# Patient Record
Sex: Female | Born: 1970 | Hispanic: No | Marital: Married | Smoking: Never smoker
Health system: Southern US, Community
[De-identification: ages and names within clinical notes are randomized; demographics above are authoritative.]

## PROBLEM LIST (undated history)

## (undated) DIAGNOSIS — E785 Hyperlipidemia, unspecified: Secondary | ICD-10-CM

## (undated) DIAGNOSIS — I2510 Atherosclerotic heart disease of native coronary artery without angina pectoris: Secondary | ICD-10-CM

## (undated) DIAGNOSIS — G4730 Sleep apnea, unspecified: Secondary | ICD-10-CM

## (undated) DIAGNOSIS — I10 Essential (primary) hypertension: Secondary | ICD-10-CM

## (undated) DIAGNOSIS — E041 Nontoxic single thyroid nodule: Secondary | ICD-10-CM

## (undated) DIAGNOSIS — T7840XA Allergy, unspecified, initial encounter: Secondary | ICD-10-CM

## (undated) DIAGNOSIS — I214 Non-ST elevation (NSTEMI) myocardial infarction: Secondary | ICD-10-CM

## (undated) DIAGNOSIS — Z95820 Peripheral vascular angioplasty status with implants and grafts: Secondary | ICD-10-CM

## (undated) HISTORY — DX: Allergy, unspecified, initial encounter: T78.40XA

## (undated) HISTORY — DX: Essential (primary) hypertension: I10

## (undated) HISTORY — DX: Sleep apnea, unspecified: G47.30

## (undated) HISTORY — DX: Hyperlipidemia, unspecified: E78.5

---

## 2003-04-15 ENCOUNTER — Encounter (INDEPENDENT_AMBULATORY_CARE_PROVIDER_SITE_OTHER): Payer: Self-pay | Admitting: Internal Medicine

## 2005-05-05 ENCOUNTER — Ambulatory Visit: Payer: Self-pay | Admitting: Internal Medicine

## 2005-05-11 ENCOUNTER — Encounter (INDEPENDENT_AMBULATORY_CARE_PROVIDER_SITE_OTHER): Payer: Self-pay | Admitting: Internal Medicine

## 2005-05-15 ENCOUNTER — Encounter (INDEPENDENT_AMBULATORY_CARE_PROVIDER_SITE_OTHER): Payer: Self-pay | Admitting: Internal Medicine

## 2005-05-15 LAB — CONVERTED CEMR LAB
BUN: 15 mg/dL
CO2: 23 meq/L
Chloride: 101 meq/L
HDL: 58 mg/dL
Potassium: 4.5 meq/L
Total CHOL/HDL Ratio: 4.4
Triglycerides: 169 mg/dL
VLDL: 34 mg/dL

## 2005-05-19 ENCOUNTER — Ambulatory Visit: Payer: Self-pay | Admitting: Internal Medicine

## 2005-06-16 ENCOUNTER — Ambulatory Visit: Payer: Self-pay | Admitting: Internal Medicine

## 2005-09-22 ENCOUNTER — Ambulatory Visit: Payer: Self-pay | Admitting: Internal Medicine

## 2005-12-08 ENCOUNTER — Encounter (INDEPENDENT_AMBULATORY_CARE_PROVIDER_SITE_OTHER): Payer: Self-pay | Admitting: Pathology

## 2005-12-08 ENCOUNTER — Ambulatory Visit: Payer: Self-pay | Admitting: Internal Medicine

## 2005-12-08 ENCOUNTER — Other Ambulatory Visit: Admission: RE | Admit: 2005-12-08 | Discharge: 2005-12-08 | Payer: Self-pay | Admitting: Internal Medicine

## 2006-01-29 ENCOUNTER — Ambulatory Visit: Payer: Self-pay | Admitting: Internal Medicine

## 2006-03-05 ENCOUNTER — Encounter: Payer: Self-pay | Admitting: Internal Medicine

## 2006-03-05 DIAGNOSIS — I10 Essential (primary) hypertension: Secondary | ICD-10-CM | POA: Insufficient documentation

## 2006-03-05 DIAGNOSIS — K589 Irritable bowel syndrome without diarrhea: Secondary | ICD-10-CM

## 2006-03-05 DIAGNOSIS — N6019 Diffuse cystic mastopathy of unspecified breast: Secondary | ICD-10-CM

## 2006-08-02 ENCOUNTER — Encounter (INDEPENDENT_AMBULATORY_CARE_PROVIDER_SITE_OTHER): Payer: Self-pay | Admitting: Internal Medicine

## 2006-08-06 ENCOUNTER — Ambulatory Visit: Payer: Self-pay | Admitting: Internal Medicine

## 2006-08-06 DIAGNOSIS — G4700 Insomnia, unspecified: Secondary | ICD-10-CM | POA: Insufficient documentation

## 2006-08-06 DIAGNOSIS — R519 Headache, unspecified: Secondary | ICD-10-CM | POA: Insufficient documentation

## 2006-08-06 DIAGNOSIS — R51 Headache: Secondary | ICD-10-CM

## 2006-08-31 ENCOUNTER — Ambulatory Visit: Payer: Self-pay | Admitting: Internal Medicine

## 2006-08-31 DIAGNOSIS — R0789 Other chest pain: Secondary | ICD-10-CM | POA: Insufficient documentation

## 2006-09-01 ENCOUNTER — Encounter (INDEPENDENT_AMBULATORY_CARE_PROVIDER_SITE_OTHER): Payer: Self-pay | Admitting: Internal Medicine

## 2006-09-04 LAB — CONVERTED CEMR LAB
Calcium: 9.3 mg/dL (ref 8.4–10.5)
Creatinine, Ser: 0.79 mg/dL (ref 0.40–1.20)
HDL: 54 mg/dL (ref >39)
Sodium: 139 meq/L (ref 135–145)
Total CHOL/HDL Ratio: 4.2
Triglycerides: 186 mg/dL — ABNORMAL HIGH (ref <150)

## 2006-09-10 ENCOUNTER — Ambulatory Visit (HOSPITAL_COMMUNITY): Admission: RE | Admit: 2006-09-10 | Discharge: 2006-09-10 | Payer: Self-pay | Admitting: Internal Medicine

## 2006-09-11 ENCOUNTER — Ambulatory Visit: Payer: Self-pay | Admitting: Cardiology

## 2006-09-28 ENCOUNTER — Telehealth (INDEPENDENT_AMBULATORY_CARE_PROVIDER_SITE_OTHER): Payer: Self-pay | Admitting: *Deleted

## 2006-10-01 ENCOUNTER — Encounter (INDEPENDENT_AMBULATORY_CARE_PROVIDER_SITE_OTHER): Payer: Self-pay | Admitting: Internal Medicine

## 2006-10-01 ENCOUNTER — Encounter (INDEPENDENT_AMBULATORY_CARE_PROVIDER_SITE_OTHER): Payer: Self-pay | Admitting: Family Medicine

## 2006-10-03 ENCOUNTER — Telehealth (INDEPENDENT_AMBULATORY_CARE_PROVIDER_SITE_OTHER): Payer: Self-pay | Admitting: *Deleted

## 2006-10-03 ENCOUNTER — Encounter (INDEPENDENT_AMBULATORY_CARE_PROVIDER_SITE_OTHER): Payer: Self-pay | Admitting: Internal Medicine

## 2006-10-17 ENCOUNTER — Telehealth (INDEPENDENT_AMBULATORY_CARE_PROVIDER_SITE_OTHER): Payer: Self-pay | Admitting: Internal Medicine

## 2006-10-24 ENCOUNTER — Encounter (INDEPENDENT_AMBULATORY_CARE_PROVIDER_SITE_OTHER): Payer: Self-pay | Admitting: Internal Medicine

## 2006-10-25 ENCOUNTER — Encounter (INDEPENDENT_AMBULATORY_CARE_PROVIDER_SITE_OTHER): Payer: Self-pay | Admitting: Family Medicine

## 2006-12-26 ENCOUNTER — Telehealth (INDEPENDENT_AMBULATORY_CARE_PROVIDER_SITE_OTHER): Payer: Self-pay | Admitting: Internal Medicine

## 2007-01-16 ENCOUNTER — Ambulatory Visit: Payer: Self-pay | Admitting: Internal Medicine

## 2007-01-16 ENCOUNTER — Telehealth (INDEPENDENT_AMBULATORY_CARE_PROVIDER_SITE_OTHER): Payer: Self-pay | Admitting: *Deleted

## 2007-01-21 ENCOUNTER — Telehealth (INDEPENDENT_AMBULATORY_CARE_PROVIDER_SITE_OTHER): Payer: Self-pay | Admitting: Internal Medicine

## 2007-01-21 ENCOUNTER — Encounter (INDEPENDENT_AMBULATORY_CARE_PROVIDER_SITE_OTHER): Payer: Self-pay | Admitting: Internal Medicine

## 2007-02-04 ENCOUNTER — Telehealth (INDEPENDENT_AMBULATORY_CARE_PROVIDER_SITE_OTHER): Payer: Self-pay | Admitting: *Deleted

## 2007-03-27 ENCOUNTER — Encounter (INDEPENDENT_AMBULATORY_CARE_PROVIDER_SITE_OTHER): Payer: Self-pay | Admitting: Internal Medicine

## 2007-04-24 ENCOUNTER — Ambulatory Visit: Payer: Self-pay | Admitting: Internal Medicine

## 2007-04-24 LAB — CONVERTED CEMR LAB
Bilirubin Urine: NEGATIVE
Blood in Urine, dipstick: NEGATIVE
Glucose, Urine, Semiquant: NEGATIVE
Ketones, urine, test strip: NEGATIVE
Nitrite: NEGATIVE
Specific Gravity, Urine: 1.02
Urobilinogen, UA: 0.2
pH: 7

## 2007-04-25 ENCOUNTER — Telehealth (INDEPENDENT_AMBULATORY_CARE_PROVIDER_SITE_OTHER): Payer: Self-pay | Admitting: *Deleted

## 2007-04-25 LAB — CONVERTED CEMR LAB
BUN: 11 mg/dL (ref 6–23)
CO2: 23 meq/L (ref 19–32)
Chloride: 103 meq/L (ref 96–112)
Creatinine, Ser: 0.59 mg/dL (ref 0.40–1.20)
Glucose, Bld: 75 mg/dL (ref 70–99)
HDL: 55 mg/dL (ref >39)
LDL Cholesterol: 120 mg/dL — ABNORMAL HIGH (ref 0–99)

## 2007-10-31 ENCOUNTER — Ambulatory Visit: Payer: Self-pay | Admitting: Family Medicine

## 2007-10-31 DIAGNOSIS — H60399 Other infective otitis externa, unspecified ear: Secondary | ICD-10-CM | POA: Insufficient documentation

## 2008-03-26 ENCOUNTER — Telehealth (INDEPENDENT_AMBULATORY_CARE_PROVIDER_SITE_OTHER): Payer: Self-pay | Admitting: Internal Medicine

## 2008-03-30 ENCOUNTER — Ambulatory Visit: Payer: Self-pay | Admitting: Internal Medicine

## 2008-05-07 ENCOUNTER — Encounter (INDEPENDENT_AMBULATORY_CARE_PROVIDER_SITE_OTHER): Payer: Self-pay | Admitting: Internal Medicine

## 2008-07-15 ENCOUNTER — Ambulatory Visit: Payer: Self-pay | Admitting: Internal Medicine

## 2008-07-15 ENCOUNTER — Telehealth (INDEPENDENT_AMBULATORY_CARE_PROVIDER_SITE_OTHER): Payer: Self-pay | Admitting: Internal Medicine

## 2008-07-15 DIAGNOSIS — R609 Edema, unspecified: Secondary | ICD-10-CM | POA: Insufficient documentation

## 2008-07-15 LAB — CONVERTED CEMR LAB
Glucose, Urine, Semiquant: NEGATIVE
Specific Gravity, Urine: 1.02
pH: 7

## 2008-07-16 LAB — CONVERTED CEMR LAB
ALT: 48 units/L — ABNORMAL HIGH (ref 0–35)
AST: 40 units/L — ABNORMAL HIGH (ref 0–37)
Calcium: 9.8 mg/dL (ref 8.4–10.5)
Chloride: 103 meq/L (ref 96–112)
Creatinine, Ser: 0.6 mg/dL (ref 0.40–1.20)
Sodium: 139 meq/L (ref 135–145)
Total Bilirubin: 0.5 mg/dL (ref 0.3–1.2)

## 2008-09-09 ENCOUNTER — Encounter (INDEPENDENT_AMBULATORY_CARE_PROVIDER_SITE_OTHER): Payer: Self-pay | Admitting: Internal Medicine

## 2008-09-10 ENCOUNTER — Encounter (INDEPENDENT_AMBULATORY_CARE_PROVIDER_SITE_OTHER): Payer: Self-pay | Admitting: Internal Medicine

## 2008-09-10 DIAGNOSIS — R944 Abnormal results of kidney function studies: Secondary | ICD-10-CM

## 2008-09-10 LAB — CONVERTED CEMR LAB
Creatinine 24 HR UR: 234 mg/24hr — ABNORMAL LOW (ref 700–1800)
Creatinine Clearance: 23 mL/min — ABNORMAL LOW (ref 75–115)

## 2008-09-16 ENCOUNTER — Encounter (INDEPENDENT_AMBULATORY_CARE_PROVIDER_SITE_OTHER): Payer: Self-pay | Admitting: Internal Medicine

## 2008-09-23 ENCOUNTER — Ambulatory Visit (HOSPITAL_COMMUNITY): Admission: RE | Admit: 2008-09-23 | Discharge: 2008-09-23 | Payer: Self-pay | Admitting: Nephrology

## 2008-12-17 ENCOUNTER — Encounter (INDEPENDENT_AMBULATORY_CARE_PROVIDER_SITE_OTHER): Payer: Self-pay | Admitting: Internal Medicine

## 2009-02-08 ENCOUNTER — Ambulatory Visit: Payer: Self-pay | Admitting: Family Medicine

## 2009-02-08 DIAGNOSIS — G4730 Sleep apnea, unspecified: Secondary | ICD-10-CM | POA: Insufficient documentation

## 2009-02-08 DIAGNOSIS — E669 Obesity, unspecified: Secondary | ICD-10-CM | POA: Insufficient documentation

## 2009-02-08 DIAGNOSIS — F329 Major depressive disorder, single episode, unspecified: Secondary | ICD-10-CM

## 2009-02-09 ENCOUNTER — Telehealth: Payer: Self-pay | Admitting: Family Medicine

## 2009-03-10 ENCOUNTER — Ambulatory Visit: Payer: Self-pay | Admitting: Family Medicine

## 2009-03-12 LAB — CONVERTED CEMR LAB
CO2: 23 meq/L (ref 19–32)
Chloride: 102 meq/L (ref 96–112)
HDL: 56 mg/dL (ref >39)
LDL Cholesterol: 117 mg/dL — ABNORMAL HIGH (ref 0–99)
Lymphs Abs: 2.9 10*3/uL (ref 0.7–4.0)
Monocytes Relative: 6 % (ref 3–12)
Neutro Abs: 7 10*3/uL (ref 1.7–7.7)
Neutrophils Relative %: 64 % (ref 43–77)
RBC: 4.87 M/uL (ref 3.87–5.11)
Sodium: 138 meq/L (ref 135–145)
TSH: 2.644 microintl units/mL (ref 0.350–4.500)
Total CHOL/HDL Ratio: 3.8
WBC: 10.9 10*3/uL — ABNORMAL HIGH (ref 4.0–10.5)

## 2009-03-15 ENCOUNTER — Encounter (INDEPENDENT_AMBULATORY_CARE_PROVIDER_SITE_OTHER): Payer: Self-pay | Admitting: Internal Medicine

## 2009-03-15 ENCOUNTER — Ambulatory Visit (HOSPITAL_COMMUNITY): Admission: RE | Admit: 2009-03-15 | Discharge: 2009-03-15 | Payer: Self-pay | Admitting: Family Medicine

## 2009-03-21 DIAGNOSIS — E785 Hyperlipidemia, unspecified: Secondary | ICD-10-CM | POA: Insufficient documentation

## 2009-03-22 ENCOUNTER — Ambulatory Visit: Payer: Self-pay | Admitting: Family Medicine

## 2009-04-29 ENCOUNTER — Ambulatory Visit: Payer: Self-pay | Admitting: Family Medicine

## 2009-04-29 ENCOUNTER — Other Ambulatory Visit: Admission: RE | Admit: 2009-04-29 | Discharge: 2009-04-29 | Payer: Self-pay | Admitting: Family Medicine

## 2009-05-03 DIAGNOSIS — T8339XA Other mechanical complication of intrauterine contraceptive device, initial encounter: Secondary | ICD-10-CM | POA: Insufficient documentation

## 2009-05-18 ENCOUNTER — Ambulatory Visit: Payer: Self-pay | Admitting: Family Medicine

## 2009-05-18 DIAGNOSIS — J018 Other acute sinusitis: Secondary | ICD-10-CM

## 2009-09-08 ENCOUNTER — Encounter: Payer: Self-pay | Admitting: Family Medicine

## 2009-09-22 ENCOUNTER — Encounter: Payer: Self-pay | Admitting: Family Medicine

## 2009-09-23 LAB — CONVERTED CEMR LAB
AST: 42 units/L — ABNORMAL HIGH (ref 0–37)
Alkaline Phosphatase: 87 units/L (ref 39–117)
BUN: 15 mg/dL (ref 6–23)
Basophils Relative: 1 % (ref 0–1)
Bilirubin, Direct: 0.1 mg/dL (ref 0.0–0.3)
CO2: 24 meq/L (ref 19–32)
Calcium: 9.7 mg/dL (ref 8.4–10.5)
Creatinine, Ser: 0.73 mg/dL (ref 0.40–1.20)
Eosinophils Absolute: 0.3 10*3/uL (ref 0.0–0.7)
Eosinophils Relative: 2 % (ref 0–5)
Glucose, Bld: 92 mg/dL (ref 70–99)
HDL: 59 mg/dL (ref >39)
Indirect Bilirubin: 0.6 mg/dL (ref 0.0–0.9)
MCHC: 33.3 g/dL (ref 30.0–36.0)
MCV: 86.8 fL (ref 78.0–100.0)
Monocytes Relative: 5 % (ref 3–12)
Neutrophils Relative %: 68 % (ref 43–77)
RBC: 5.09 M/uL (ref 3.87–5.11)
Total Bilirubin: 0.7 mg/dL (ref 0.3–1.2)

## 2009-09-30 ENCOUNTER — Ambulatory Visit: Payer: Self-pay | Admitting: Family Medicine

## 2009-09-30 DIAGNOSIS — R748 Abnormal levels of other serum enzymes: Secondary | ICD-10-CM | POA: Insufficient documentation

## 2009-10-05 LAB — CONVERTED CEMR LAB
BUN: 16 mg/dL (ref 6–23)
CO2: 26 meq/L (ref 19–32)
Calcium: 9.9 mg/dL (ref 8.4–10.5)
Chloride: 99 meq/L (ref 96–112)
Creatinine, Ser: 0.7 mg/dL (ref 0.40–1.20)
Glucose, Bld: 89 mg/dL (ref 70–99)

## 2009-12-21 ENCOUNTER — Encounter: Payer: Self-pay | Admitting: Family Medicine

## 2010-01-24 ENCOUNTER — Ambulatory Visit: Payer: Self-pay | Admitting: Family Medicine

## 2010-04-27 ENCOUNTER — Ambulatory Visit: Admit: 2010-04-27 | Payer: Self-pay | Admitting: Family Medicine

## 2010-04-27 ENCOUNTER — Encounter: Payer: Self-pay | Admitting: Family Medicine

## 2010-05-01 ENCOUNTER — Encounter: Payer: Self-pay | Admitting: Family Medicine

## 2010-05-10 NOTE — Miscellaneous (Signed)
  Clinical Lists Changes  Medications: Removed medication of DIOVAN HCT 320-25 MG TABS (VALSARTAN-HYDROCHLOROTHIAZIDE) Take 1 tablet by mouth once a day Added new medication of MAXZIDE 75-50 MG TABS (TRIAMTERENE-HCTZ) Take 1 tablet by mouth once a day - Signed Rx of MAXZIDE 75-50 MG TABS (TRIAMTERENE-HCTZ) Take 1 tablet by mouth once a day;  #90 x 0;  Signed;  Entered by: Syliva Overman MD;  Authorized by: Syliva Overman MD;  Method used: Electronically to [facility]. [phone]*, [address] [place], [place], [place]  [address], Ph: [phone] or [phone], Fax: [phone]    Prescriptions: MAXZIDE 75-50 MG TABS (TRIAMTERENE-HCTZ) Take 1 tablet by mouth once a day  #90 x 0   Entered and Authorized by:   Syliva Overman MD   Signed by:   Syliva Overman MD on 12/21/2009   Method used:   Electronically to        [facility] [phone]* (retail)       [address]       [place], [place]  [address]       Ph: [phone] or [phone]       Fax: [phone]   RxID:   [id number]

## 2010-05-10 NOTE — Assessment & Plan Note (Signed)
Summary: physical   Vital Signs:  Patient profile:   40 year old female Menstrual status:  irregular, bleeds q 2 weeks Height:      61 inches Weight:      183.25 pounds BMI:     34.75 O2 Sat:      98 % Pulse rate:   76 / minute Pulse rhythm:   regular Resp:     16 per minute BP sitting:   148 / 94 Cuff size:   large  Vitals Entered By: Everitt Amber (April 29, 2009 8:56 AM)  Nutrition Counseling: Patient's BMI is greater than 25 and therefore counseled on weight management options. CC: CPE Is Patient Diabetic? No  Vision Screening:Left eye w/o correction: 20 / 15 Right Eye w/o correction: 20 / 15 Both eyes w/o correction:  20/ 15  Color vision testing: normal      Vision Entered By: Everitt Amber (April 29, 2009 9:01 AM)   Primary Care Provider:  Syliva Overman MD  CC:  CPE.  History of Present Illness: Reports  that she has been doing well. Denies recent fever or chills. Denies sinus pressure, nasal congestion , ear pain or sore throat. Denies chest congestion, or cough productive of sputum. Denies chest pain, palpitations, PND, orthopnea or leg swelling. Denies abdominal pain, nausea, vomitting, diarrhea or constipation. Denies change in bowel movements or bloody stool. Denies dysuria , frequency, incontinence or hesitancy. Denies  joint pain, swelling, or reduced mobility. Denies headaches, vertigo, seizures. Denies depression, anxiety or insomnia. Denies  rash, lesions, or itch.     Current Medications (verified): 1)  Tribenzor 40-5-12.5 Mg Tabs (Olmesartan-Amlodipine-Hctz) .... Take 1 Tablet By Mouth Once A Day  Allergies (verified): No Known Drug Allergies  Review of Systems      See HPI General:  Denies chills and fever. Eyes:  Denies blurring and discharge. ENT:  Denies earache, hoarseness, nasal congestion, sinus pressure, and sore throat. Derm:  Denies dryness, lesion(s), and rash. Heme:  Denies abnormal bruising and bleeding. Allergy:   Denies hives or rash and sneezing.  Physical Exam  General:  Well-developed,obese,in no acute distress; alert,appropriate and cooperative throughout examination Head:  Normocephalic and atraumatic without obvious abnormalities. No apparent alopecia or balding. Eyes:  No corneal or conjunctival inflammation noted. EOMI. Perrla. Funduscopic exam benign, without hemorrhages, exudates or papilledema. Vision grossly normal. Ears:  External ear exam shows no significant lesions or deformities.  Otoscopic examination reveals clear canals, tympanic membranes are intact bilaterally without bulging, retraction, inflammation or discharge. Hearing is grossly normal bilaterally. Nose:  External nasal examination shows no deformity or inflammation. Nasal mucosa are pink and moist without lesions or exudates. Mouth:  Oral mucosa and oropharynx without lesions or exudates.  Teeth in good repair. Neck:  No deformities, masses, or tenderness noted. Chest Wall:  No deformities, masses, or tenderness noted. Breasts:  No mass, nodules, thickening, tenderness, bulging, retraction, inflamation, nipple discharge or skin changes noted.   Lungs:  Normal respiratory effort, chest expands symmetrically. Lungs are clear to auscultation, no crackles or wheezes. Heart:  Normal rate and regular rhythm. S1 and S2 normal without gallop, murmur, click, rub or other extra sounds. Abdomen:  Bowel sounds positive,abdomen soft and non-tender without masses, organomegaly or hernias noted. Rectal:  No external abnormalities noted. Normal sphincter tone. No rectal masses or tenderness.guaic neg stool Genitalia:  Normal introitus for age, no external lesions, no vaginal discharge, mucosa pink and moist, no vaginal or cervical lesions, no vaginal atrophy, no friaility  or hemorrhage, normal uterus size and position, no adnexal masses or tenderness  iUD partial;ly extruded Msk:  No deformity or scoliosis noted of thoracic or lumbar spine.     Pulses:  R and L carotid,radial,femoral,dorsalis pedis and posterior tibial pulses are full and equal bilaterally Extremities:  No clubbing, cyanosis, edema, or deformity noted with normal full range of motion of all joints.   Neurologic:  No cranial nerve deficits noted. Station and gait are normal. Plantar reflexes are down-going bilaterally. DTRs are symmetrical throughout. Sensory, motor and coordinative functions appear intact. Skin:  Intact without suspicious lesions or rashes Cervical Nodes:  No lymphadenopathy noted Axillary Nodes:  No palpable lymphadenopathy Inguinal Nodes:  No significant adenopathy Psych:  Cognition and judgment appear intact. Alert and cooperative with normal attention span and concentration. No apparent delusions, illusions, hallucinations   Impression & Recommendations:  Problem # 1:  SPECIAL SCREENING FOR MALIGNANT NEOPLASMS COLON (ICD-V76.51) Assessment Comment Only  Orders: Hemoccult Guaiac-1 spec.(in office) (82270) guaic neg stool  Problem # 2:  ROUTINE GYNECOLOGICAL EXAMINATION (ICD-V72.31) Assessment: Comment Only  Orders: Pap Smear ([address])  Problem # 3:  OBESITY, UNSPECIFIED (ICD-278.00) Assessment: Unchanged  Ht: 61 (04/29/2009)   Wt: 183.25 (04/29/2009)   BMI: 34.75 (04/29/2009)  Problem # 4:  DEPRESSION (ICD-311) Assessment: Improved  Problem # 5:  HYPERTENSION (ICD-401.9) Assessment: Improved  Her updated medication list for this problem includes:    Tribenzor 40-5-12.5 Mg Tabs (Olmesartan-amlodipine-hctz) .Marland Kitchen... Take 1 tablet by mouth once a day    Clonidine Hcl 0.1 Mg Tabs (Clonidine hcl) .Marland Kitchen... 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 Take 1 tablet by mouth once a day 2)  Clonidine Hcl 0.1 Mg Tabs (Clonidine hcl) .... Take 1 tab by mouth at bedtime  Other Orders: T-Lipid Profile ([id number])  Patient Instructions: 1)  Please schedule a follow-up appointment in 4 months. 2)  It is important that you exercise regularly at least 20 minutes 5 times a week. If you develop chest pain, have severe difficulty breathing, or feel very tired , stop exercising immediately and seek medical attention. 3)  You need to lose weight. Consider a lower calorie diet and regular exercise.  4)  Lipid Panel prior to visit, ICD-9:  fasting labs  in 4 months 5)  BMP prior to visit, ICD-9: Prescriptions: CLONIDINE HCL 0.1 MG TABS (CLONIDINE HCL) Take 1 tab by mouth at bedtime  #30 x 4   Entered and Authorized by:   Syliva Overman MD   Signed by:   Syliva Overman MD on 04/29/2009   Method used:   Electronically to        [facility]* (retail)       [address]       [place], [place]  [address]       Ph: [phone] or [phone]       Fax: [phone]   RxID:   [id number]   Laboratory Results  Stool - Occult Blood Hemmoccult #1: negative Date: 04/29/2009 Comments: [id number] 9R 8/11 118 10/12

## 2010-05-10 NOTE — Assessment & Plan Note (Signed)
Summary: office visit   Vital Signs:  Patient profile:   40 year old female Menstrual status:  irregular, bleeds q 2 weeks Height:      61 inches Weight:      182.75 pounds BMI:     34.66 O2 Sat:      96 % Pulse rate:   75 / minute Pulse rhythm:   regular Resp:     16 per minute BP sitting:   140 / 98  (left arm) Cuff size:   large  Nutrition Counseling: Patient's BMI is greater than 25 and therefore counseled on weight management options. CC: Follow up chronic problems    Primary Care Provider:  Syliva Overman MD  CC:  Follow up chronic problems .  History of Present Illness: Excessive daytime sleepiness persits and fatigue. Intolerany of maxzide cramps not taking  daily  Denies recent fever or chills. Denies sinus pressure, nasal congestion , ear pain or sore throat. Denies chest congestion, or cough productive of sputum. Denies chest pain, palpitations, PND, orthopnea or leg swelling. Denies abdominal pain, nausea, vomitting, diarrhea or constipation. Denies change in bowel movements or bloody stool. Denies dysuria , frequency, incontinence or hesitancy. Denies  joint pain, swelling, or reduced mobility. Denies headaches, vertigo, seizures. Denies depression, anxiety or insomnia. Denies  rash, lesions, or itch. No regular exercise, no commitment to regular exercise, no weight change. Here to review blood pressure and recent labs, which show worsening in her liupid status and abn lFT's. She recently ahd a procedure on her toenail and has leukocytosis with a shift, antibiotics will be prescribed. There is a question of prediabetes, she cleaRLY HAS THE METABOLIC SYND, Hbsa1c TO BE CHECKED      Current Medications (verified): 1)  Maxzide 75-50 Mg Tabs (Triamterene-Hctz) .... Take 1 Tablet By Mouth Once A Day 2)  Amlodipine Besylate 10 Mg Tabs (Amlodipine Besylate) .... Take 1 Tablet By Mouth Once A Day  Allergies (verified): No Known Drug Allergies  Review of  Systems      See HPI General:  Complains of fatigue. Eyes:  Denies blurring and discharge. Endo:  Denies cold intolerance, excessive thirst, excessive urination, and heat intolerance. Heme:  Denies abnormal bruising and bleeding. Allergy:  Denies hives or rash and itching eyes.  Physical Exam  General:  Well-developed,well-nourished,in no acute distress; alert,appropriate and cooperative throughout examination HEENT: No facial asymmetry,  EOMI, No sinus tenderness, TM's Clear, oropharynx  pink and moist.   Chest: Clear to auscultation bilaterally.  CVS: S1, S2, No murmurs, No S3.   Abd: Soft, Nontender.  MS: Adequate ROM spine, hips, shoulders and knees.  Ext: No edema.   CNS: CN 2-12 intact, power tone and sensation normal throughout.   Skin: Intact, no visible lesions or rashes.  Psych: Good eye contact, normal affect.  Memory intact, not anxious or depressed appearing.     Impression & Recommendations:  Problem # 1:  OTHER NONSPECIFIC ABNORMAL SERUM ENZYME LEVELS (ICD-790.5) Assessment Comment Only  Orders: T-Hepatitis Profile Acute ([id number]) Gastroenterology Referral (GI) Radiology Referral (Radiology)  Problem # 2:  HYPERLIPIDEMIA (ICD-272.4) Assessment: Deteriorated  Labs Reviewed: SGOT: 42 (09/22/2009)   SGPT: 51 (09/22/2009)  Prior 10 Yr Risk Heart Disease: 2 % (10/31/2007)   HDL:59 (09/22/2009), 56 (03/10/2009)  LDL:128 (09/22/2009), 117 (03/10/2009)  Chol:224 (09/22/2009), 215 (03/10/2009)  Trig:185 (09/22/2009), 212 (03/10/2009) the importance of a low fAT DIIET ANDWEIGHT REDUCTION WAS STRESSED, PT LIKELY HAS FATTY LIVER DS  Problem # 3:  OBESITY, UNSPECIFIED (  ICD-278.00) Assessment: Unchanged  Ht: 61 (09/30/2009)   Wt: 182.75 (09/30/2009)   BMI: 34.66 (09/30/2009)  Problem # 4:  HYPERTENSION (ICD-401.9) Assessment: Comment Only  The following medications were removed from the medication list:    Maxzide 75-50 Mg Tabs (Triamterene-hctz) .Marland Kitchen... Take  1 tablet by mouth once a day Her updated medication list for this problem includes:    Amlodipine Besylate 10 Mg Tabs (Amlodipine besylate) .Marland Kitchen... Take 1 tablet by mouth once a day    Diovan Hct 320-25 Mg Tabs (Valsartan-hydrochlorothiazide) .Marland Kitchen... Take 1 tablet by mouth once a day  Orders: T-Basic Metabolic Panel [id number])  BP today: 140/98, UNCHANGED, INTOLERANT OF MAXZIDE, CAUSED CRAMPS Prior BP: 150/90 (05/18/2009)  Prior 10 Yr Risk Heart Disease: 2 % (10/31/2007)  Labs Reviewed: K+: 4.3 (09/22/2009) Creat: : 0.73 (09/22/2009)   Chol: 224 (09/22/2009)   HDL: 59 (09/22/2009)   LDL: 128 (09/22/2009)   TG: 185 (09/22/2009)  Complete Medication List: 1)  Amlodipine Besylate 10 Mg Tabs (Amlodipine besylate) .... Take 1 tablet by mouth once a day 2)  Diovan Hct 320-25 Mg Tabs (Valsartan-hydrochlorothiazide) .... Take 1 tablet by mouth once a day 3)  Septra Ds 800-160 Mg Tabs (Sulfamethoxazole-trimethoprim) .... Take 1 tablet by mouth two times a day  Patient Instructions: 1)  Please schedule a follow-up appointment in 3.5 months. 2)  Complimentary BP cherck by Md in 4 weeks 3)  It is important that you exercise regularly at least 20 minutes 5 times a week. If you develop chest pain, have severe difficulty breathing, or feel very tired , stop exercising immediately and seek medical attention. 4)  You need to lose weight. Consider a lower calorie diet and regular exercise.  5)  HbgA1C prior to visit, ICD-9:  and heptitis panel  today 6)  Youn will be referred for an [place] of your liver since your enzymes are high 7)  You will be referred to a GI specialist also 8)  New med fopr your blood pressure Prescriptions: SEPTRA DS 800-160 MG TABS (SULFAMETHOXAZOLE-TRIMETHOPRIM) Take 1 tablet by mouth two times a day  #10 x 0   Entered and Authorized by:   Syliva Overman MD   Signed by:   Syliva Overman MD on 09/30/2009   Method used:   Electronically to        [facility] [phone]*  (retail)       [address]       [place], [place]  [address]       Ph: [phone] or [phone]       Fax: [phone]   RxID:   [id number] DIOVAN HCT 320-25 MG TABS (VALSARTAN-HYDROCHLOROTHIAZIDE) Take 1 tablet by mouth once a day  #90 x 1   Entered and Authorized by:   Syliva Overman MD   Signed by:   Syliva Overman MD on 09/30/2009   Method used:   Print then Give to Patient   RxID:   [id number]

## 2010-05-10 NOTE — Progress Notes (Signed)
Summary: [facility]  [facility]   Imported By: Lind Guest 09/15/2009 09:34:32  _____________________________________________________________________  External Attachment:    Type:   Image     Comment:   External Document

## 2010-05-10 NOTE — Assessment & Plan Note (Signed)
Summary: F UP   Vital Signs:  Patient profile:   40 year old female Menstrual status:  irregular, bleeds q 2 weeks Height:      61 inches Weight:      165.75 pounds BMI:     31.43 O2 Sat:      96 % on Room air Pulse rate:   64 / minute Pulse rhythm:   regular Resp:     16 per minute BP sitting:   200 / 110  (left arm)  Vitals Entered By: Adella Hare LPN (January 24, 2010 8:16 AM)  Nutrition Counseling: Patient's BMI is greater than 25 and therefore counseled on weight management options.  O2 Flow:  Room air CC: follow-up visit Is Patient Diabetic? No Pain Assessment Patient in pain? no        Primary Care Provider:  Syliva Overman MD  CC:  follow-up visit.  History of Present Illness: Reports  that she has been doing very well. She has changed her lifestyle and is 18 pounds lighter. She however  Denies recent fever or chills. Denies sinus pressure, nasal congestion , ear pain or sore throat. Denies chest congestion, or cough productive of sputum. Denies chest pain, palpitations, PND, orthopnea or leg swelling. Denies abdominal pain, nausea, vomitting, diarrhea or constipation. Denies change in bowel movements or bloody stool. Denies dysuria , frequency, incontinence or hesitancy.  Denies headaches, vertigo, seizures. Denies depression, anxiety or insomnia. Denies  rash, lesions, or itch.     Allergies (verified): No Known Drug Allergies  Review of Systems      See HPI Eyes:  Denies blurring and discharge. MS:  Complains of joint pain, muscle weakness, and stiffness; right hand pain , tingling andf numbness over the past several; weeks, sometimes awakens pt. Endo:  Denies excessive hunger, excessive thirst, excessive urination, and polyuria. Heme:  Denies abnormal bruising and bleeding. Allergy:  Denies hives or rash and itching eyes.  Physical Exam  General:  Well-developed,well-nourished,in no acute distress; alert,appropriate and cooperative  throughout examination HEENT: No facial asymmetry,  EOMI, No sinus tenderness, TM's Clear, oropharynx  pink and moist.   Chest: Clear to auscultation bilaterally.  CVS: S1, S2, No murmurs, No S3.   Abd: Soft, Nontender.  MS: Adequate ROM spine, hips, shoulders and knees. wasting of right thenar eminence with positive tinnel's Ext: No edema.   CNS: CN 2-12 intact, power tone and sensation normal throughout.   Skin: Intact, no visible lesions or rashes.  Psych: Good eye contact, normal affect.  Memory intact, not anxious or depressed appearing.    Impression & Recommendations:  Problem # 1:  HYPERLIPIDEMIA (ICD-272.4) Assessment Comment Only  Orders: T-Lipid Profile ([id number]) T-Hepatic Function [id number]) Low fat dietdiscussed and encouraged  Labs Reviewed: SGOT: 42 (09/22/2009)   SGPT: 51 (09/22/2009)  Prior 10 Yr Risk Heart Disease: 2 % (10/31/2007)   HDL:59 (09/22/2009), 56 (03/10/2009)  LDL:128 (09/22/2009), 117 (03/10/2009)  Chol:224 (09/22/2009), 215 (03/10/2009)  Trig:185 (09/22/2009), 212 (03/10/2009)  Problem # 2:  OBESITY, UNSPECIFIED (ICD-278.00) Assessment: Improved  Ht: 61 (01/24/2010)   Wt: 165.75 (01/24/2010)   BMI: 31.43 (01/24/2010) therapeutic lifestyle change discussed and encouraged  Problem # 3:  HYPERTENSION (ICD-401.9) Assessment: Deteriorated  The following medications were removed from the medication list:    Maxzide 75-50 Mg Tabs (Triamterene-hctz) .Marland Kitchen... Take 1 tablet by mouth once a day Her updated medication list for this problem includes:    Amlodipine Besylate 10 Mg Tabs (Amlodipine besylate) .Marland Kitchen... Take 1 tablet by  mouth once a day    Maxzide 75-50 Mg Tabs (Triamterene-hctz) .Marland Kitchen... Take 1 tablet by mouth once a day med compliance stressed, she understands Orders: T-Basic Metabolic Panel [id number])  BP today: 200/110 Prior BP: 140/98 (09/30/2009)  Prior 10 Yr Risk Heart Disease: 2 % (10/31/2007)  Labs Reviewed: K+: 3.6  (09/30/2009) Creat: : 0.70 (09/30/2009)   Chol: 224 (09/22/2009)   HDL: 59 (09/22/2009)   LDL: 128 (09/22/2009)   TG: 185 (09/22/2009)  Problem # 4:  CARPAL TUNNEL SYNDROME, RIGHT (ICD-354.0) Assessment: Comment Only pt to use wrist brace at this time  Complete Medication List: 1)  Amlodipine Besylate 10 Mg Tabs (Amlodipine besylate) .... Take 1 tablet by mouth once a day 2)  Maxzide 75-50 Mg Tabs (Triamterene-hctz) .... Take 1 tablet by mouth once a day  Other Orders: T- Hemoglobin A1C ([id number]) Influenza Vaccine NON MCR (27253)  Patient Instructions: 1)  F/U in 6 to 8  weeks 2)  Congrats on your weight loss and new exercise, pls keep it up, I know you will!!  3)  BMP prior to visit, ICD-9: 4)  Hepatic Panel prior to visit, ICD-9: 5)  Lipid Panel prior to visit, ICD-9:   fasting in 6 yto 8 weeks, approx 3 days before return 6)  HbgA1C prior to visit, ICD-9: 7)  Flu vac today Prescriptions: MAXZIDE 75-50 MG TABS (TRIAMTERENE-HCTZ) Take 1 tablet by mouth once a day  #90 x 1   Entered and Authorized by:   Syliva Overman MD   Signed by:   Syliva Overman MD on 01/24/2010   Method used:   Electronically to        [facility] [phone]* (retail)       [address]       [place], [place]  [address]       Ph: [phone] or [phone]       Fax: [phone]   RxID:   [id number]    Orders Added: 1)  Est. Patient Level IV [[address]062] 2)  T-Basic Metabolic Panel [[id number]] 3)  T-Lipid Profile [80061-22930] 4)  T-Hepatic Function [80076-22960] 5)  T- Hemoglobin A1C [83036-23375] 6)  Influenza Vaccine NON MCR [00028]   Immunizations Administered:  Influenza Vaccine # 1:    Vaccine Type: Fluvax Non-MCR    Site: left deltoid    Mfr: novartis    Dose: 0.5 ml    Route: IM    Given by: Adella Hare LPN    Exp. Date: 08/2010    Lot #: [id number]    VIS given: 11/02/09 version given January 24, 2010.   Immunizations Administered:  Influenza  Vaccine # 1:    Vaccine Type: Fluvax Non-MCR    Site: left deltoid    Mfr: novartis    Dose: 0.5 ml    Route: IM    Given by: Adella Hare LPN    Exp. Date: 08/2010    Lot #: [id number]    VIS given: 11/02/09 version given January 24, 2010.

## 2010-05-10 NOTE — Assessment & Plan Note (Signed)
Summary: OV   Vital Signs:  Patient profile:   40 year old female Menstrual status:  irregular, bleeds q 2 weeks Height:      61 inches Weight:      182.50 pounds BMI:     34.61 O2 Sat:      98 % on Room air Pulse rate:   101 / minute Pulse rhythm:   regular Resp:     16 per minute BP sitting:   150 / 90  (left arm)  Vitals Entered By: Worthy Keeler LPN (May 18, 2009 1:25 PM)  Nutrition Counseling: Patient's BMI is greater than 25 and therefore counseled on weight management options.  O2 Flow:  Room air CC: head congestion, cough, chills body aches, clear to green nasal drainage and sputum Is Patient Diabetic? No Pain Assessment Patient in pain? no        Primary Care Provider:  Syliva Overman MD  CC:  head congestion, cough, chills body aches, and clear to green nasal drainage and sputum.  History of Present Illness: 4 day h/o sinus pressure, right cheek, green drainage, chills,and minor body aCHES, AND COUGH MAINLY CLEAR SPUTUM.  Current Medications (verified): 1)  Tribenzor 40-5-12.5 Mg Tabs (Olmesartan-Amlodipine-Hctz) .... Take 1 Tablet By Mouth Once A Day 2)  Clonidine Hcl 0.1 Mg Tabs (Clonidine Hcl) .... Take 1 Tab By Mouth At Bedtime  Allergies (verified): No Known Drug Allergies  Review of Systems      See HPI General:  Complains of chills, fatigue, fever, and malaise. Eyes:  Denies blurring and discharge. ENT:  Complains of nasal congestion, postnasal drainage, and sinus pressure. CV:  Denies chest pain or discomfort, palpitations, and swelling of feet. Resp:  Complains of cough and sputum productive; clear. GI:  Denies abdominal pain, constipation, diarrhea, nausea, and vomiting. GU:  Denies dysuria and urinary frequency. MS:  Complains of joint pain and muscle aches. Derm:  Denies itching and rash. Neuro:  Complains of headaches; denies seizures and sensation of room spinning. Psych:  Denies anxiety and depression. Endo:  Denies cold  intolerance, excessive hunger, excessive thirst, excessive urination, heat intolerance, polyuria, and weight change. Allergy:  Denies hives or rash and itching eyes.  Physical Exam  General:  Well-developed,obese,in no acute distress; alert,appropriate and cooperative throughout examinationIll appearing HEENT: No facial asymmetry,  EOMI, right maxillary  sinus tenderness, TM's Clear, oropharynx  pink and moist.   Chest: Clear to auscultation bilaterally.  CVS: S1, S2, No murmurs, No S3.   Abd: Soft, Nontender.  MS: Adequate ROM spine, hips, shoulders and knees.  Ext: No edema.   CNS: CN 2-12 intact, power tone and sensation normal throughout.   Skin: Intact, no visible lesions or rashes.  Psych: Good eye contact, normal affect.  Memory intact, not anxious or depressed appearing.\par  Impression & Recommendations:  Problem # 1:  OTHER ACUTE SINUSITIS (ICD-461.8) Assessment Comment Only  Her updated medication list for this problem includes:    Septra Ds 800-160 Mg Tabs (Sulfamethoxazole-trimethoprim) .Marland Kitchen... Take 1 tablet by mouth two times a day    Tessalon Perles 100 Mg Caps (Benzonatate) .Marland Kitchen... Take 1 tablet by mouth three times a day  Orders: Depo- Medrol 80mg  (J1040) Rocephin  250mg  (Z6109) Admin of Therapeutic Inj  intramuscular or subcutaneous (60454)  Problem # 2:  HYPERLIPIDEMIA (ICD-272.4) Assessment: Comment Only  Labs Reviewed: SGOT: 40 (07/15/2008)   SGPT: 48 (07/15/2008)  Prior 10 Yr Risk Heart Disease: 2 % (10/31/2007)   HDL:56 (03/10/2009), 55 (  04/24/2007)  LDL:117 (03/10/2009), 120 (04/24/2007)  Chol:215 (03/10/2009), 203 (04/24/2007)  Trig:212 (03/10/2009), 142 (04/24/2007)low fat diet discusse and encouraged  Problem # 3:  OBESITY, UNSPECIFIED (ICD-278.00) Assessment: Unchanged  Ht: 61 (05/18/2009)   Wt: 182.50 (05/18/2009)   BMI: 34.61 (05/18/2009)  Problem # 4:  HYPERTENSION (ICD-401.9) Assessment: Unchanged  The following medications were removed from  the medication list:    Tribenzor 40-5-12.5 Mg Tabs (Olmesartan-amlodipine-hctz) .Marland Kitchen... Take 1 tablet by mouth once a day    Clonidine Hcl 0.1 Mg Tabs (Clonidine hcl) .Marland Kitchen... Take 1 tab by mouth at bedtime Her updated medication list for this problem includes:    Maxzide 75-50 Mg Tabs (Triamterene-hctz) .Marland Kitchen... Take 1 tablet by mouth once a day    Amlodipine Besylate 10 Mg Tabs (Amlodipine besylate) .Marland Kitchen... Take 1 tablet by mouth once a day  BP today: 150/90 Prior BP: 148/94 (04/29/2009)  Prior 10 Yr Risk Heart Disease: 2 % (10/31/2007)  Labs Reviewed: K+: 4.6 (03/10/2009) Creat: : 0.62 (03/10/2009)   Chol: 215 (03/10/2009)   HDL: 56 (03/10/2009)   LDL: 117 (03/10/2009)   TG: 212 (03/10/2009)  Complete Medication List: 1)  Maxzide 75-50 Mg Tabs (Triamterene-hctz) .... Take 1 tablet by mouth once a day 2)  Amlodipine Besylate 10 Mg Tabs (Amlodipine besylate) .... Take 1 tablet by mouth once a day 3)  Septra Ds 800-160 Mg Tabs (Sulfamethoxazole-trimethoprim) .... Take 1 tablet by mouth two times a day 4)  Tessalon Perles 100 Mg Caps (Benzonatate) .... Take 1 tablet by mouth three times a day 5)  Fluconazole 150 Mg Tabs (Fluconazole) .... Take 1 tablet by mouth once a day as needed  Patient Instructions: 1)  Please schedule a follow-up appointment in 2.5 months. 2)  YOU ARE BEING TREATED TODAY FOR ACUTE SINuSITIS. 3)  You will get injections in the office and meds are sent to your pharmacy. 4)  Pls drinkk alot of water. 5)  Get plenty of rest. 6)  Saline flushes at least 3 to 4 times daily. 7)  New meds for your bP when you have finished tribenzor Prescriptions: FLUCONAZOLE 150 MG TABS (FLUCONAZOLE) Take 1 tablet by mouth once a day as needed  #3 x 0   Entered and Authorized by:   Syliva Overman MD   Signed by:   Syliva Overman MD on 05/18/2009   Method used:   Print then Give to Patient   RxID:   [id number] TESSALON PERLES 100 MG CAPS (BENZONATATE) Take 1 tablet by mouth three  times a day  #30 x 0   Entered and Authorized by:   Syliva Overman MD   Signed by:   Syliva Overman MD on 05/18/2009   Method used:   Print then Give to Patient   RxID:   [id number] SEPTRA DS 800-160 MG TABS (SULFAMETHOXAZOLE-TRIMETHOPRIM) Take 1 tablet by mouth two times a day  #20 x 0   Entered and Authorized by:   Syliva Overman MD   Signed by:   Syliva Overman MD on 05/18/2009   Method used:   Print then Give to Patient   RxID:   [id number] AMLODIPINE BESYLATE 10 MG TABS (AMLODIPINE BESYLATE) Take 1 tablet by mouth once a day  #90 x 0   Entered and Authorized by:   Syliva Overman MD   Signed by:   Syliva Overman MD on 05/18/2009   Method used:   Print then Give to Patient   RxID:   [id number] MAXZIDE 75-50 MG TABS (TRIAMTERENE-HCTZ) Take 1  tablet by mouth once a day  #90 x 0   Entered and Authorized by:   Syliva Overman MD   Signed by:   Syliva Overman MD on 05/18/2009   Method used:   Print then Give to Patient   RxID:   [id number]    Medication Administration  Injection # 1:    Medication: Depo- Medrol 80mg     Diagnosis: OTHER ACUTE SINUSITIS (ICD-461.8)    Route: IM    Site: RUOQ gluteus    Exp Date: 10/11    Lot #: [id number]    Mfr: Pharm[employer]    Patient tolerated injection without complications    Given by: Worthy Keeler LPN (May 18, 2009 2:06 PM)  Injection # 2:    Medication: Rocephin  250mg     Diagnosis: OTHER ACUTE SINUSITIS (ICD-461.8)    Route: IM    Site: LUOQ gluteus    Exp Date: 4/13    Lot #: [id number]    Mfr: [email or web address]    Comments: rocephin 500mg  given    Patient tolerated injection without complications    Given by: Worthy Keeler LPN (May 18, 2009 2:07 PM)  Orders Added: 1)  Est. Patient Level IV [56213] 2)  Depo- Medrol 80mg  [J1040] 3)  Rocephin  250mg  [J0696] 4)  Admin of Therapeutic Inj  intramuscular or subcutaneous [08657]

## 2010-05-10 NOTE — Progress Notes (Signed)
Summary: [facility]   Imported By: Lind Guest 09/24/2009 08:55:31  _____________________________________________________________________  External Attachment:    Type:   Image     Comment:   External Document

## 2010-05-12 NOTE — Letter (Signed)
Summary: 1st missed letter  1st missed letter   Imported By: Lind Guest 04/29/2010 11:45:09  _____________________________________________________________________  External Attachment:    Type:   Image     Comment:   External Document

## 2010-08-26 NOTE — Procedures (Signed)
NAMEGLYNNA, Jennifer Diaz              ACCOUNT NO.:  [id number]   MEDICAL RECORD NO.:  [id number]          PATIENT TYPE:  OUT   LOCATION:  RAD                           FACILITY:  [facility]   PHYSICIAN:  Gerrit Friends. Dietrich Pates, MD, FACCDATE OF BIRTH:  Jun 20, 1970   DATE OF PROCEDURE:  09/11/2006  DATE OF DISCHARGE:                                ECHOCARDIOGRAM   REFERRING PHYSICIAN:  Erle Crocker, M.D.   CLINICAL DATA:  A 40 year old woman with chest pain; history of  hypertension.  M-mode aorta 2.4, left atrium 3.9, septum 1.3, posterior  wall 1.1, LV diastole 3.8, LV systole 2.6.   1. Technically adequate echocardiographic study.  2. Very mild left atrial enlargement; normal right atrium and right      ventricle.  3. Mild sclerosis of a trileaflet aortic valve.  4. Normal aortic root dimensions; mild calcification of the wall.  5. Normal tricuspid and pulmonic valve; normal proximal pulmonary      artery.  6. Normal mitral valve with mild annular calcification.  7. Normal left ventricular size; borderline wall thickness except for      mild hypertrophy of the proximal upper septum.  Normal regional and      global left ventricular systolic function.  8. Normal IVC.  9. No pericardial effusion.      Gerrit Friends. Dietrich Pates, MD, [facility]  Electronically Signed     RMR/MEDQ  D:  09/11/2006  T:  09/11/2006  Job:  [id number]

## 2011-01-31 ENCOUNTER — Other Ambulatory Visit: Payer: Self-pay | Admitting: Family Medicine

## 2011-01-31 DIAGNOSIS — Z139 Encounter for screening, unspecified: Secondary | ICD-10-CM

## 2011-02-07 ENCOUNTER — Ambulatory Visit (HOSPITAL_COMMUNITY)
Admission: RE | Admit: 2011-02-07 | Discharge: 2011-02-07 | Disposition: A | Discharge status: Home / Self Care | Payer: BC Managed Care – PPO | Source: Ambulatory Visit | Attending: Family Medicine | Admitting: Family Medicine

## 2011-02-07 DIAGNOSIS — Z1231 Encounter for screening mammogram for malignant neoplasm of breast: Secondary | ICD-10-CM | POA: Insufficient documentation

## 2011-02-07 DIAGNOSIS — Z139 Encounter for screening, unspecified: Secondary | ICD-10-CM

## 2011-07-03 ENCOUNTER — Encounter: Payer: Self-pay | Admitting: Family Medicine

## 2011-07-03 ENCOUNTER — Ambulatory Visit (INDEPENDENT_AMBULATORY_CARE_PROVIDER_SITE_OTHER): Payer: BC Managed Care – PPO | Admitting: Family Medicine

## 2011-07-03 VITALS — BP 210/108 | HR 64 | Resp 18 | Ht 61.0 in | Wt 174.0 lb

## 2011-07-03 DIAGNOSIS — M25559 Pain in unspecified hip: Secondary | ICD-10-CM

## 2011-07-03 DIAGNOSIS — G4730 Sleep apnea, unspecified: Secondary | ICD-10-CM

## 2011-07-03 DIAGNOSIS — E785 Hyperlipidemia, unspecified: Secondary | ICD-10-CM

## 2011-07-03 DIAGNOSIS — M25552 Pain in left hip: Secondary | ICD-10-CM

## 2011-07-03 DIAGNOSIS — E669 Obesity, unspecified: Secondary | ICD-10-CM

## 2011-07-03 DIAGNOSIS — I10 Essential (primary) hypertension: Secondary | ICD-10-CM

## 2011-07-03 MED ORDER — LISINOPRIL-HYDROCHLOROTHIAZIDE 20-12.5 MG PO TABS: 1.0000 | ORAL_TABLET | Freq: Every day | ORAL | Status: DC

## 2011-07-03 NOTE — Assessment & Plan Note (Signed)
Will need to work on weight loss and diet

## 2011-07-03 NOTE — Assessment & Plan Note (Signed)
Check FLP along with other labs , pt to have done in AM fasting

## 2011-07-03 NOTE — Assessment & Plan Note (Addendum)
Severely elevated BP, though pt asymptomatic, will start HCTZ/Lisinopril and recheck her on Wed AM. EKG shows concern for digitalsis however pt without any symptoms and on my review no true ST depression. Check CBC, CMET, TSH

## 2011-07-03 NOTE — Progress Notes (Signed)
  Subjective:    Patient ID: Jennifer Diaz, female    DOB: 07/04/1970, 41 y.o.   MRN: [id number]  HPI  Pt here to re-establish care, no PCP in past 2 years HTN- no current meds, at allergist this AM with BP 230/100's, no CP, HA, Dizziness, in the past on norvasc  Face itching and rash- seen by labuer allergy/asthma today, told contact dermatitis, had skin testing done today for allergens, started on face cream she has not picked up yet  Hip pain- left hip pain x 4 days, feels like it is bursitis, No injury, unable to lay on left side without pain  Review of Systems  GEN- denies fatigue, fever, weight loss,weakness, recent illness HEENT- denies eye drainage, change in vision, nasal discharge, CVS- denies chest pain, palpitations RESP- denies SOB, cough, wheeze ABD- denies N/V, change in stools, abd pain GU- denies dysuria, hematuria, dribbling, incontinence MSK- + joint pain, muscle aches, injury Neuro- denies headache, dizziness, syncope, seizure activity       Objective:   Physical Exam GEN- NAD, alert and oriented x3 HEENT- PERRL, EOMI, non injected sclera, pink conjunctiva, MMM, oropharynx clear, fundoscopic exam benign Neck- Supple, no thyromegaly CVS- RRR, no murmur RESP-CTAB EXT- No edema Pulses- Radial, DP- 2+ Hip- TTP along greater trochanter left side, mild pain with IR/ER, no hip dislocation, neg SLR, spine non tender,  EKG- NSR, No ST depression or elevation > 1/2 box, read as digitalis effect       Assessment & Plan:

## 2011-07-03 NOTE — Assessment & Plan Note (Signed)
Pt has never used CPAP machine

## 2011-07-03 NOTE — Patient Instructions (Addendum)
Start the blood pressure medications Get the labs drawn before the visit Ice your hip, elevate the legs, you can take no more than 600mg  of ibuprofen three times a day F/U on Wednesday for appt for blood pressure

## 2011-07-03 NOTE — Assessment & Plan Note (Signed)
Trial of NSAIDS, noted pt hypertension, ICE and rest, she declined possible steroid injection for trochanteric bursitis which this does sound like

## 2011-07-04 LAB — COMPREHENSIVE METABOLIC PANEL
AST: 22 U/L (ref 0–37)
BUN: 14 mg/dL (ref 6–23)
Calcium: 9.8 mg/dL (ref 8.4–10.5)
Chloride: 101 mEq/L (ref 96–112)
Creat: 0.71 mg/dL (ref 0.50–1.10)
Total Bilirubin: 0.6 mg/dL (ref 0.3–1.2)

## 2011-07-04 LAB — LIPID PANEL
Cholesterol: 221 mg/dL — ABNORMAL HIGH (ref 0–200)
Total CHOL/HDL Ratio: 4.2 Ratio
Triglycerides: 160 mg/dL — ABNORMAL HIGH (ref <150)

## 2011-07-04 LAB — CBC
HCT: 46.4 % — ABNORMAL HIGH (ref 36.0–46.0)
Hemoglobin: 14.9 g/dL (ref 12.0–15.0)
MCV: 84.7 fL (ref 78.0–100.0)
WBC: 11.6 10*3/uL — ABNORMAL HIGH (ref 4.0–10.5)

## 2011-07-05 ENCOUNTER — Encounter: Payer: Self-pay | Admitting: Family Medicine

## 2011-07-05 ENCOUNTER — Ambulatory Visit (INDEPENDENT_AMBULATORY_CARE_PROVIDER_SITE_OTHER): Payer: BC Managed Care – PPO | Admitting: Family Medicine

## 2011-07-05 VITALS — BP 188/100 | HR 64 | Resp 18 | Ht 61.0 in | Wt 173.1 lb

## 2011-07-05 DIAGNOSIS — Z8379 Family history of other diseases of the digestive system: Secondary | ICD-10-CM

## 2011-07-05 DIAGNOSIS — R5383 Other fatigue: Secondary | ICD-10-CM

## 2011-07-05 DIAGNOSIS — E669 Obesity, unspecified: Secondary | ICD-10-CM

## 2011-07-05 DIAGNOSIS — R51 Headache: Secondary | ICD-10-CM

## 2011-07-05 DIAGNOSIS — E785 Hyperlipidemia, unspecified: Secondary | ICD-10-CM

## 2011-07-05 DIAGNOSIS — R519 Headache, unspecified: Secondary | ICD-10-CM | POA: Insufficient documentation

## 2011-07-05 DIAGNOSIS — R5381 Other malaise: Secondary | ICD-10-CM

## 2011-07-05 DIAGNOSIS — I10 Essential (primary) hypertension: Secondary | ICD-10-CM

## 2011-07-05 MED ORDER — LISINOPRIL-HYDROCHLOROTHIAZIDE 20-12.5 MG PO TABS: 2.0000 | ORAL_TABLET | Freq: Every day | ORAL | Status: DC

## 2011-07-05 NOTE — Assessment & Plan Note (Signed)
See above

## 2011-07-05 NOTE — Assessment & Plan Note (Signed)
Our check today is improved from Monday PM. She will increase her lisinopril/HCTZ to 2 tablets to make max dose of each medication, recheck in 4 weeks.

## 2011-07-05 NOTE — Assessment & Plan Note (Signed)
Pt appears motivated to improve health, goal LDL < 120 but preferably closer to 100. She is going to work on lifestyle changes, will recheck in 6 months to see if statin needed at that time

## 2011-07-05 NOTE — Assessment & Plan Note (Signed)
These occur randomly and not severe enough for OTC meds, likely associated with HTN . Pt will log her headaches, at this time no long term meds

## 2011-07-05 NOTE — Assessment & Plan Note (Signed)
I think this is multifactorial, between her obesity, HTN and stressful job. Reviewed labs. Encouraged BP reduction, daily exercise and weight loss.

## 2011-07-05 NOTE — Progress Notes (Signed)
  Subjective:    Patient ID: Jennifer Diaz, female    DOB: [birth year], 41 y.o.   MRN: [id number]  HPI  Pt here to f/u BP, no SE with medication Occasionally has sharp headaches in occipital region that come up to frontal region, no change in vision, denies N/V, she does not take any meds. Seen this AM told her BP was 200/114 in allergy office- states she was rushing this AM  Review of Systems  - per above  CVS- no CP GEN- +fatigue EXT- no leg swelling     Objective:   Physical Exam GEN-NAD,alert and orientedx 3 CVS- RRR, no murmur RESP-CTAB EXT- no edema       Assessment & Plan:

## 2011-07-05 NOTE — Patient Instructions (Signed)
I recommend 30 minutes of exercise 5 days a week Watch the fats and carbs in your food Increase the blood pressure pill to 2 tablets daily. F/U 4 weeks

## 2011-07-10 HISTORY — PX: OTHER SURGICAL HISTORY: SHX169

## 2011-07-11 ENCOUNTER — Encounter: Payer: Self-pay | Admitting: Family Medicine

## 2011-08-02 ENCOUNTER — Ambulatory Visit (INDEPENDENT_AMBULATORY_CARE_PROVIDER_SITE_OTHER): Payer: BC Managed Care – PPO | Admitting: Family Medicine

## 2011-08-02 ENCOUNTER — Encounter: Payer: Self-pay | Admitting: Family Medicine

## 2011-08-02 VITALS — BP 152/98 | HR 62 | Resp 18 | Ht 61.0 in | Wt 173.0 lb

## 2011-08-02 DIAGNOSIS — G589 Mononeuropathy, unspecified: Secondary | ICD-10-CM

## 2011-08-02 DIAGNOSIS — I10 Essential (primary) hypertension: Secondary | ICD-10-CM

## 2011-08-02 DIAGNOSIS — E669 Obesity, unspecified: Secondary | ICD-10-CM

## 2011-08-02 DIAGNOSIS — G629 Polyneuropathy, unspecified: Secondary | ICD-10-CM | POA: Insufficient documentation

## 2011-08-02 MED ORDER — DILTIAZEM HCL ER COATED BEADS 120 MG PO TB24: 120.0000 mg | ORAL_TABLET | Freq: Every day | ORAL | Status: DC

## 2011-08-02 NOTE — Progress Notes (Signed)
  Subjective:    Patient ID: Jennifer Diaz, female    DOB: Sep 25, 1970, 41 y.o.   MRN: [id number]  HPI Patient here to followup high blood pressure. She's been taking her lisinopril HCTZ. She thinks it makes her feel bloated but has not gained any weight. She also has some mild swelling in her ankle region at times. She's also noticed over the past 2 months that she's had tingling and numbness and occasional burning sensation in her feet.  She is to see her GYN about her hot flashes and abnormal periods in May, currently has IUD- Dr. Algie Coffer in [place]  Labs reviewed from last visit   Review of Systems - per above    GEN- denies fatigue, fever, weight loss,weakness, recent illness HEENT- denies eye drainage, change in vision, nasal discharge, CVS- denies chest pain, palpitations RESP- denies SOB, cough, wheeze ABD- denies N/V, change in stools, abd pain GU- denies dysuria, hematuria, dribbling, incontinence MSK- denies joint pain, muscle aches, injury Neuro- denies headache, dizziness, syncope, seizure activity      Objective:   Physical Exam GEN- NAD, alert and oriented x3, no weight change CVS- RRR, no murmur RESP-CTAB EXT- No edema Pulses- Radial, DP- 2+ Monofilament- decreased Right foot- metarsal region, great toe, Left foot- beneath 5 th digit, no open lesions       Assessment & Plan:

## 2011-08-02 NOTE — Assessment & Plan Note (Signed)
Improved but still in control. I will start her on low-dose Cardizem once a day. She had a questionable reaction to amlodipine where she had a dark and rash on her back therefore this medication was stopped

## 2011-08-02 NOTE — Patient Instructions (Signed)
Start the new blood pressure medication Cardizem Please call if you have any concerns Try Vit B complex ( with B12 ) Try Multivitamin for women for the hot flashes Continue to exercise Drink plenty of fluids F/U 2 months for blood pressure

## 2011-08-02 NOTE — Assessment & Plan Note (Signed)
Pt describing symptoms of neuropathy, fasting CBG on labwork normal. At this time, it is not overwhelming, she wanted to try something natural therefore advised B12

## 2011-08-02 NOTE — Assessment & Plan Note (Signed)
Discussed importance of weight loss and change in diet with patient. She has been making some changes to her food choices

## 2011-10-03 ENCOUNTER — Ambulatory Visit (INDEPENDENT_AMBULATORY_CARE_PROVIDER_SITE_OTHER): Payer: BC Managed Care – PPO | Admitting: Family Medicine

## 2011-10-03 ENCOUNTER — Encounter: Payer: Self-pay | Admitting: Family Medicine

## 2011-10-03 VITALS — BP 170/100 | HR 66 | Resp 16 | Ht 61.0 in | Wt 176.0 lb

## 2011-10-03 DIAGNOSIS — E669 Obesity, unspecified: Secondary | ICD-10-CM

## 2011-10-03 DIAGNOSIS — I10 Essential (primary) hypertension: Secondary | ICD-10-CM

## 2011-10-03 DIAGNOSIS — R059 Cough, unspecified: Secondary | ICD-10-CM | POA: Insufficient documentation

## 2011-10-03 DIAGNOSIS — R05 Cough: Secondary | ICD-10-CM

## 2011-10-03 NOTE — Assessment & Plan Note (Signed)
Patient right now has a very hectic life and has not really motivated to work on exercise and diet at this time. Continue to reiterate importance.

## 2011-10-03 NOTE — Patient Instructions (Signed)
F/U next wed for blood pressure check Continue current meds

## 2011-10-03 NOTE — Assessment & Plan Note (Signed)
She tells me today that she's had a dry cough mostly at night since starting the blood pressure medication she does not feel ill at all. This is likely due to the lisinopril. She recently picked up the prescription and wants to complete this first. If her blood pressure is improved I will consider changing her to hydrochlorothiazide 25 mg and increasing the Cardizem to 240 mg

## 2011-10-03 NOTE — Progress Notes (Signed)
  Subjective:    Patient ID: Jennifer Diaz, female    DOB: 1970/11/09, 41 y.o.   MRN: [id number]  HPI  Pt here to f/u blood pressure, has noticed a dry cough since starting BP meds mostly at night, does not feel sick, has not been exercising, out of BP meds for 1 week because she has been too busy to go pick them up.   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  son broke his leg recently    Review of Systems   GEN- denies fatigue, fever, weight loss,weakness, recent illness HEENT- denies eye drainage, change in vision, nasal discharge, CVS- denies chest pain, palpitations RESP- denies SOB, +cough, wheeze ABD- denies N/V, change in stools, abd pain GU- denies dysuria, hematuria, dribbling, incontinence MSK- denies joint pain, muscle aches, injury Neuro- denies headache, dizziness, syncope, seizure activity      Objective:   Physical Exam GEN- NAD, alert and oriented x3 HEENT- PERRL, EOMI, non injected sclera, pink conjunctiva, MMM, oropharynx clear Neck- Supple,  CVS- RRR, no murmur RESP-CTAB EXT- No edema Pulses- Radial, DP- 2+        Assessment & Plan:

## 2011-10-03 NOTE — Assessment & Plan Note (Addendum)
Patient has been out of medication for over a week. She's not to restart her blood pressure medications. She will followup next Wednesday for recheck and any changes.

## 2011-10-11 ENCOUNTER — Encounter: Payer: Self-pay | Admitting: Family Medicine

## 2011-10-11 ENCOUNTER — Ambulatory Visit (INDEPENDENT_AMBULATORY_CARE_PROVIDER_SITE_OTHER): Payer: BC Managed Care – PPO | Admitting: Family Medicine

## 2011-10-11 VITALS — BP 142/86 | HR 71 | Resp 18 | Ht 61.0 in | Wt 174.0 lb

## 2011-10-11 DIAGNOSIS — I10 Essential (primary) hypertension: Secondary | ICD-10-CM

## 2011-10-11 NOTE — Assessment & Plan Note (Addendum)
She continues to have a dry hacking cough which I think is due to the lisinopril. She would like to continue her current medications and discuss change in her next visit. Her blood pressure is much improved today. She will continue her current medications. I did give her hand written scripts in case she runs out of medication while she is in [place]. She is due to be there for 4 weeks.  No jaundice seen on exam. She's given red flags if she does become jaundice or has abdominal pain. She's had seen back in March which was normal. She declined labs today and will have these repeated at the next visit

## 2011-10-11 NOTE — Progress Notes (Signed)
  Subjective:    Patient ID: Jennifer Diaz, female    DOB: July 30, 1970, 41 y.o.   MRN: [id number]  HPI Pt presents to f/u hypertension. She is restart her lisinopril HCTZ and her Cardizem. She did note no for the past couple days as she's felt a little lightheaded and fatigue since restarting the medicine. She also states that she thinks she has some yellowing in her eyes which comes and goes. No change in vision. She is leaving for [place] this weekend.   Review of Systems   GEN- + fatigue,denies  fever, weight loss,weakness, recent illness HEENT- denies eye drainage, change in vision, nasal discharge, CVS- denies chest pain, palpitations RESP- denies SOB, + dry cough, wheeze ABD- denies N/V, change in stools, abd pain Neuro- denies headache, dizziness, syncope, seizure activity, +lightheaded       Objective:   Physical Exam GEN- NAD, alert and oriented x3 HEENT- PERRL, EOMI, non injected sclera, pink conjunctiva, MMM, oropharynx clear, non icteric, fundoscopic exam benign CVS- RRR, no murmur RESP-CTAB ABD-NABS.soft,NO HSM EXT- No edema Pulses- Radial, DP- 2+ Neuro- CNII-XII in tact        Assessment & Plan:

## 2011-10-11 NOTE — Patient Instructions (Addendum)
Continue current meds  F/U 2 months -Sept , blood work at that visit

## 2011-11-17 ENCOUNTER — Ambulatory Visit (INDEPENDENT_AMBULATORY_CARE_PROVIDER_SITE_OTHER): Payer: BC Managed Care – PPO | Admitting: Family Medicine

## 2011-11-17 ENCOUNTER — Encounter: Payer: Self-pay | Admitting: Family Medicine

## 2011-11-17 VITALS — BP 160/92 | HR 87 | Temp 99.0°F | Resp 16 | Ht 61.0 in | Wt 180.1 lb

## 2011-11-17 DIAGNOSIS — J40 Bronchitis, not specified as acute or chronic: Secondary | ICD-10-CM

## 2011-11-17 DIAGNOSIS — J029 Acute pharyngitis, unspecified: Secondary | ICD-10-CM

## 2011-11-17 DIAGNOSIS — J020 Streptococcal pharyngitis: Secondary | ICD-10-CM

## 2011-11-17 DIAGNOSIS — I10 Essential (primary) hypertension: Secondary | ICD-10-CM

## 2011-11-17 LAB — POCT RAPID STREP A (OFFICE): Rapid Strep A Screen: POSITIVE — AB

## 2011-11-17 MED ORDER — ALBUTEROL SULFATE HFA 108 (90 BASE) MCG/ACT IN AERS: 2.0000 | INHALATION_SPRAY | RESPIRATORY_TRACT | Status: DC | PRN

## 2011-11-17 MED ORDER — AMOXICILLIN-POT CLAVULANATE 875-125 MG PO TABS: 1.0000 | ORAL_TABLET | Freq: Two times a day (BID) | ORAL | Status: AC

## 2011-11-17 MED ORDER — GUAIFENESIN-CODEINE 100-10 MG/5ML PO SYRP: 5.0000 mL | ORAL_SOLUTION | Freq: Four times a day (QID) | ORAL | Status: DC | PRN

## 2011-11-17 NOTE — Patient Instructions (Signed)
Take the antibiotics Albuterol as needed for shortness of breath or wheezing Cough medicine Call if you do not improve Keep appt in Sept

## 2011-11-19 ENCOUNTER — Encounter: Payer: Self-pay | Admitting: Family Medicine

## 2011-11-19 DIAGNOSIS — J020 Streptococcal pharyngitis: Secondary | ICD-10-CM | POA: Insufficient documentation

## 2011-11-19 DIAGNOSIS — J40 Bronchitis, not specified as acute or chronic: Secondary | ICD-10-CM | POA: Insufficient documentation

## 2011-11-19 NOTE — Assessment & Plan Note (Signed)
Strep positive, treat augmentin

## 2011-11-19 NOTE — Assessment & Plan Note (Signed)
Elevated BP today, no meds taken and [place], reassess in 4 weeks

## 2011-11-19 NOTE — Assessment & Plan Note (Signed)
Treat with antibiotics per above, cough syrup given, supportive care, fluids , inhaler for bedtime wheeze, cough

## 2011-11-19 NOTE — Progress Notes (Signed)
  Subjective:    Patient ID: Jennifer Diaz, female    DOB: 21-Oct-1970, 41 y.o.   MRN: [id number]  HPI Cough non productive sore throat and fever for past 10 days. She has returned from a month long stay in [place] when the family became sick in [place]. He has used OTC meds and advil only. Denies abdominal pain, diarrhea, rash. Both children have ear infections and URI. HTN- no  BP meds  Taken for 3 days with illness and nausea   Review of Systems - per above   GEN- + fatigue, +fever, weight loss,weakness, recent illness HEENT- denies eye drainage, change in vision, nasal discharge, CVS- denies chest pain, palpitations RESP- denies SOB, +cough, wheeze ABD- + N/ no V, change in stools, abd pain GU- denies dysuria, hematuria, dribbling, incontinence MSK- denies joint pain, muscle aches, injury Neuro- + headache, dizziness, syncope, seizure activity      Objective:   Physical Exam GEN- NAD, alert and oriented x3, sick appearing HEENT- PERRL, EOMI, non injected sclera, pink conjunctiva, MMM, oropharynx injected with exduates Neck- Supple, + LAD, TM clear bilat no effusion  CVS- RRR, no murmur RESP-clear with few scattered wheeze ABD-NABS,soft,NT,ND EXT- No edema Pulses- Radial, DP- 2+        Assessment & Plan:

## 2011-12-05 ENCOUNTER — Telehealth: Payer: Self-pay | Admitting: Family Medicine

## 2011-12-05 DIAGNOSIS — J069 Acute upper respiratory infection, unspecified: Secondary | ICD-10-CM

## 2011-12-05 MED ORDER — HYDROCODONE-ACETAMINOPHEN 5-500 MG PO TABS: 1.0000 | ORAL_TABLET | Freq: Four times a day (QID) | ORAL | Status: DC | PRN

## 2011-12-05 NOTE — Telephone Encounter (Signed)
Vicodin sent in for cough Please have her go get Chest xray, orders placed

## 2011-12-06 ENCOUNTER — Encounter (HOSPITAL_COMMUNITY): Payer: Self-pay

## 2011-12-06 ENCOUNTER — Ambulatory Visit (HOSPITAL_COMMUNITY)
Admission: RE | Admit: 2011-12-06 | Discharge: 2011-12-06 | Disposition: A | Discharge status: Home / Self Care | Payer: BC Managed Care – PPO | Source: Ambulatory Visit | Attending: Family Medicine | Admitting: Family Medicine

## 2011-12-06 DIAGNOSIS — R05 Cough: Secondary | ICD-10-CM | POA: Insufficient documentation

## 2011-12-06 DIAGNOSIS — J069 Acute upper respiratory infection, unspecified: Secondary | ICD-10-CM | POA: Insufficient documentation

## 2011-12-06 DIAGNOSIS — R059 Cough, unspecified: Secondary | ICD-10-CM | POA: Insufficient documentation

## 2011-12-06 NOTE — Telephone Encounter (Signed)
Pt aware.  Stating that she will probably not take the vicodin.  She is aware to go and have cxr done.

## 2011-12-06 NOTE — Telephone Encounter (Signed)
Noted  

## 2011-12-06 NOTE — Telephone Encounter (Signed)
pls make pt aware if not already done

## 2011-12-07 ENCOUNTER — Encounter: Payer: Self-pay | Admitting: Family Medicine

## 2011-12-07 ENCOUNTER — Ambulatory Visit (INDEPENDENT_AMBULATORY_CARE_PROVIDER_SITE_OTHER): Payer: BC Managed Care – PPO | Admitting: Family Medicine

## 2011-12-07 VITALS — BP 160/96 | HR 92 | Resp 18 | Ht 61.0 in | Wt 178.0 lb

## 2011-12-07 DIAGNOSIS — I10 Essential (primary) hypertension: Secondary | ICD-10-CM

## 2011-12-07 DIAGNOSIS — J40 Bronchitis, not specified as acute or chronic: Secondary | ICD-10-CM

## 2011-12-07 MED ORDER — HYDROCHLOROTHIAZIDE 25 MG PO TABS: 25.0000 mg | ORAL_TABLET | Freq: Every day | ORAL | Status: DC

## 2011-12-07 MED ORDER — PREDNISONE 10 MG PO TABS: ORAL_TABLET | ORAL | Status: DC

## 2011-12-07 MED ORDER — AZITHROMYCIN 250 MG PO TABS: ORAL_TABLET | ORAL | Status: AC

## 2011-12-07 NOTE — Progress Notes (Signed)
  Subjective:    Patient ID: Jennifer Diaz, female    DOB: 1970/06/25, 41 y.o.   MRN: [id number]  HPI   Patient presents with persistent cough productive of sputum, fatigue and low-grade fever. She was treated for upper respiratory infection strep pharyngitis after return trip from [place] approximately 4 weeks ago. X-ray was obtained yesterday which shows mild bronchitic changes but no infiltrate or pneumonia. Her family members illness have now resolved. She has been using cough drops and albuterol however this has not helped very much with her cough. She is also using humidifier. She did have a dry hacking cough from her blood pressure before she left on her trip but wanted to hold off on changing medication at that time.  Review of Systems  GEN- denies fatigue, fever, weight loss,weakness, recent illness HEENT- denies eye drainage, change in vision, nasal discharge, CVS- denies chest pain, palpitations RESP- denies SOB,+ cough, wheeze ABD- denies N/V, change in stools, abd pain GU- denies dysuria, hematuria, dribbling, incontinence MSK- denies joint pain, muscle aches, injury Neuro- denies headache, dizziness, syncope, seizure activity      Objective:   Physical Exam GEN- NAD, alert and oriented x3 HEENT- PERRL, EOMI, non injected sclera, pink conjunctiva, MMM, mild injection posterior oropharynx, no exudates, nares, clear, TM clear bilat  Neck- Supple, no LAD CVS- RRR, no murmur RESP-CTAB, harsh cough, no wheeze  EXT- No edema Pulses- Radial, DP- 2+        Assessment & Plan:

## 2011-12-07 NOTE — Patient Instructions (Addendum)
Stop the lisinopril Get the new antibiotic and prednisone Try the hydrocodone at bedtime  F/U 2 months

## 2011-12-08 NOTE — Assessment & Plan Note (Signed)
Symptoms continued to worsen. Chest x-ray did not show any infiltrate. I will give her a second course of antibiotics with azithromycin. She will be given low-dose prednisone and she can use the hydrocodone for cough and body ache.

## 2011-12-08 NOTE — Assessment & Plan Note (Signed)
Blood pressure uncontrolled. She's had some cough with the lisinopril we will stop this at this time. I will continue her diltiazem and increase her hydrochlorothiazide to 25 mg

## 2011-12-19 ENCOUNTER — Ambulatory Visit: Payer: BC Managed Care – PPO | Admitting: Family Medicine

## 2012-02-06 ENCOUNTER — Encounter: Payer: Self-pay | Admitting: Family Medicine

## 2012-02-06 ENCOUNTER — Ambulatory Visit (INDEPENDENT_AMBULATORY_CARE_PROVIDER_SITE_OTHER): Payer: BC Managed Care – PPO | Admitting: Family Medicine

## 2012-02-06 VITALS — BP 178/104 | HR 64 | Resp 16 | Ht 61.0 in | Wt 175.0 lb

## 2012-02-06 DIAGNOSIS — E669 Obesity, unspecified: Secondary | ICD-10-CM

## 2012-02-06 DIAGNOSIS — I10 Essential (primary) hypertension: Secondary | ICD-10-CM

## 2012-02-06 DIAGNOSIS — E785 Hyperlipidemia, unspecified: Secondary | ICD-10-CM

## 2012-02-06 DIAGNOSIS — Z1239 Encounter for other screening for malignant neoplasm of breast: Secondary | ICD-10-CM

## 2012-02-06 MED ORDER — DILTIAZEM HCL ER COATED BEADS 120 MG PO TB24: 120.0000 mg | ORAL_TABLET | Freq: Every day | ORAL | Status: DC

## 2012-02-06 NOTE — Progress Notes (Signed)
  Subjective:    Patient ID: Jennifer Diaz, female    DOB: 1971/01/14, 41 y.o.   MRN: [id number]  HPI Patient here to followup hypertension. She has no specific concerns. She's not been exercising as previous and she's been working very long hours h. 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 Her bronchitis has improved and she has rare coughing fits at this time. She's no longer using any medication. Of note today she only has her HCTZ she was confused and thought she was not supposed to take the Cardizem along with this   Review of Systems  GEN- denies fatigue, fever, weight loss,weakness, recent illness HEENT- denies eye drainage, change in vision, nasal discharge, CVS- denies chest pain, palpitations RESP- denies SOB, cough, wheeze ABD- denies N/V, change in stools, abd pain GU- denies dysuria, hematuria, dribbling, incontinence MSK- denies joint pain, muscle aches, injury Neuro- denies headache, dizziness, syncope, seizure activity      Objective:   Physical Exam GEN- NAD, alert and oriented x3 HEENT- PERRL, EOMI, non injected sclera, pink conjunctiva, MMM, oropharynx clear Neck- Supple,  CVS- RRR, no murmur RESP-CTAB EXT- No edema Pulses- Radial, DP- 2+        Assessment & Plan:

## 2012-02-06 NOTE — Assessment & Plan Note (Signed)
Recheck FLP, if worsened she may need statin therapy

## 2012-02-06 NOTE — Assessment & Plan Note (Addendum)
Weight down 3lbs, she asked about a supplement called Almased, advised I will check into this first before she starts

## 2012-02-06 NOTE — Assessment & Plan Note (Signed)
Restart Cardizem with HCTZ, discussed importance of exercise and diet

## 2012-02-06 NOTE — Patient Instructions (Addendum)
Restart cardizem Continue HCTZ  Get cholesterol checked fasting  Mammogram - call to schedule  F/U 6 weeks

## 2012-02-12 ENCOUNTER — Ambulatory Visit (HOSPITAL_COMMUNITY): Payer: BC Managed Care – PPO

## 2012-02-13 ENCOUNTER — Ambulatory Visit (HOSPITAL_COMMUNITY)
Admission: RE | Admit: 2012-02-13 | Discharge: 2012-02-13 | Disposition: A | Discharge status: Home / Self Care | Payer: BC Managed Care – PPO | Source: Ambulatory Visit | Attending: Family Medicine | Admitting: Family Medicine

## 2012-02-13 DIAGNOSIS — Z1231 Encounter for screening mammogram for malignant neoplasm of breast: Secondary | ICD-10-CM | POA: Insufficient documentation

## 2012-02-13 DIAGNOSIS — Z1239 Encounter for other screening for malignant neoplasm of breast: Secondary | ICD-10-CM

## 2012-03-05 ENCOUNTER — Encounter: Payer: Self-pay | Admitting: Family Medicine

## 2012-03-21 ENCOUNTER — Ambulatory Visit (INDEPENDENT_AMBULATORY_CARE_PROVIDER_SITE_OTHER): Payer: BC Managed Care – PPO | Admitting: Family Medicine

## 2012-03-21 ENCOUNTER — Encounter: Payer: Self-pay | Admitting: Family Medicine

## 2012-03-21 VITALS — BP 170/108 | HR 70 | Resp 16 | Wt 180.0 lb

## 2012-03-21 DIAGNOSIS — I10 Essential (primary) hypertension: Secondary | ICD-10-CM

## 2012-03-21 DIAGNOSIS — E669 Obesity, unspecified: Secondary | ICD-10-CM

## 2012-03-21 MED ORDER — DILTIAZEM HCL ER COATED BEADS 240 MG PO TB24: 240.0000 mg | ORAL_TABLET | Freq: Every day | ORAL | Status: DC

## 2012-03-21 NOTE — Patient Instructions (Addendum)
Continue HCTZ Cardizem changed to 240mg  once a day Get labs done fasting F/U 2 weeks for blood pressure

## 2012-03-21 NOTE — Assessment & Plan Note (Signed)
deteriaoted discussed risks of obesity with increased weight, discussed diet and exercise

## 2012-03-21 NOTE — Assessment & Plan Note (Addendum)
Uncontrolled increase cardizem to 240mg  Continue HCTZ She does give some symptoms of sleep apnea? Related to difficult to control BP  Consider next step- changing to maxzide or adding clonidine

## 2012-03-21 NOTE — Progress Notes (Signed)
  Subjective:    Patient ID: Jennifer Diaz, female    DOB: [birth year], 41 y.o.   MRN: [id number]  HPI  Patient here to followup chronic medical problems. She's taking the diltiazem 120 mg and HCTZ yet her blood pressure is still quite elevated. She recently returned from a trip in [place]. She denies any headache, dizziness, chest pain, shortness of breath. She is also gaining 5 pounds however she's not exercising and has been over eating.  She occasionally gets pain in pelvis related to her Assure device but does not want removed  Review of Systems  GEN- denies fatigue, fever, weight loss,weakness, recent illness HEENT- denies eye drainage, change in vision, nasal discharge, CVS- denies chest pain, palpitations RESP- denies SOB, cough, wheeze ABD- denies N/V, change in stools, abd pain GU- denies dysuria, hematuria, dribbling, incontinence MSK- denies joint pain, muscle aches, injury Neuro- denies headache, dizziness, syncope, seizure activity      Objective:   Physical Exam GEN- NAD, alert and oriented x3, repeat 166/100 HEENT- PERRL, EOMI, non injected sclera, pink conjunctiva, MMM, oropharynx clear CVS- RRR, no murmur RESP-CTAB EXT- No edema Pulses- Radial, DP- 2+        Assessment & Plan:

## 2012-04-04 ENCOUNTER — Ambulatory Visit: Payer: BC Managed Care – PPO | Admitting: Family Medicine

## 2012-04-09 ENCOUNTER — Other Ambulatory Visit (HOSPITAL_COMMUNITY): Payer: Self-pay | Admitting: Obstetrics

## 2012-04-09 DIAGNOSIS — N971 Female infertility of tubal origin: Secondary | ICD-10-CM

## 2012-04-11 ENCOUNTER — Ambulatory Visit: Payer: BC Managed Care – PPO | Admitting: Family Medicine

## 2012-04-11 ENCOUNTER — Encounter: Payer: Self-pay | Admitting: Family Medicine

## 2012-04-12 ENCOUNTER — Ambulatory Visit (HOSPITAL_COMMUNITY)
Admission: RE | Admit: 2012-04-12 | Discharge: 2012-04-12 | Disposition: A | Discharge status: Home / Self Care | Payer: BC Managed Care – PPO | Source: Ambulatory Visit | Attending: Obstetrics | Admitting: Obstetrics

## 2012-04-12 DIAGNOSIS — N971 Female infertility of tubal origin: Secondary | ICD-10-CM

## 2012-04-12 DIAGNOSIS — Z3049 Encounter for surveillance of other contraceptives: Secondary | ICD-10-CM | POA: Insufficient documentation

## 2012-04-12 MED ORDER — IOHEXOL 300 MG/ML  SOLN: 5.0000 mL | Freq: Once | INTRAMUSCULAR | Status: AC | PRN

## 2013-02-10 ENCOUNTER — Other Ambulatory Visit (HOSPITAL_COMMUNITY): Payer: Self-pay | Admitting: Obstetrics

## 2013-02-10 DIAGNOSIS — Z139 Encounter for screening, unspecified: Secondary | ICD-10-CM

## 2013-02-17 ENCOUNTER — Ambulatory Visit (HOSPITAL_COMMUNITY)
Admission: RE | Admit: 2013-02-17 | Discharge: 2013-02-17 | Disposition: A | Discharge status: Home / Self Care | Payer: BC Managed Care – PPO | Source: Ambulatory Visit | Attending: Obstetrics | Admitting: Obstetrics

## 2013-02-17 DIAGNOSIS — Z139 Encounter for screening, unspecified: Secondary | ICD-10-CM

## 2013-02-17 DIAGNOSIS — Z1231 Encounter for screening mammogram for malignant neoplasm of breast: Secondary | ICD-10-CM | POA: Insufficient documentation

## 2013-08-11 ENCOUNTER — Emergency Department (HOSPITAL_COMMUNITY): Payer: BC Managed Care – PPO

## 2013-08-11 ENCOUNTER — Emergency Department (HOSPITAL_COMMUNITY)
Admission: EM | Admit: 2013-08-11 | Discharge: 2013-08-11 | Disposition: A | Discharge status: Home / Self Care | Payer: BC Managed Care – PPO | Attending: Emergency Medicine | Admitting: Emergency Medicine

## 2013-08-11 ENCOUNTER — Encounter (HOSPITAL_COMMUNITY): Payer: Self-pay | Admitting: Emergency Medicine

## 2013-08-11 DIAGNOSIS — R51 Headache: Secondary | ICD-10-CM | POA: Insufficient documentation

## 2013-08-11 DIAGNOSIS — I10 Essential (primary) hypertension: Secondary | ICD-10-CM

## 2013-08-11 DIAGNOSIS — Z862 Personal history of diseases of the blood and blood-forming organs and certain disorders involving the immune mechanism: Secondary | ICD-10-CM | POA: Insufficient documentation

## 2013-08-11 DIAGNOSIS — Z79899 Other long term (current) drug therapy: Secondary | ICD-10-CM | POA: Insufficient documentation

## 2013-08-11 DIAGNOSIS — R519 Headache, unspecified: Secondary | ICD-10-CM

## 2013-08-11 DIAGNOSIS — R110 Nausea: Secondary | ICD-10-CM | POA: Insufficient documentation

## 2013-08-11 DIAGNOSIS — Z8639 Personal history of other endocrine, nutritional and metabolic disease: Secondary | ICD-10-CM | POA: Insufficient documentation

## 2013-08-11 LAB — BASIC METABOLIC PANEL
BUN: 10 mg/dL (ref 6–23)
CALCIUM: 9.6 mg/dL (ref 8.4–10.5)
CO2: 22 mEq/L (ref 19–32)
CREATININE: 0.6 mg/dL (ref 0.50–1.10)
Chloride: 101 mEq/L (ref 96–112)
GFR calc Af Amer: >90 mL/min (ref >90)
GLUCOSE: 94 mg/dL (ref 70–99)
Potassium: 3.7 mEq/L (ref 3.7–5.3)
Sodium: 138 mEq/L (ref 137–147)

## 2013-08-11 MED ORDER — LABETALOL HCL 5 MG/ML IV SOLN
20.0000 mg | INTRAVENOUS | Status: DC | PRN
  Administered 2013-08-11 (×2): 20 mg via INTRAVENOUS
  Filled 2013-08-11 (×2): qty 4

## 2013-08-11 MED ORDER — METOCLOPRAMIDE HCL 5 MG/ML IJ SOLN
10.0000 mg | Freq: Once | INTRAMUSCULAR | Status: AC
  Administered 2013-08-11: 10 mg via INTRAVENOUS
  Filled 2013-08-11: qty 2

## 2013-08-11 MED ORDER — MORPHINE SULFATE 4 MG/ML IJ SOLN
4.0000 mg | INTRAMUSCULAR | Status: DC | PRN
  Administered 2013-08-11: 4 mg via INTRAVENOUS
  Filled 2013-08-11: qty 1

## 2013-08-11 MED ORDER — HYDROCHLOROTHIAZIDE 25 MG PO TABS: 25.0000 mg | ORAL_TABLET | Freq: Every day | ORAL | Status: DC

## 2013-08-11 MED ORDER — HYDROCODONE-ACETAMINOPHEN 5-325 MG PO TABS: 2.0000 | ORAL_TABLET | ORAL | Status: DC | PRN

## 2013-08-11 NOTE — ED Provider Notes (Signed)
CSN: [id number]     Arrival date & time 08/11/13  1556 History   First MD Initiated Contact with Patient 08/11/13 1619     Chief Complaint  Patient presents with  . Headache      HPI  Presents chief complaint headache. His headache posterior headache for the last 10 days. States is present every day. He was slow in onset. Not a thunderclap event. Mild nausea. She has a history of migraine headaches. Typically these are frontal. Has history of hypertension. She has been off her medication for over one month.  Chart states she was typically on  hydrochlorothiazide. She is adamant that she was only on hydrochlorothiazide. No vision loss. No chest pain. No shortness of breath. No edema. No numbness weakness or tingling. No hemiparesis.  Past Medical History  Diagnosis Date  . Allergy   . Hypertension   . Hyperlipidemia   . Migraine   . Sleep apnea    Past Surgical History  Procedure Laterality Date  . Essure  April 2013    GYN- Dr. Algie CofferFogelman   Family History  Problem Relation Age of Onset  . Diabetes Mother   . Hypertension Father   . Diabetes Brother    History  Substance Use Topics  . Smoking status: Never Smoker   . Smokeless tobacco: Not on file  . Alcohol Use: No   OB History   Grav Para Term Preterm Abortions TAB SAB Ect Mult Living                 Review of Systems  Constitutional: Negative for fever, chills, diaphoresis, appetite change and fatigue.  HENT: Negative for mouth sores, sore throat and trouble swallowing.   Eyes: Negative for visual disturbance.  Respiratory: Negative for cough, chest tightness, shortness of breath and wheezing.   Cardiovascular: Negative for chest pain.  Gastrointestinal: Positive for nausea. Negative for vomiting, abdominal pain, diarrhea and abdominal distention.  Endocrine: Negative for polydipsia, polyphagia and polyuria.  Genitourinary: Negative for dysuria, frequency and hematuria.  Musculoskeletal: Negative for gait problem.   Skin: Negative for color change, pallor and rash.  Neurological: Positive for headaches. Negative for dizziness, syncope and light-headedness.  Hematological: Does not bruise/bleed easily.  Psychiatric/Behavioral: Negative for behavioral problems and confusion.      Allergies  Lisinopril and Norvasc  Home Medications   Prior to Admission medications   Medication Sig Start Date End Date Taking? Authorizing Provider  albuterol (PROVENTIL HFA;VENTOLIN HFA) 108 (90 BASE) MCG/ACT inhaler Inhale 2 puffs into the lungs every 4 (four) hours as needed for wheezing. 11/17/11 11/16/12  Salley ScarletKawanta F [place], MD  diltiazem (CARDIZEM LA) 240 MG 24 hr tablet Take 1 tablet (240 mg total) by mouth daily. 03/21/12   Salley ScarletKawanta F [place], MD  hydrochlorothiazide (HYDRODIURIL) 25 MG tablet Take 1 tablet (25 mg total) by mouth daily. 12/07/11 12/06/12  Salley ScarletKawanta F , MD   BP 162/99  Pulse 64  Temp(Src) 97.2 F (36.2 C) (Oral)  Resp 16  Ht 5\' 1"  (1.549 m)  Wt 165 lb (74.844 kg)  BMI 31.19 kg/m2  SpO2 99%  LMP 08/04/2013 Physical Exam  Constitutional: She is oriented to person, place, and time. She appears well-developed and well-nourished. No distress.  HENT:  Head: Normocephalic.  Eyes: Conjunctivae are normal. Pupils are equal, round, and reactive to light. No scleral icterus.  Neck: Normal range of motion. Neck supple. No thyromegaly present.  Cardiovascular: Normal rate and regular rhythm.  Exam reveals no gallop and  no friction rub.   No murmur heard. Pulmonary/Chest: Effort normal and breath sounds normal. No respiratory distress. She has no wheezes. She has no rales.  Abdominal: Soft. Bowel sounds are normal. She exhibits no distension. There is no tenderness. There is no rebound.  Musculoskeletal: Normal range of motion.  Neurological: She is alert and oriented to person, place, and time.  No cranial nerve abnormalities. No visual field deficits. No numbness or loss of strength to extremities.  Normal symmetric neurological exam. Supple neck.  Skin: Skin is warm and dry. No rash noted.  Psychiatric: She has a normal mood and affect. Her behavior is normal.    ED Course  Procedures (including critical care time) Labs Review Labs Reviewed  BASIC METABOLIC PANEL    Imaging Review Ct Head Wo Contrast  08/11/2013   CLINICAL DATA:  Headache, nausea, hypertension  EXAM: CT HEAD WITHOUT CONTRAST  TECHNIQUE: Contiguous axial images were obtained from the base of the skull through the vertex without intravenous contrast.  COMPARISON:  None.  FINDINGS: No evidence of parenchymal hemorrhage or extra-axial fluid collection. No mass lesion, mass effect, or midline shift.  No CT evidence of acute infarction.  Subcortical white matter and periventricular small vessel ischemic changes, particularly in the frontal lobes bilaterally.  Cerebral volume is within normal limits.  No ventriculomegaly.  The visualized paranasal sinuses are essentially clear. The mastoid air cells are unopacified.  No evidence of calvarial fracture.  IMPRESSION: No evidence of acute intracranial abnormality.  Mild small vessel ischemic changes.   Electronically Signed   By: Charline BillsSriyesh  Krishnan M.D.   On: 08/11/2013 18:03     EKG Interpretation None      MDM   Final diagnoses:  Hypertension  Headache    No neurological abnormalities. Plan we've blood pressure and pain control. CT scan. Last to evaluate kidney function.  \ CT shows no bleed or acute abnormalities. Renal function intact. BP rechecked at 160/91. Headache down to a 2/10. Plan is discharge home. Resume her thiazide.. Vicodin, PRN    Rolland PorterMark Quavon Keisling, MD 08/11/13 [id number]

## 2013-08-11 NOTE — ED Notes (Addendum)
Headache, for 10 days, nausea, no vomiting. No injury,  Chills at times, no fever  No bp med for 3 mos

## 2013-08-11 NOTE — Discharge Instructions (Signed)
Hypertension As your heart beats, it forces blood through your arteries. This force is your blood pressure. If the pressure is too high, it is called hypertension (HTN) or high blood pressure. HTN is dangerous because you may have it and not know it. High blood pressure may mean that your heart has to work harder to pump blood. Your arteries may be narrow or stiff. The extra work puts you at risk for heart disease, stroke, and other problems.  Blood pressure consists of two numbers, a higher number over a lower, 110/72, for example. It is stated as "110 over 72." The ideal is below 120 for the top number (systolic) and under 80 for the bottom (diastolic). Write down your blood pressure today. You should pay close attention to your blood pressure if you have certain conditions such as:  Heart failure.  Prior heart attack.  Diabetes  Chronic kidney disease.  Prior stroke.  Multiple risk factors for heart disease. To see if you have HTN, your blood pressure should be measured while you are seated with your arm held at the level of the heart. It should be measured at least twice. A one-time elevated blood pressure reading (especially in the Emergency Department) does not mean that you need treatment. There may be conditions in which the blood pressure is different between your right and left arms. It is important to see your caregiver soon for a recheck. Most people have essential hypertension which means that there is not a specific cause. This type of high blood pressure may be lowered by changing lifestyle factors such as:  Stress.  Smoking.  Lack of exercise.  Excessive weight.  Drug/tobacco/alcohol use.  Eating less salt. Most people do not have symptoms from high blood pressure until it has caused damage to the body. Effective treatment can often prevent, delay or reduce that damage. TREATMENT  When a cause has been identified, treatment for high blood pressure is directed at the  cause. There are a large number of medications to treat HTN. These fall into several categories, and your caregiver will help you select the medicines that are best for you. Medications may have side effects. You should review side effects with your caregiver. If your blood pressure stays high after you have made lifestyle changes or started on medicines,   Your medication(s) may need to be changed.  Other problems may need to be addressed.  Be certain you understand your prescriptions, and know how and when to take your medicine.  Be sure to follow up with your caregiver within the time frame advised (usually within two weeks) to have your blood pressure rechecked and to review your medications.  If you are taking more than one medicine to lower your blood pressure, make sure you know how and at what times they should be taken. Taking two medicines at the same time can result in blood pressure that is too low. SEEK IMMEDIATE MEDICAL CARE IF:  You develop a severe headache, blurred or changing vision, or confusion.  You have unusual weakness or numbness, or a faint feeling.  You have severe chest or abdominal pain, vomiting, or breathing problems. MAKE SURE YOU:   Understand these instructions.  Will watch your condition.  Will get help right away if you are not doing well or get worse. Document Released: 03/27/2005 Document Revised: 06/19/2011 Document Reviewed: 11/15/2007 [facility] Patient Information 2014 [place], [place].  Recurrent Migraine Headache A migraine headache is an intense, throbbing pain on one or both  sides of your head. Recurrent migraines keep coming back. A migraine can last for 30 minutes to several hours. CAUSES  The exact cause of a migraine headache is not always known. However, a migraine may be caused when nerves in the brain become irritated and release chemicals that cause inflammation. This causes pain. Certain things may also trigger migraines, such as:     Alcohol.  Smoking.  Stress.  Menstruation.  Aged cheeses.  Foods or drinks that contain nitrates, glutamate, aspartame, or tyramine.  Lack of sleep.  Chocolate.  Caffeine.  Hunger.  Physical exertion.  Fatigue.  Medicines used to treat chest pain (nitroglycerine), birth control pills, estrogen, and some blood pressure medicines. SYMPTOMS   Pain on one or both sides of your head.  Pulsating or throbbing pain.  Severe pain that prevents daily activities.  Pain that is aggravated by any physical activity.  Nausea, vomiting, or both.  Dizziness.  Pain with exposure to bright lights, loud noises, or activity.  General sensitivity to bright lights, loud noises, or smells. Before you get a migraine, you may get warning signs that a migraine is coming (aura). An aura may include:  Seeing flashing lights.  Seeing bright spots, halos, or zig-zag lines.  Having tunnel vision or blurred vision.  Having feelings of numbness or tingling.  Having trouble talking.  Having muscle weakness. DIAGNOSIS  A recurrent migraine headache is often diagnosed based on:  Symptoms.  Physical examination.  A CT scan or MRI of your head. These imaging tests cannot diagnose migraines, but can help rule out other causes of headaches.  TREATMENT  Medicines may be given for pain and nausea. Medicines can also be given to help prevent recurrent migraines. HOME CARE INSTRUCTIONS  Only take over-the-counter or prescription medicines for pain or discomfort as directed by your health care provider. The use of long-term narcotics is not recommended.  Lie down in a dark, quiet room when you have a migraine.  Keep a journal to find out what may trigger your migraine headaches. For example, write down:  What you eat and drink.  How much sleep you get.  Any change to your diet or medicines.  Limit alcohol consumption.  Quit smoking if you smoke.  Get 7 9 hours of sleep, or  as recommended by your health care provider.  Limit stress.  Keep lights dim if bright lights bother you and make your migraines worse. SEEK MEDICAL CARE IF:   You do not get relief from the medicines given to you.  You have a recurrence of pain. SEEK IMMEDIATE MEDICAL CARE IF:  Your migraine becomes severe.  You have a fever.  You have a stiff neck.  You have loss of vision.  You have muscular weakness or loss of muscle control.  You start losing your balance or have trouble walking.  You feel faint or pass out.  You have severe symptoms that are different from your first symptoms. MAKE SURE YOU:   Understand these instructions.  Will watch your condition.  Will get help right away if you are not doing well or get worse. Document Released: 12/20/2000 Document Revised: 01/15/2013 Document Reviewed: 12/02/2012 [facility] Patient Information 2014 [place]Care, [place].

## 2013-08-15 ENCOUNTER — Ambulatory Visit (INDEPENDENT_AMBULATORY_CARE_PROVIDER_SITE_OTHER): Payer: BC Managed Care – PPO | Admitting: Family Medicine

## 2013-08-15 ENCOUNTER — Encounter: Payer: Self-pay | Admitting: Family Medicine

## 2013-08-15 VITALS — BP 180/100 | HR 78 | Temp 98.0°F | Resp 16 | Ht 60.0 in | Wt 168.0 lb

## 2013-08-15 DIAGNOSIS — R51 Headache: Secondary | ICD-10-CM

## 2013-08-15 DIAGNOSIS — I10 Essential (primary) hypertension: Secondary | ICD-10-CM

## 2013-08-15 DIAGNOSIS — B029 Zoster without complications: Secondary | ICD-10-CM | POA: Insufficient documentation

## 2013-08-15 MED ORDER — METOPROLOL SUCCINATE ER 50 MG PO TB24: 50.0000 mg | ORAL_TABLET | Freq: Every day | ORAL | Status: DC

## 2013-08-15 MED ORDER — VALACYCLOVIR HCL 1 G PO TABS: 1000.0000 mg | ORAL_TABLET | Freq: Three times a day (TID) | ORAL | Status: DC

## 2013-08-15 MED ORDER — HYDROCHLOROTHIAZIDE 25 MG PO TABS: 25.0000 mg | ORAL_TABLET | Freq: Every day | ORAL | Status: DC

## 2013-08-15 NOTE — Assessment & Plan Note (Signed)
Her blood pressure has been uncontrolled. We discussed the importance of followup on irregular basis. She did have a metabolic panel in emergency room which was normal. She does need to return for fasting labs. With her headaches I will go ahead and add on Toprol 50 mg once a day she will continue the hydrochlorothiazide 25 mg

## 2013-08-15 NOTE — Progress Notes (Signed)
Patient ID: Jennifer GriffithAnjli A Mitchell, female   DOB: 07/27/1970, 43 y.o.   MRN: [id number]   Subjective:    Patient ID: Jennifer GriffithAnjli A Leamer, female    DOB: 07/27/1970, 43 y.o.   MRN: [id number]  Patient presents for CPE, BP and Rash  patient here to followup blood pressure as well as rash. She was last seen by me in December 2013. She states she did not know that moved to a different location. She had been treated for uncontrolled hypertension she was last on Cardizem 240 mg and hydrochlorothiazide. She was lost to followup from there. For the past couple weeks she's been having increased headaches and her blood pressure has been very elevated. She was seen in emergency room one may fifth at that time she was given abortive treatment for migraine headache as well as started on hydrochlorothiazide. She is here today her headache has improved but she continues to have some tension. She states there is a lot of stress as her husband was recently ill and her oldest son is going off to college. 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  She's concerned about a rash pop up over the past couple of days. It is very tingly and burns in the skin. She noticed part of the rash on her right flank area then noticed a couple spots near the middle for spine     Review Of Systems:  GEN- denies fatigue, fever, weight loss,weakness, recent illness HEENT- denies eye drainage, change in vision, nasal discharge, CVS- denies chest pain, palpitations RESP- denies SOB, cough, wheeze ABD- denies N/V, change in stools, abd pain GU- denies dysuria, hematuria, dribbling, incontinence MSK- denies joint pain, muscle aches, injury Neuro- + headache, dizziness, syncope, seizure activity       Objective:    BP 138/72  Pulse 78  Temp(Src) 98 F (36.7 C) (Oral)  Resp 16  Ht 5' (1.524 m)  Wt 168 lb (76.204 kg)  BMI 32.81 kg/m2  LMP 08/04/2013 GEN- NAD, alert and oriented x3, weight down 12lbs HEENT- PERRL,  EOMI, non injected sclera, pink conjunctiva, MMM, oropharynx clear, fundus benign Neck- Supple, no thyromegaly CVS- RRR, no murmur RESP-CTAB Skin- cluster of erythematous maculopapular lesions on right flank, few scattered lesions near right of lumbar spine, mild TTP, no discrete blisters EXT- No edema Pulses- Radial 2+        Assessment & Plan:      Problem List Items Addressed This Visit   None      Note: This dictation was prepared with Dragon dictation along with smaller phrase technology. Any transcriptional errors that result from this process are unintentional.

## 2013-08-15 NOTE — Assessment & Plan Note (Signed)
Her rash is concerning for shingles based on the appearance. Will start her on Valtrex 3 times a day for one week. She will call if anything changes

## 2013-08-15 NOTE — Assessment & Plan Note (Signed)
I think this is multifactorial migraine headaches due to stress as well as the elevated blood pressure. We'll see how she improves I am starting a beta blocker however I think that this will bring her blood pressure down better than the propanolol. Her headache still does not improve we'll consider migraine disorder and Topamax

## 2013-08-15 NOTE — Patient Instructions (Signed)
Continue HCTZ Start Toprol  Valtrex for possible shingles  F/U 2 weeks- fasting

## 2013-08-17 ENCOUNTER — Encounter: Payer: Self-pay | Admitting: Family Medicine

## 2013-08-29 ENCOUNTER — Ambulatory Visit (INDEPENDENT_AMBULATORY_CARE_PROVIDER_SITE_OTHER): Payer: BC Managed Care – PPO | Admitting: Family Medicine

## 2013-08-29 ENCOUNTER — Encounter: Payer: Self-pay | Admitting: Family Medicine

## 2013-08-29 VITALS — BP 140/90 | HR 76 | Temp 97.7°F | Resp 14 | Ht 60.0 in | Wt 170.0 lb

## 2013-08-29 DIAGNOSIS — Z1329 Encounter for screening for other suspected endocrine disorder: Secondary | ICD-10-CM

## 2013-08-29 DIAGNOSIS — Z1321 Encounter for screening for nutritional disorder: Secondary | ICD-10-CM

## 2013-08-29 DIAGNOSIS — E785 Hyperlipidemia, unspecified: Secondary | ICD-10-CM

## 2013-08-29 DIAGNOSIS — I10 Essential (primary) hypertension: Secondary | ICD-10-CM

## 2013-08-29 DIAGNOSIS — S61409A Unspecified open wound of unspecified hand, initial encounter: Secondary | ICD-10-CM

## 2013-08-29 DIAGNOSIS — Z13228 Encounter for screening for other metabolic disorders: Secondary | ICD-10-CM

## 2013-08-29 DIAGNOSIS — S61419A Laceration without foreign body of unspecified hand, initial encounter: Secondary | ICD-10-CM | POA: Insufficient documentation

## 2013-08-29 DIAGNOSIS — Z130 Encounter for screening for diseases of the blood and blood-forming organs and certain disorders involving the immune mechanism: Secondary | ICD-10-CM

## 2013-08-29 LAB — COMPREHENSIVE METABOLIC PANEL
ALBUMIN: 4.4 g/dL (ref 3.5–5.2)
ALT: 32 U/L (ref 0–35)
AST: 25 U/L (ref 0–37)
Alkaline Phosphatase: 105 U/L (ref 39–117)
BUN: 9 mg/dL (ref 6–23)
CALCIUM: 9.5 mg/dL (ref 8.4–10.5)
CHLORIDE: 99 meq/L (ref 96–112)
CO2: 28 meq/L (ref 19–32)
CREATININE: 0.63 mg/dL (ref 0.50–1.10)
GLUCOSE: 88 mg/dL (ref 70–99)
POTASSIUM: 4 meq/L (ref 3.5–5.3)
Sodium: 138 mEq/L (ref 135–145)
Total Bilirubin: 0.7 mg/dL (ref 0.2–1.2)
Total Protein: 7 g/dL (ref 6.0–8.3)

## 2013-08-29 LAB — LIPID PANEL
Cholesterol: 220 mg/dL — ABNORMAL HIGH (ref 0–200)
HDL: 57 mg/dL (ref >39)
LDL Cholesterol: 140 mg/dL — ABNORMAL HIGH (ref 0–99)
TRIGLYCERIDES: 117 mg/dL (ref <150)
Total CHOL/HDL Ratio: 3.9 Ratio
VLDL: 23 mg/dL (ref 0–40)

## 2013-08-29 LAB — CBC WITH DIFFERENTIAL/PLATELET
BASOS ABS: 0.1 10*3/uL (ref 0.0–0.1)
Basophils Relative: 1 % (ref 0–1)
EOS PCT: 3 % (ref 0–5)
Eosinophils Absolute: 0.3 10*3/uL (ref 0.0–0.7)
HCT: 42.2 % (ref 36.0–46.0)
HEMOGLOBIN: 14.5 g/dL (ref 12.0–15.0)
LYMPHS ABS: 2.8 10*3/uL (ref 0.7–4.0)
Lymphocytes Relative: 29 % (ref 12–46)
MCH: 27.5 pg (ref 26.0–34.0)
MCHC: 34.4 g/dL (ref 30.0–36.0)
MCV: 80.1 fL (ref 78.0–100.0)
MONO ABS: 0.7 10*3/uL (ref 0.1–1.0)
Monocytes Relative: 7 % (ref 3–12)
NEUTROS ABS: 5.9 10*3/uL (ref 1.7–7.7)
Neutrophils Relative %: 60 % (ref 43–77)
Platelets: 317 10*3/uL (ref 150–400)
RBC: 5.27 MIL/uL — ABNORMAL HIGH (ref 3.87–5.11)
RDW: 15.1 % (ref 11.5–15.5)
WBC: 9.8 10*3/uL (ref 4.0–10.5)

## 2013-08-29 NOTE — Assessment & Plan Note (Signed)
Blood pressure much improved continue current medications fasting lab

## 2013-08-29 NOTE — Assessment & Plan Note (Signed)
Tetanus booster is up to date. I do not think this requires suturing today she is a nice clean linear cuts through the epidermis. Clean at the bedside and Steri-Strips applied with some tension.

## 2013-08-29 NOTE — Patient Instructions (Signed)
Keep the steri strips and neosporin Continue current blood pressure medications Use cortisone on your back as needed  F/U 4 months

## 2013-08-29 NOTE — Progress Notes (Signed)
Patient ID: Jennifer Diaz, female   DOB: [birth year], 43 y.o.   MRN: [id number]   Subjective:    Patient ID: Jennifer Diaz, female    DOB: Feb 27, 1971, 43 y.o.   MRN: [id number]  Patient presents for 2 week F/U and laceration to L palm  patient here to followup blood pressure. She was seen last week and restart her on her medications. She's not had any difficulties with medications. She's also due for fasting labs.  Laceration to her left hand she was baking a cake which he with a slice through she went quite hard and a slice through her rubber gloves with a linear laceration. She's not when he goes to the emergency room as it was not very deep therefore she went to the pharmacy and then apply Neosporin and they also have her get Steri-Strips. She has a mild tenderness in the hand but no significant bleeding.    Review Of Systems:  GEN- denies fatigue, fever, weight loss,weakness, recent illness HEENT- denies eye drainage, change in vision, nasal discharge, CVS- denies chest pain, palpitations RESP- denies SOB, cough, wheeze ABD- denies N/V, change in stools, abd pain GU- denies dysuria, hematuria, dribbling, incontinence MSK- denies joint pain, muscle aches,+ injury Neuro- denies headache, dizziness, syncope, seizure activity       Objective:    BP 140/90  Pulse 76  Temp(Src) 97.7 F (36.5 C) (Oral)  Resp 14  Ht 5' (1.524 m)  Wt 170 lb (77.111 kg)  BMI 33.20 kg/m2  LMP 08/04/2013 GEN- NAD, alert and oriented x3 HEENT- PERRL, EOMI, non injected sclera, pink conjunctiva, MMM, oropharynx clear Neck- Supple, no thyromegaly CVS- RRR, no murmur RESP-CTAB Skin - Left palm 1.5cm clean liner  laceration through dermis, minimal bleeding, mild TTP, no erythema no discharge,  EXT- No edema Pulses- Radial, DP- 2+        Assessment & Plan:      Problem List Items Addressed This Visit   Laceration of hand - Primary     Tetanus booster is up to date. I do not think this  requires suturing today she is a nice clean linear cuts through the epidermis. Clean at the bedside and Steri-Strips applied with some tension.    HYPERTENSION     Blood pressure much improved continue current medications fasting lab    Relevant Orders      CBC with Differential (Completed)   HYPERLIPIDEMIA   Relevant Orders      Comprehensive metabolic panel (Completed)      Lipid panel (Completed)    Other Visit Diagnoses   Encounter for vitamin deficiency screening        Relevant Orders       Vitamin D, 25-hydroxy (Completed)       Note: This dictation was prepared with Dragon dictation along with smaller phrase technology. Any transcriptional errors that result from this process are unintentional.

## 2013-08-30 LAB — VITAMIN D 25 HYDROXY (VIT D DEFICIENCY, FRACTURES): VIT D 25 HYDROXY: 13 ng/mL — AB (ref 30–89)

## 2013-08-31 ENCOUNTER — Encounter: Payer: Self-pay | Admitting: Family Medicine

## 2013-09-02 ENCOUNTER — Other Ambulatory Visit: Payer: Self-pay | Admitting: *Deleted

## 2013-09-02 MED ORDER — VITAMIN D (ERGOCALCIFEROL) 1.25 MG (50000 UNIT) PO CAPS: 50000.0000 [IU] | ORAL_CAPSULE | ORAL | Status: DC

## 2013-12-30 ENCOUNTER — Ambulatory Visit: Payer: BC Managed Care – PPO | Admitting: Family Medicine

## 2014-01-14 ENCOUNTER — Encounter: Payer: Self-pay | Admitting: Family Medicine

## 2014-01-14 MED ORDER — BUTALBITAL-APAP-CAFF-COD 50-325-40-30 MG PO CAPS: 1.0000 | ORAL_CAPSULE | Freq: Four times a day (QID) | ORAL | Status: DC | PRN

## 2014-01-14 NOTE — Telephone Encounter (Signed)
Medication called to pharmacy. 

## 2014-01-20 ENCOUNTER — Ambulatory Visit (INDEPENDENT_AMBULATORY_CARE_PROVIDER_SITE_OTHER): Payer: BC Managed Care – PPO | Admitting: Family Medicine

## 2014-01-20 ENCOUNTER — Encounter: Payer: Self-pay | Admitting: Family Medicine

## 2014-01-20 VITALS — BP 144/80 | HR 72 | Temp 97.8°F | Resp 14 | Ht 61.0 in | Wt 172.0 lb

## 2014-01-20 DIAGNOSIS — G43709 Chronic migraine without aura, not intractable, without status migrainosus: Secondary | ICD-10-CM

## 2014-01-20 DIAGNOSIS — Z23 Encounter for immunization: Secondary | ICD-10-CM

## 2014-01-20 DIAGNOSIS — E669 Obesity, unspecified: Secondary | ICD-10-CM

## 2014-01-20 DIAGNOSIS — G43909 Migraine, unspecified, not intractable, without status migrainosus: Secondary | ICD-10-CM | POA: Insufficient documentation

## 2014-01-20 DIAGNOSIS — J069 Acute upper respiratory infection, unspecified: Secondary | ICD-10-CM

## 2014-01-20 DIAGNOSIS — I10 Essential (primary) hypertension: Secondary | ICD-10-CM

## 2014-01-20 DIAGNOSIS — E785 Hyperlipidemia, unspecified: Secondary | ICD-10-CM

## 2014-01-20 LAB — COMPREHENSIVE METABOLIC PANEL
ALT: 30 U/L (ref 0–35)
AST: 27 U/L (ref 0–37)
Albumin: 4.4 g/dL (ref 3.5–5.2)
Alkaline Phosphatase: 81 U/L (ref 39–117)
BUN: 12 mg/dL (ref 6–23)
CALCIUM: 9.8 mg/dL (ref 8.4–10.5)
CHLORIDE: 100 meq/L (ref 96–112)
CO2: 27 mEq/L (ref 19–32)
CREATININE: 0.55 mg/dL (ref 0.50–1.10)
GLUCOSE: 95 mg/dL (ref 70–99)
POTASSIUM: 4 meq/L (ref 3.5–5.3)
Sodium: 136 mEq/L (ref 135–145)
Total Bilirubin: 0.6 mg/dL (ref 0.2–1.2)
Total Protein: 7.4 g/dL (ref 6.0–8.3)

## 2014-01-20 LAB — CBC WITH DIFFERENTIAL/PLATELET
Basophils Absolute: 0.1 10*3/uL (ref 0.0–0.1)
Basophils Relative: 1 % (ref 0–1)
EOS PCT: 2 % (ref 0–5)
Eosinophils Absolute: 0.2 10*3/uL (ref 0.0–0.7)
HCT: 43.9 % (ref 36.0–46.0)
HEMOGLOBIN: 14.8 g/dL (ref 12.0–15.0)
LYMPHS PCT: 26 % (ref 12–46)
Lymphs Abs: 3 10*3/uL (ref 0.7–4.0)
MCH: 27.9 pg (ref 26.0–34.0)
MCHC: 33.7 g/dL (ref 30.0–36.0)
MCV: 82.8 fL (ref 78.0–100.0)
MONOS PCT: 5 % (ref 3–12)
Monocytes Absolute: 0.6 10*3/uL (ref 0.1–1.0)
NEUTROS ABS: 7.6 10*3/uL (ref 1.7–7.7)
Neutrophils Relative %: 66 % (ref 43–77)
Platelets: 330 10*3/uL (ref 150–400)
RBC: 5.3 MIL/uL — ABNORMAL HIGH (ref 3.87–5.11)
RDW: 14.2 % (ref 11.5–15.5)
WBC: 11.5 10*3/uL — ABNORMAL HIGH (ref 4.0–10.5)

## 2014-01-20 LAB — LIPID PANEL
CHOL/HDL RATIO: 3.9 ratio
Cholesterol: 205 mg/dL — ABNORMAL HIGH (ref 0–200)
HDL: 53 mg/dL (ref >39)
LDL Cholesterol: 125 mg/dL — ABNORMAL HIGH (ref 0–99)
TRIGLYCERIDES: 136 mg/dL (ref <150)
VLDL: 27 mg/dL (ref 0–40)

## 2014-01-20 MED ORDER — METOPROLOL SUCCINATE ER 100 MG PO TB24: 100.0000 mg | ORAL_TABLET | Freq: Every day | ORAL | Status: DC

## 2014-01-20 NOTE — Addendum Note (Signed)
Addended by: Phillips OdorSIX, Valinda Fedie H on: 01/20/2014 10:16 AM   Modules accepted: Orders

## 2014-01-20 NOTE — Assessment & Plan Note (Signed)
Declines further medications, will use herbals and heat to neck

## 2014-01-20 NOTE — Assessment & Plan Note (Signed)
Supportive care, improving

## 2014-01-20 NOTE — Patient Instructions (Addendum)
Toprol increased to 100mg  once a day  Continue HCTZ  Take vitamin 1000IU once a day  F/U 4 months

## 2014-01-20 NOTE — Progress Notes (Signed)
Patient ID: Jennifer GriffithAnjli A Welge, female   DOB: 01/12/1971, [age] y.o.   MRN: [id number]   Subjective:    Patient ID: Jennifer Diaz, female    DOB: 01/12/1971, 43 y.o.   MRN: [id number]  Patient presents for F/U HTN and Migraines  Patient here to followup hypertension. She sent in a patient female and she had some sinus pressure and congestion as well as a migraine headache. She typically has migraines 1-2 times a month with photophobia, no phonophobia, occasional blurred vision but does not like using medications for her. This time however because of the contour and upper respiratory illness she did try some over-the-counter meds which was not helping. She was also taking decongestant such as Sudafed and her blood pressure started to elevate she states it got up to 187/100. She's currently taking metoprolol 100 mg I advised her to stop the decongestants and increase her metoprolol. She's not taken her meds today. Her blood pressures the past couple of days have been 130s to 140s over 80s to 90s on the metoprolol and hydrochlorothiazide. She did take a dose of the Fiorocet with codeine however the codeine made her very loopy therefore she discontinued it.   Review Of Systems:  GEN- denies fatigue, fever, weight loss,weakness, recent illness HEENT- denies eye drainage, change in vision, nasal discharge, CVS- denies chest pain, palpitations RESP- denies SOB, cough, wheeze ABD- denies N/V, change in stools, abd pain GU- denies dysuria, hematuria, dribbling, incontinence MSK- denies joint pain, muscle aches, injury Neuro- + headache, dizziness, syncope, seizure activity       Objective:    BP 144/80  Pulse 72  Temp(Src) 97.8 F (36.6 C) (Oral)  Resp 14  Ht 5\' 1"  (1.549 m)  Wt 172 lb (78.019 kg)  BMI 32.52 kg/m2  LMP 12/29/2013 GEN- NAD, alert and oriented x3  Repeat BP 162/100 HEENT- PERRL, EOMI, non injected sclera, pink conjunctiva, MMM, oropharynx clear, TM clear bilat no effusion, nares  clear, fundus benign  Neck- Supple, no LAD CVS- RRR, no murmur RESP-CTAB EXT- No edema Pulses- Radial 2+ Neuro- CNII-XIIin tact no deficits        Assessment & Plan:      Problem List Items Addressed This Visit   Hyperlipidemia - Primary   Relevant Medications      metoprolol succinate (TOPROL-XL) 24 hr tablet   Other Relevant Orders      Lipid panel   Essential hypertension   Relevant Medications      metoprolol succinate (TOPROL-XL) 24 hr tablet   Other Relevant Orders      CBC with Differential      Comprehensive metabolic panel      Note: This dictation was prepared with Dragon dictation along with smaller phrase technology. Any transcriptional errors that result from this process are unintentional.

## 2014-01-20 NOTE — Assessment & Plan Note (Signed)
Blood pressure is elevated today however she's not had her meds but she is very reliable regarding her home readings therefore will continue her on the 100 mg of metoprolol and HCTZ. Getting her total blood pressure 140/90 would be a significant improvement over her past readings. We'll also obtain fasting labs she's here today. Advised her not to use any over-the-counter decongestants as this increases her blood pressure

## 2014-02-03 ENCOUNTER — Other Ambulatory Visit: Payer: Self-pay | Admitting: Family Medicine

## 2014-02-03 DIAGNOSIS — Z139 Encounter for screening, unspecified: Secondary | ICD-10-CM

## 2014-02-18 ENCOUNTER — Ambulatory Visit (HOSPITAL_COMMUNITY)
Admission: RE | Admit: 2014-02-18 | Discharge: 2014-02-18 | Disposition: A | Discharge status: Home / Self Care | Payer: BC Managed Care – PPO | Source: Ambulatory Visit | Attending: Family Medicine | Admitting: Family Medicine

## 2014-02-18 DIAGNOSIS — Z139 Encounter for screening, unspecified: Secondary | ICD-10-CM

## 2014-02-18 DIAGNOSIS — Z1231 Encounter for screening mammogram for malignant neoplasm of breast: Secondary | ICD-10-CM | POA: Diagnosis not present

## 2014-05-05 ENCOUNTER — Encounter: Payer: Self-pay | Admitting: Family Medicine

## 2014-06-10 ENCOUNTER — Encounter: Payer: Self-pay | Admitting: Family Medicine

## 2014-06-24 ENCOUNTER — Other Ambulatory Visit: Payer: Self-pay | Admitting: Family Medicine

## 2014-06-24 DIAGNOSIS — E785 Hyperlipidemia, unspecified: Secondary | ICD-10-CM

## 2014-06-24 DIAGNOSIS — I10 Essential (primary) hypertension: Secondary | ICD-10-CM

## 2014-06-24 DIAGNOSIS — Z23 Encounter for immunization: Secondary | ICD-10-CM

## 2014-06-24 DIAGNOSIS — E669 Obesity, unspecified: Secondary | ICD-10-CM

## 2014-06-24 DIAGNOSIS — G43709 Chronic migraine without aura, not intractable, without status migrainosus: Secondary | ICD-10-CM

## 2014-06-24 DIAGNOSIS — J069 Acute upper respiratory infection, unspecified: Secondary | ICD-10-CM

## 2014-06-25 MED ORDER — METOPROLOL SUCCINATE ER 100 MG PO TB24: 100.0000 mg | ORAL_TABLET | Freq: Every day | ORAL | Status: DC

## 2014-06-25 NOTE — Telephone Encounter (Signed)
Medication filled x1 with no refills.   Requires office visit before any further refills can be given.   Letter sent.  

## 2014-08-02 ENCOUNTER — Encounter: Payer: Self-pay | Admitting: Family Medicine

## 2014-08-03 ENCOUNTER — Ambulatory Visit (INDEPENDENT_AMBULATORY_CARE_PROVIDER_SITE_OTHER): Payer: BLUE CROSS/BLUE SHIELD | Admitting: Family Medicine

## 2014-08-03 ENCOUNTER — Encounter: Payer: Self-pay | Admitting: Family Medicine

## 2014-08-03 ENCOUNTER — Ambulatory Visit: Payer: Self-pay | Admitting: Family Medicine

## 2014-08-03 VITALS — BP 130/74 | HR 76 | Temp 98.3°F | Resp 14 | Ht 61.0 in | Wt 176.0 lb

## 2014-08-03 DIAGNOSIS — K120 Recurrent oral aphthae: Secondary | ICD-10-CM

## 2014-08-03 DIAGNOSIS — I10 Essential (primary) hypertension: Secondary | ICD-10-CM

## 2014-08-03 LAB — CBC WITH DIFFERENTIAL/PLATELET
BASOS ABS: 0.1 10*3/uL (ref 0.0–0.1)
Basophils Relative: 1 % (ref 0–1)
EOS ABS: 0.2 10*3/uL (ref 0.0–0.7)
EOS PCT: 2 % (ref 0–5)
HCT: 44.4 % (ref 36.0–46.0)
Hemoglobin: 14.8 g/dL (ref 12.0–15.0)
LYMPHS PCT: 30 % (ref 12–46)
Lymphs Abs: 3.3 10*3/uL (ref 0.7–4.0)
MCH: 27.8 pg (ref 26.0–34.0)
MCHC: 33.3 g/dL (ref 30.0–36.0)
MCV: 83.5 fL (ref 78.0–100.0)
MONOS PCT: 5 % (ref 3–12)
MPV: 9.4 fL (ref 8.6–12.4)
Monocytes Absolute: 0.5 10*3/uL (ref 0.1–1.0)
Neutro Abs: 6.8 10*3/uL (ref 1.7–7.7)
Neutrophils Relative %: 62 % (ref 43–77)
Platelets: 313 10*3/uL (ref 150–400)
RBC: 5.32 MIL/uL — ABNORMAL HIGH (ref 3.87–5.11)
RDW: 14.7 % (ref 11.5–15.5)
WBC: 10.9 10*3/uL — AB (ref 4.0–10.5)

## 2014-08-03 MED ORDER — METHYLPREDNISOLONE ACETATE 40 MG/ML IJ SUSP
40.0000 mg | Freq: Once | INTRAMUSCULAR | Status: AC
  Administered 2014-08-03: 40 mg via INTRAMUSCULAR

## 2014-08-03 MED ORDER — LIDOCAINE VISCOUS 2 % MT SOLN: 10.0000 mL | OROMUCOSAL | Status: DC | PRN

## 2014-08-03 MED ORDER — TRIAMCINOLONE ACETONIDE 0.1 % MT PSTE: 1.0000 "application " | PASTE | Freq: Three times a day (TID) | OROMUCOSAL | Status: DC

## 2014-08-03 NOTE — Assessment & Plan Note (Signed)
Well controlled, check fasting labs today

## 2014-08-03 NOTE — Progress Notes (Signed)
Patient ID: Jennifer Diaz, female   DOB: 1971-04-08, 44 y.o.   MRN: [id number]   Subjective:    Patient ID: Jennifer Diaz, female    DOB: 1971-04-08, 44 y.o.   MRN: [id number]  Patient presents for Mouth Irritation  patient here with ulcers in her mouth. Last Wednesday she ate pizza and some new cookies after that she has swelling of her lips and a Roth feeling in her mouth. She then noticed some ulcers in her mouth. She's not had any fever no recent illness and nausea and vomiting associated. She's not had any fever. She did not have any blisters. She still has swelling around her lips as well. She has significant pain when she tries to eat.   She has been taking her blood pressure medicine as prescribed    Review Of Systems: per above   GEN- denies fatigue, fever, weight loss,weakness, recent illness HEENT- denies eye drainage, change in vision, nasal discharge, CVS- denies chest pain, palpitations RESP- denies SOB, cough, wheeze ABD- denies N/V, change in stools, abd pain GU- denies dysuria, hematuria, dribbling, incontinence MSK- denies joint pain, muscle aches, injury Neuro- denies headache, dizziness, syncope, seizure activity       Objective:    BP 130/74 mmHg  Pulse 76  Temp(Src) 98.3 F (36.8 C) (Oral)  Resp 14  Ht 5\' 1"  (1.549 m)  Wt 176 lb (79.833 kg)  BMI 33.27 kg/m2  LMP 07/20/2014 GEN- NAD, alert and oriented x3 HEENT- PERRL, EOMI, non injected sclera, pink conjunctiva, MMM, post oropharynx clear, no lesions roof of mouth, 3 shallow based gray ulcerations 2 on lower lip right side, other larger lesion upper left lip mucousa TTP, erythema also noted Neck- Supple, no LAD Skin- few tiny maculopapular lesions around mouth       Assessment & Plan:      Problem List Items Addressed This Visit    Essential hypertension    Other Visit Diagnoses    Aphthous ulcer of mouth    -  Primary    Unclear cause, doubt allergic reaction to cookie, most likley viral  associated or stress. Check HSV, given lidocaine rinse, orabase, Depo Medrol shot given for mouth swelling    Relevant Medications    methylPREDNISolone acetate (DEPO-MEDROL) injection 40 mg (Completed)    Other Relevant Orders    HSV(herpes simplex vrs) 1+2 ab-IgG    CBC with Differential/Platelet    Comprehensive metabolic panel       Note: This dictation was prepared with Dragon dictation along with smaller phrase technology. Any transcriptional errors that result from this process are unintentional.

## 2014-08-03 NOTE — Patient Instructions (Signed)
Use the lidocaine rinse Use the topical cream We will call with lab results Steroid shot given F/U 6 months

## 2014-08-04 LAB — COMPREHENSIVE METABOLIC PANEL
ALBUMIN: 4.1 g/dL (ref 3.5–5.2)
ALT: 36 U/L — ABNORMAL HIGH (ref 0–35)
AST: 27 U/L (ref 0–37)
Alkaline Phosphatase: 81 U/L (ref 39–117)
BUN: 11 mg/dL (ref 6–23)
CALCIUM: 9.8 mg/dL (ref 8.4–10.5)
CO2: 28 mEq/L (ref 19–32)
CREATININE: 0.71 mg/dL (ref 0.50–1.10)
Chloride: 100 mEq/L (ref 96–112)
GLUCOSE: 87 mg/dL (ref 70–99)
Potassium: 4.3 mEq/L (ref 3.5–5.3)
SODIUM: 137 meq/L (ref 135–145)
TOTAL PROTEIN: 7.3 g/dL (ref 6.0–8.3)
Total Bilirubin: 0.6 mg/dL (ref 0.2–1.2)

## 2014-08-05 ENCOUNTER — Encounter: Payer: Self-pay | Admitting: Family Medicine

## 2014-08-05 LAB — HSV(HERPES SIMPLEX VRS) I + II AB-IGG: HSV 1 GLYCOPROTEIN G AB, IGG: 0.23 IV

## 2014-08-21 ENCOUNTER — Encounter: Payer: Self-pay | Admitting: Family Medicine

## 2014-08-21 ENCOUNTER — Other Ambulatory Visit: Payer: Self-pay | Admitting: Family Medicine

## 2014-08-21 DIAGNOSIS — M25559 Pain in unspecified hip: Principal | ICD-10-CM

## 2014-08-21 DIAGNOSIS — M7070 Other bursitis of hip, unspecified hip: Secondary | ICD-10-CM

## 2014-08-21 DIAGNOSIS — G8929 Other chronic pain: Secondary | ICD-10-CM

## 2014-08-26 ENCOUNTER — Encounter: Payer: Self-pay | Admitting: *Deleted

## 2014-08-30 ENCOUNTER — Other Ambulatory Visit: Payer: Self-pay | Admitting: Family Medicine

## 2014-08-31 NOTE — Telephone Encounter (Signed)
Refill appropriate and filled per protocol. 

## 2014-09-21 ENCOUNTER — Encounter: Payer: Self-pay | Admitting: Family Medicine

## 2014-09-21 ENCOUNTER — Ambulatory Visit (INDEPENDENT_AMBULATORY_CARE_PROVIDER_SITE_OTHER): Payer: BLUE CROSS/BLUE SHIELD | Admitting: Family Medicine

## 2014-09-21 VITALS — BP 138/90 | HR 76 | Temp 98.8°F | Resp 16 | Ht 61.0 in | Wt 179.0 lb

## 2014-09-21 DIAGNOSIS — Z91048 Other nonmedicinal substance allergy status: Secondary | ICD-10-CM | POA: Diagnosis not present

## 2014-09-21 DIAGNOSIS — T7840XA Allergy, unspecified, initial encounter: Secondary | ICD-10-CM

## 2014-09-21 DIAGNOSIS — Z9109 Other allergy status, other than to drugs and biological substances: Secondary | ICD-10-CM

## 2014-09-21 MED ORDER — METHYLPREDNISOLONE 4 MG PO TBPK: ORAL_TABLET | ORAL | Status: DC

## 2014-09-21 MED ORDER — HYDROXYZINE HCL 25 MG PO TABS: 25.0000 mg | ORAL_TABLET | Freq: Three times a day (TID) | ORAL | Status: DC | PRN

## 2014-09-21 NOTE — Patient Instructions (Signed)
Start steroids tomorrow  Steroid shot given today  Hydroxyzine for itching Referral to dermatology

## 2014-09-21 NOTE — Progress Notes (Signed)
Patient ID: Jennifer Diaz, female   DOB: 26-Jun-1970, 44 y.o.   MRN: [id number]     Subjective:    Patient ID: Jennifer Diaz, female    DOB: 17-Jan-1971, 44 y.o.   MRN: [id number]  Patient presents for Allergic Reaction   Patient here with swelling of her lips and face she also has blistering rash on her hands. She states that she was in her normal state of health yesterday she put some prescription lip balm on it was going out to the [facility] with her family when she began noticing some tingling in her lips subsequently she had swelling of her lips and face she did not have any difficulty breathing she again began having itching on her hands and noticed some red spots show up on her palm she also has blisters on a couple of fingers and a red spot on her right foot. She denies any fever no recent illness. She does now recall that she ate the same type of cookies that were imported from [place] when she came in here last time which is about a month ago with allergic reaction and ulcers in her mouth. She has been seen by dermatologist in the past she tried calling around to see a dermatologist now and they would not take her at this time due to the acute reaction    Review Of Systems:  GEN- denies fatigue, fever, weight loss,weakness, recent illness HEENT- denies eye drainage, change in vision, nasal discharge, CVS- denies chest pain, palpitations RESP- denies SOB, cough, wheeze ABD- denies N/V, change in stools, abd pain GU- denies dysuria, hematuria, dribbling, incontinence MSK- denies joint pain, muscle aches, injury Neuro- denies headache, dizziness, syncope, seizure activity       Objective:    BP 138/90 mmHg  Pulse 76  Temp(Src) 98.8 F (37.1 C) (Oral)  Resp 16  Ht 5\' 1"  (1.549 m)  Wt 179 lb (81.194 kg)  BMI 33.84 kg/m2 GEN- NAD, alert and oriented x3 HEENT- PERRL, EOMI, non injected sclera, pink conjunctiva, MMM, oropharynx clear no ulcerations- uvuvula midline swelling  upper and lower lips, swelling of cheeks, nares clear Neck- Supple, no LAD CVS- RRR, no murmur RESP-CTAB Skin- bilat hands- erythematous macules scattered in hands- plams, 2 lesions on right foot, isolated blister on thumb , 1st and 2nd digit EXT- No edema Pulses- Radial, DP- 2+        Assessment & Plan:      Problem List Items Addressed This Visit    None    Visit Diagnoses    Allergic reaction, initial encounter    -  Primary    Possible cookies as culprit- though she states she has had in past, also changed to an almond soap. Advised to d/c both, Depo Medrol 80mg  IM given, followed with prednisone taper, atarax given for itching. Refer to dermatology will need allergy work-up       Note: This dictation was prepared with Dragon dictation along with smaller phrase technology. Any transcriptional errors that result from this process are unintentional.

## 2014-09-22 ENCOUNTER — Encounter: Payer: Self-pay | Admitting: *Deleted

## 2014-10-14 ENCOUNTER — Encounter: Payer: Self-pay | Admitting: *Deleted

## 2014-10-29 ENCOUNTER — Encounter: Payer: Self-pay | Admitting: Family Medicine

## 2014-10-29 ENCOUNTER — Other Ambulatory Visit: Payer: Self-pay | Admitting: Family Medicine

## 2014-10-30 MED ORDER — GUAIFENESIN-CODEINE 100-10 MG/5ML PO SOLN: 5.0000 mL | Freq: Three times a day (TID) | ORAL | Status: DC | PRN

## 2014-10-30 NOTE — Telephone Encounter (Signed)
Medication refilled per protocol. 

## 2015-01-06 ENCOUNTER — Encounter: Payer: Self-pay | Admitting: Family Medicine

## 2015-01-07 ENCOUNTER — Other Ambulatory Visit: Payer: Self-pay | Admitting: Family Medicine

## 2015-01-08 ENCOUNTER — Encounter: Payer: Self-pay | Admitting: Family Medicine

## 2015-01-08 ENCOUNTER — Ambulatory Visit (INDEPENDENT_AMBULATORY_CARE_PROVIDER_SITE_OTHER): Payer: BLUE CROSS/BLUE SHIELD | Admitting: Family Medicine

## 2015-01-08 ENCOUNTER — Ambulatory Visit (HOSPITAL_COMMUNITY)
Admission: RE | Admit: 2015-01-08 | Discharge: 2015-01-08 | Disposition: A | Discharge status: Home / Self Care | Payer: BLUE CROSS/BLUE SHIELD | Source: Ambulatory Visit | Attending: Family Medicine | Admitting: Family Medicine

## 2015-01-08 VITALS — BP 132/66 | HR 78 | Temp 97.9°F | Resp 14 | Ht 61.0 in | Wt 186.0 lb

## 2015-01-08 DIAGNOSIS — N938 Other specified abnormal uterine and vaginal bleeding: Secondary | ICD-10-CM | POA: Diagnosis not present

## 2015-01-08 DIAGNOSIS — R053 Chronic cough: Secondary | ICD-10-CM

## 2015-01-08 DIAGNOSIS — R05 Cough: Secondary | ICD-10-CM

## 2015-01-08 DIAGNOSIS — E785 Hyperlipidemia, unspecified: Secondary | ICD-10-CM | POA: Diagnosis not present

## 2015-01-08 DIAGNOSIS — R0602 Shortness of breath: Secondary | ICD-10-CM | POA: Diagnosis not present

## 2015-01-08 DIAGNOSIS — I10 Essential (primary) hypertension: Secondary | ICD-10-CM | POA: Diagnosis not present

## 2015-01-08 LAB — COMPREHENSIVE METABOLIC PANEL
ALBUMIN: 4.2 g/dL (ref 3.6–5.1)
ALT: 63 U/L — ABNORMAL HIGH (ref 6–29)
AST: 42 U/L — AB (ref 10–35)
Alkaline Phosphatase: 88 U/L (ref 33–115)
BILIRUBIN TOTAL: 0.6 mg/dL (ref 0.2–1.2)
BUN: 7 mg/dL (ref 7–25)
CALCIUM: 9.1 mg/dL (ref 8.6–10.2)
CHLORIDE: 96 mmol/L — AB (ref 98–110)
CO2: 23 mmol/L (ref 20–31)
Creat: 0.6 mg/dL (ref 0.50–1.10)
Glucose, Bld: 116 mg/dL — ABNORMAL HIGH (ref 70–99)
Potassium: 3.3 mmol/L — ABNORMAL LOW (ref 3.5–5.3)
Sodium: 134 mmol/L — ABNORMAL LOW (ref 135–146)
TOTAL PROTEIN: 7.1 g/dL (ref 6.1–8.1)

## 2015-01-08 LAB — CBC WITH DIFFERENTIAL/PLATELET
BASOS PCT: 1 % (ref 0–1)
Basophils Absolute: 0.1 10*3/uL (ref 0.0–0.1)
EOS ABS: 0.4 10*3/uL (ref 0.0–0.7)
Eosinophils Relative: 3 % (ref 0–5)
HCT: 41.7 % (ref 36.0–46.0)
Hemoglobin: 14.3 g/dL (ref 12.0–15.0)
Lymphocytes Relative: 25 % (ref 12–46)
Lymphs Abs: 3.3 10*3/uL (ref 0.7–4.0)
MCH: 29.1 pg (ref 26.0–34.0)
MCHC: 34.3 g/dL (ref 30.0–36.0)
MCV: 84.9 fL (ref 78.0–100.0)
MONO ABS: 0.7 10*3/uL (ref 0.1–1.0)
MONOS PCT: 5 % (ref 3–12)
MPV: 9.6 fL (ref 8.6–12.4)
Neutro Abs: 8.7 10*3/uL — ABNORMAL HIGH (ref 1.7–7.7)
Neutrophils Relative %: 66 % (ref 43–77)
PLATELETS: 296 10*3/uL (ref 150–400)
RBC: 4.91 MIL/uL (ref 3.87–5.11)
RDW: 14.3 % (ref 11.5–15.5)
WBC: 13.2 10*3/uL — ABNORMAL HIGH (ref 4.0–10.5)

## 2015-01-08 LAB — TSH: TSH: 3.197 u[IU]/mL (ref 0.350–4.500)

## 2015-01-08 MED ORDER — CETIRIZINE HCL 10 MG PO TABS: 10.0000 mg | ORAL_TABLET | Freq: Every day | ORAL | Status: DC

## 2015-01-08 MED ORDER — PREDNISONE 20 MG PO TABS: 40.0000 mg | ORAL_TABLET | Freq: Every day | ORAL | Status: DC

## 2015-01-08 NOTE — Patient Instructions (Addendum)
Call [facility] for the bleeding  Get the chest xray done  We will call with labs  F/U pending results

## 2015-01-08 NOTE — Telephone Encounter (Signed)
Refill appropriate and filled per protocol. 

## 2015-01-08 NOTE — Progress Notes (Signed)
Patient ID: Jennifer Diaz, female   DOB: 09-09-1970, 44 y.o.   MRN: [id number]   Subjective:    Patient ID: Jennifer Diaz, female    DOB: Oct 31, 1970, 44 y.o.   MRN: [id number]  Patient presents for Cough  patient here with cough for the past 2 months. Her cough is a dry cough feels more irritated. In the morning she will have a lot of phlegm but otherwise none during the day. She's tried over-the-counter as well as home remedies for the cough without any improvement. She denies any acid reflux or heartburn symptoms. She does not typically have allergies. She gets into more coughing fits. She is a nonsmoker. Denies any shortness of breath but sometimes gets a tightness when she has coughing. Denies any chest pain.  For the past 4-6 months she's also had a menstrual cycle about every 2 weeks. She has not followed up with her GYN. Her Pap smear was last done in 2013 she is overdue for this as well. Denies vaginal discharge or pelvic pain, does get bloating    Review Of Systems:  GEN- denies fatigue, fever, weight loss,weakness, recent illness HEENT- denies eye drainage, change in vision, nasal discharge, CVS- denies chest pain, palpitations RESP- denies SOB, cough, wheeze ABD- denies N/V, change in stools, abd pain GU- denies dysuria, hematuria, dribbling, incontinence MSK- denies joint pain, muscle aches, injury Neuro- denies headache, dizziness, syncope, seizure activity       Objective:    BP 132/66 mmHg  Pulse 78  Temp(Src) 97.9 F (36.6 C) (Oral)  Resp 14  Ht  (1.549 m)  Wt 186 lb (84.369 kg)  BMI 35.16 kg/m2  SpO2 97%  LMP 12/24/2014 (Approximate) GEN- NAD, alert and oriented x3 HEENT- PERRL, EOMI, non injected sclera, pink conjunctiva, MMM, oropharynx clear, nares clear  Neck- Supple, no LAD CVS- RRR, no murmur RESP-CTAB, irritated cough, normal WOB, normal sat  ABD-NABS,soft,NT,ND EXT- No edema Pulses- Radial - 2+        Assessment & Plan:       Problem List Items Addressed This Visit    None    Visit Diagnoses    Chronic cough    -  Primary    Sounds more bronchitic, doubt medications cause, non smoker.Obtain CXR, if neg, trial of anti-histamine, prednisone burst, cough syrup with codiene    Relevant Orders    DG Chest 2 View    DUB (dysfunctional uterine bleeding)        obtain CBC and hormone levels, advised to call GYN, may need endometrial biopsy, she will call today    Relevant Orders    CBC with Differential/Platelet    FSH/LH    TSH    Comprehensive metabolic panel       Note: This dictation was prepared with Dragon dictation along with smaller phrase technology. Any transcriptional errors that result from this process are unintentional.

## 2015-01-09 LAB — FSH/LH
FSH: 3.3 m[IU]/mL
LH: 5.6 m[IU]/mL

## 2015-01-11 ENCOUNTER — Other Ambulatory Visit: Payer: Self-pay | Admitting: *Deleted

## 2015-01-11 DIAGNOSIS — E876 Hypokalemia: Secondary | ICD-10-CM

## 2015-01-13 ENCOUNTER — Encounter: Payer: Self-pay | Admitting: Family Medicine

## 2015-01-13 MED ORDER — ALBUTEROL SULFATE HFA 108 (90 BASE) MCG/ACT IN AERS: 2.0000 | INHALATION_SPRAY | Freq: Four times a day (QID) | RESPIRATORY_TRACT | Status: DC | PRN

## 2015-01-13 MED ORDER — AZITHROMYCIN 250 MG PO TABS: ORAL_TABLET | ORAL | Status: DC

## 2015-01-13 MED ORDER — PREDNISONE 10 MG PO TABS: ORAL_TABLET | ORAL | Status: DC

## 2015-01-13 NOTE — Telephone Encounter (Signed)
Prescription sent to pharmacy per MD recommendations.

## 2015-01-30 ENCOUNTER — Encounter (HOSPITAL_COMMUNITY): Payer: Self-pay | Admitting: *Deleted

## 2015-01-30 ENCOUNTER — Emergency Department (HOSPITAL_COMMUNITY)
Admission: EM | Admit: 2015-01-30 | Discharge: 2015-01-30 | Disposition: A | Discharge status: Home / Self Care | Payer: BLUE CROSS/BLUE SHIELD | Attending: Emergency Medicine | Admitting: Emergency Medicine

## 2015-01-30 DIAGNOSIS — R000 Tachycardia, unspecified: Secondary | ICD-10-CM | POA: Insufficient documentation

## 2015-01-30 DIAGNOSIS — Y998 Other external cause status: Secondary | ICD-10-CM | POA: Diagnosis not present

## 2015-01-30 DIAGNOSIS — T783XXA Angioneurotic edema, initial encounter: Secondary | ICD-10-CM | POA: Diagnosis not present

## 2015-01-30 DIAGNOSIS — Z8669 Personal history of other diseases of the nervous system and sense organs: Secondary | ICD-10-CM | POA: Diagnosis not present

## 2015-01-30 DIAGNOSIS — Z79899 Other long term (current) drug therapy: Secondary | ICD-10-CM | POA: Diagnosis not present

## 2015-01-30 DIAGNOSIS — X58XXXA Exposure to other specified factors, initial encounter: Secondary | ICD-10-CM | POA: Insufficient documentation

## 2015-01-30 DIAGNOSIS — Z8639 Personal history of other endocrine, nutritional and metabolic disease: Secondary | ICD-10-CM | POA: Insufficient documentation

## 2015-01-30 DIAGNOSIS — Y9389 Activity, other specified: Secondary | ICD-10-CM | POA: Diagnosis not present

## 2015-01-30 DIAGNOSIS — Y9289 Other specified places as the place of occurrence of the external cause: Secondary | ICD-10-CM | POA: Insufficient documentation

## 2015-01-30 DIAGNOSIS — I10 Essential (primary) hypertension: Secondary | ICD-10-CM | POA: Diagnosis not present

## 2015-01-30 DIAGNOSIS — R220 Localized swelling, mass and lump, head: Secondary | ICD-10-CM | POA: Diagnosis present

## 2015-01-30 MED ORDER — PREDNISONE 10 MG PO TABS: 40.0000 mg | ORAL_TABLET | Freq: Every day | ORAL | Status: DC

## 2015-01-30 MED ORDER — DIPHENHYDRAMINE HCL 50 MG/ML IJ SOLN
50.0000 mg | Freq: Once | INTRAMUSCULAR | Status: AC
  Administered 2015-01-30: 50 mg via INTRAVENOUS
  Filled 2015-01-30: qty 1

## 2015-01-30 MED ORDER — FAMOTIDINE 40 MG PO TABS: 40.0000 mg | ORAL_TABLET | Freq: Every day | ORAL | Status: DC

## 2015-01-30 MED ORDER — FAMOTIDINE IN NACL 20-0.9 MG/50ML-% IV SOLN
20.0000 mg | Freq: Once | INTRAVENOUS | Status: AC
  Administered 2015-01-30: 20 mg via INTRAVENOUS
  Filled 2015-01-30: qty 50

## 2015-01-30 MED ORDER — METHYLPREDNISOLONE SODIUM SUCC 125 MG IJ SOLR
125.0000 mg | Freq: Once | INTRAMUSCULAR | Status: AC
  Administered 2015-01-30: 125 mg via INTRAVENOUS
  Filled 2015-01-30: qty 2

## 2015-01-30 NOTE — ED Notes (Signed)
Swelling noted to lips. Pt reports of pain to throat. Denies difficulty swallowing/SOB. NADN.

## 2015-01-30 NOTE — ED Notes (Signed)
MD at bedside. 

## 2015-01-30 NOTE — ED Notes (Signed)
PA at bedside for evaluation

## 2015-01-30 NOTE — ED Notes (Signed)
PA at bedside.

## 2015-01-30 NOTE — Discharge Instructions (Signed)
You have been seen for swelling in the face, or angioedema. This resolved after treatment here in the ED. You are advised to follow up with your allergist in the next two days. Follow up with PCP as needed. Return to ED should symptoms return.   Angioedema Angioedema is a sudden swelling of tissues, often of the skin. It can occur on the face or genitals or in the abdomen or other body parts. The swelling usually develops over a short period and gets better in 24 to 48 hours. It often begins during the night and is found when the person wakes up. The person may also get red, itchy patches of skin (hives). Angioedema can be dangerous if it involves swelling of the air passages.  Depending on the cause, episodes of angioedema may only happen once, come back in unpredictable patterns, or repeat for several years and then gradually fade away.  CAUSES  Angioedema can be caused by an allergic reaction to various triggers. It can also result from nonallergic causes, including reactions to drugs, immune system disorders, viral infections, or an abnormal gene that is passed to you from your parents (hereditary). For some people with angioedema, the cause is unknown.  Some things that can trigger angioedema include:   Foods.   Medicines, such as ACE inhibitors, ARBs, nonsteroidal anti-inflammatory agents, or estrogen.   Latex.   Animal saliva.   Insect stings.   Dyes used in X-rays.   Mild injury.   Dental work.  Surgery.  Stress.   Sudden changes in temperature.   Exercise. SIGNS AND SYMPTOMS   Swelling of the skin.  Hives. If these are present, there is also intense itching.  Redness in the affected area.   Pain in the affected area.  Swollen lips or tongue.  Breathing problems. This may happen if the air passages swell.  Wheezing. If internal organs are involved, there may be:   Nausea.   Abdominal pain.   Vomiting.   Difficulty swallowing.    Difficulty passing urine. DIAGNOSIS   Your health care provider will examine the affected area and take a medical and family history.  Various tests may be done to help determine the cause. Tests may include:  Allergy skin tests to see if the problem is an allergic reaction.   Blood tests to check for hereditary angioedema.   Tests to check for underlying diseases that could cause the condition.   A review of your medicines, including over-the-counter medicines, may be done. TREATMENT  Treatment will depend on the cause of the angioedema. Possible treatments include:   Removal of anything that triggered the condition (such as stopping certain medicines).   Medicines to treat symptoms or prevent attacks. Medicines given may include:   Antihistamines.   Epinephrine injection.   Steroids.   Hospitalization may be required for severe attacks. If the air passages are affected, it can be an emergency. Tubes may need to be placed to keep the airway open. HOME CARE INSTRUCTIONS   Take all medicines as directed by your health care provider.  If you were given medicines for emergency allergy treatment, always carry them with you.  Wear a medical bracelet as directed by your health care provider.   Avoid known triggers. SEEK MEDICAL CARE IF:   You have repeat attacks of angioedema.   Your attacks are more frequent or more severe despite preventive measures.   You have hereditary angioedema and are considering having children. It is important to discuss with  your health care provider the risks of passing the condition on to your children. SEEK IMMEDIATE MEDICAL CARE IF:   You have severe swelling of the mouth, tongue, or lips.  You have difficulty breathing.   You have difficulty swallowing.   You faint. MAKE SURE YOU:  Understand these instructions.  Will watch your condition.  Will get help right away if you are not doing well or get worse.   This  information is not intended to replace advice given to you by your health care provider. Make sure you discuss any questions you have with your health care provider.   Document Released: 06/05/2001 Document Revised: 04/17/2014 Document Reviewed: 11/18/2012 Elsevier Interactive Patient Education [employer]

## 2015-01-30 NOTE — ED Provider Notes (Signed)
CSN: [id number]     Arrival date & time 01/30/15  1856 History   First MD Initiated Contact with Patient 01/30/15 1917     Chief Complaint  Patient presents with  . Oral Swelling     (Consider location/radiation/quality/duration/timing/severity/associated sxs/prior Treatment) HPI   Jennifer Diaz is a 44 y.o. female, pt with no significant medication history, presents with spontaneous swelling in her lips, mouth, and tongue that began around 12:30pm today when pt was at rest. Pt states the last time she had anything to eat since last night. Denies any new medications, pets, or other allergen sources. Pt denies shortness of breath, N/V, chest pain, cough, or difficulty swallowing. Pt took 50 mg of benadryl at around 5:30 pm this evening, with little to no effect. Pt has seen an allergist before and was told that nothing could be done because she wasn't having an acute flare up at that time.  Past Medical History  Diagnosis Date  . Allergy   . Hypertension   . Hyperlipidemia   . Migraine   . Sleep apnea    Past Surgical History  Procedure Laterality Date  . Essure  April 2013    GYN- Dr. Algie Coffer   Family History  Problem Relation Age of Onset  . Diabetes Mother   . Hypertension Father   . Diabetes Brother    Social History  Substance Use Topics  . Smoking status: Never Smoker   . Smokeless tobacco: Never Used  . Alcohol Use: Yes     Comment: 4-6 drinks per month   OB History    No data available     Review of Systems  Constitutional: Negative for fever, chills, diaphoresis and fatigue.  HENT: Positive for facial swelling and sore throat. Negative for drooling and trouble swallowing.   Respiratory: Negative for cough, chest tightness, shortness of breath, wheezing and stridor.   Cardiovascular: Negative for chest pain and palpitations.  Skin: Negative for color change, pallor and rash.  Neurological: Negative for dizziness, syncope, weakness, light-headedness and  headaches.      Allergies  Lisinopril and Norvasc  Home Medications   Prior to Admission medications   Medication Sig Start Date End Date Taking? Authorizing Provider  hydrochlorothiazide (HYDRODIURIL) 25 MG tablet TAKE 1 TABLET BY MOUTH EVERY DAY 01/08/15  Yes Salley Scarlet, MD  metoprolol succinate (TOPROL-XL) 100 MG 24 hr tablet TAKE 1 TABLET BY MOUTH DAILY EITHER WITH OR IMMEDIATELY FOLLOWING A MEAL 10/30/14  Yes Salley Scarlet, MD  albuterol (PROVENTIL HFA;VENTOLIN HFA) 108 (90 BASE) MCG/ACT inhaler Inhale 2 puffs into the lungs every 6 (six) hours as needed for wheezing or shortness of breath. 01/13/15   Salley Scarlet, MD  azithromycin (ZITHROMAX) 250 MG tablet Take (2) tablets by mouth on day 1, then take (1) tablet by mouth on days 2-5. 01/13/15   Salley Scarlet, MD  cetirizine (ZYRTEC) 10 MG tablet Take 1 tablet (10 mg total) by mouth daily. 01/08/15   Salley Scarlet, MD  famotidine (PEPCID) 40 MG tablet Take 1 tablet (40 mg total) by mouth daily. 01/30/15   Jennifer Urizar C Jennifer Malone, PA-C  predniSONE (DELTASONE) 10 MG tablet Take 4 tablets ( ) on days 1-2. Take 2 tablets ( ) on days 3-4. Take 1 tablet ( ) on days 5-6, then stop. 01/13/15   Salley Scarlet, MD  predniSONE (DELTASONE) 10 MG tablet Take 4 tablets (40 mg total) by mouth daily. 01/30/15   Jennifer Pancoast, PA-C  BP 208/130 mmHg  Pulse 110  Temp(Src) 98.1 F (36.7 C) (Oral)  Resp 20  Ht 5\' 1"  (1.549 m)  Wt 144 lb (65.318 kg)  BMI 27.22 kg/m2  SpO2 100%  LMP 01/24/2015 Physical Exam  Constitutional: She appears well-developed and well-nourished. No distress.  HENT:  Head: Normocephalic and atraumatic.  Swelling noted to lips, inner buccal surface and tongue. No lesions noted. No drooling. Pt able to swallow. Tenderness to swollen tissue under mandible.   Eyes: Conjunctivae are normal. Pupils are equal, round, and reactive to light.  Cardiovascular: Regular rhythm and normal heart sounds.  Tachycardia present.    Pulmonary/Chest: Effort normal and breath sounds normal. No respiratory distress.  Abdominal: Soft. Bowel sounds are normal.  Musculoskeletal: She exhibits no edema or tenderness.  Neurological: She is alert.  Skin: Skin is warm and dry. She is not diaphoretic.  Nursing note and vitals reviewed.   ED Course  Procedures (including critical care time) Labs Review Labs Reviewed - No data to display  Imaging Review No results found. I have personally reviewed and evaluated these images and lab results as part of my medical decision-making.   EKG Interpretation None      MDM   Final diagnoses:  Angioedema, initial encounter    Jennifer Diaz presents with angioedema without airway compromise.   Findings and plan of care discussed with Jennifer HongBrian Miller, MD.  Will observe pt after given medications, if she improves will discharge pt with instructions to follow up with her allergist, if it does not improve, will admit pt.  9:06 PM Pt reevaluation. Pt swelling has decreased significantly. Pt can now open her mouth fully and stick her tongue out. Pt talking is much closer to normal, verified by pt son at bedside. Pt to be discharged with prednisone and pepcid for the next five days.   Jennifer PancoastShawn C Printice Hellmer, PA-C 01/30/15 2124  Jennifer HongBrian Miller, MD 01/31/15 [phone]

## 2015-01-30 NOTE — ED Provider Notes (Signed)
The pt has had acute onset of swelling of the bilateral cheeks - no tongue swelling on my exam - normal phonation - has some mild submandibular swelling and no wheezing - has had this X 2 this year - all episodes came after starting HCTZ.  No other allergic sx, no itching - she has seen initial allergist eval without definitive etiology.  No airway compromise - solumedrol, pepcid, benadryl and short observatory period.  Medical screening examination/treatment/procedure(s) were conducted as a shared visit with non-physician practitioner(s) and myself.  I personally evaluated the patient during the encounter.  Clinical Impression:   Final diagnoses:  Angioedema, initial encounter         Eber HongBrian Jakera Beaupre, MD 01/31/15 1156

## 2015-01-30 NOTE — ED Notes (Signed)
Pt ate shrimp and calamari yesterday and today pt states she started swelling in her mouth, throat, and face around 12:30pm. Pt states it hurts to swallow.

## 2015-02-05 ENCOUNTER — Other Ambulatory Visit: Payer: Self-pay | Admitting: Family Medicine

## 2015-02-05 NOTE — Telephone Encounter (Signed)
Medication refilled per protocol. 

## 2015-02-06 ENCOUNTER — Emergency Department (HOSPITAL_COMMUNITY)
Admission: EM | Admit: 2015-02-06 | Discharge: 2015-02-06 | Disposition: A | Discharge status: Home / Self Care | Payer: BLUE CROSS/BLUE SHIELD | Attending: Emergency Medicine | Admitting: Emergency Medicine

## 2015-02-06 ENCOUNTER — Encounter (HOSPITAL_COMMUNITY): Payer: Self-pay | Admitting: *Deleted

## 2015-02-06 ENCOUNTER — Emergency Department (HOSPITAL_COMMUNITY): Payer: BLUE CROSS/BLUE SHIELD

## 2015-02-06 DIAGNOSIS — I10 Essential (primary) hypertension: Secondary | ICD-10-CM | POA: Diagnosis not present

## 2015-02-06 DIAGNOSIS — S8262XA Displaced fracture of lateral malleolus of left fibula, initial encounter for closed fracture: Secondary | ICD-10-CM | POA: Insufficient documentation

## 2015-02-06 DIAGNOSIS — S93402A Sprain of unspecified ligament of left ankle, initial encounter: Secondary | ICD-10-CM | POA: Insufficient documentation

## 2015-02-06 DIAGNOSIS — Y9389 Activity, other specified: Secondary | ICD-10-CM | POA: Insufficient documentation

## 2015-02-06 DIAGNOSIS — Z8669 Personal history of other diseases of the nervous system and sense organs: Secondary | ICD-10-CM | POA: Insufficient documentation

## 2015-02-06 DIAGNOSIS — Y998 Other external cause status: Secondary | ICD-10-CM | POA: Diagnosis not present

## 2015-02-06 DIAGNOSIS — W1839XA Other fall on same level, initial encounter: Secondary | ICD-10-CM | POA: Insufficient documentation

## 2015-02-06 DIAGNOSIS — Z79899 Other long term (current) drug therapy: Secondary | ICD-10-CM | POA: Diagnosis not present

## 2015-02-06 DIAGNOSIS — Z8639 Personal history of other endocrine, nutritional and metabolic disease: Secondary | ICD-10-CM | POA: Diagnosis not present

## 2015-02-06 DIAGNOSIS — S80812A Abrasion, left lower leg, initial encounter: Secondary | ICD-10-CM | POA: Diagnosis not present

## 2015-02-06 DIAGNOSIS — Y92219 Unspecified school as the place of occurrence of the external cause: Secondary | ICD-10-CM | POA: Diagnosis not present

## 2015-02-06 DIAGNOSIS — S99912A Unspecified injury of left ankle, initial encounter: Secondary | ICD-10-CM | POA: Diagnosis present

## 2015-02-06 MED ORDER — HYDROCODONE-ACETAMINOPHEN 5-325 MG PO TABS: 2.0000 | ORAL_TABLET | ORAL | Status: DC | PRN

## 2015-02-06 MED ORDER — HYDROCODONE-ACETAMINOPHEN 5-325 MG PO TABS
1.0000 | ORAL_TABLET | Freq: Once | ORAL | Status: AC
  Administered 2015-02-06: 1 via ORAL
  Filled 2015-02-06: qty 1

## 2015-02-06 MED ORDER — HYDROCODONE-ACETAMINOPHEN 5-325 MG PO TABS: 1.0000 | ORAL_TABLET | ORAL | Status: DC | PRN

## 2015-02-06 MED ORDER — TETANUS-DIPHTH-ACELL PERTUSSIS 5-2.5-18.5 LF-MCG/0.5 IM SUSP
0.5000 mL | Freq: Once | INTRAMUSCULAR | Status: AC
  Administered 2015-02-06: 0.5 mL via INTRAMUSCULAR
  Filled 2015-02-06: qty 0.5

## 2015-02-06 NOTE — ED Provider Notes (Signed)
CSN: [id number]     Arrival date & time 02/06/15  1818 History   First MD Initiated Contact with Patient 02/06/15 1837     Chief Complaint  Patient presents with  . Ankle Pain     (Consider location/radiation/quality/duration/timing/severity/associated sxs/prior Treatment) HPI   Jennifer Diaz is a [age] y.o. female here for evaluation of left leg injury when she fell going down some bleachers at school. She complains of pain in the left ankle and has pain, which prevents her from walking. She denies headache, neck pain or back pain. She was in the ED about a week ago with angioedema. She has not take ace inhibitors or ARBs. She was treated with prednisone, and antihistamine. There are no other known modifying factors.   Past Medical History  Diagnosis Date  . Allergy   . Hypertension   . Hyperlipidemia   . Migraine   . Sleep apnea    Past Surgical History  Procedure Laterality Date  . Essure  April 2013    GYN- Dr. Algie CofferFogelman   Family History  Problem Relation Age of Onset  . Diabetes Mother   . Hypertension Father   . Diabetes Brother    Social History  Substance Use Topics  . Smoking status: Never Smoker   . Smokeless tobacco: Never Used  . Alcohol Use: Yes     Comment: 4-6 drinks per month   OB History    No data available     Review of Systems  All other systems reviewed and are negative.     Allergies  Aleve; Lisinopril; and Norvasc  Home Medications   Prior to Admission medications   Medication Sig Start Date End Date Taking? Authorizing Provider  albuterol (PROVENTIL HFA;VENTOLIN HFA) 108 (90 BASE) MCG/ACT inhaler Inhale 2 puffs into the lungs every 6 (six) hours as needed for wheezing or shortness of breath. 01/13/15  Yes Salley ScarletKawanta F [place], MD  hydrochlorothiazide (HYDRODIURIL) 25 MG tablet TAKE 1 TABLET BY MOUTH EVERY DAY 01/08/15  Yes Salley ScarletKawanta F [place], MD  metoprolol succinate (TOPROL-XL) 100 MG 24 hr tablet TAKE 1 TABLET BY MOUTH DAILY EITHER WITH OR  IMMEDIATELY FOLLOWING A MEAL 02/05/15  Yes Salley ScarletKawanta F [place], MD  azithromycin (ZITHROMAX) 250 MG tablet Take (2) tablets by mouth on day 1, then take (1) tablet by mouth on days 2-5. Patient not taking: Reported on 02/06/2015 01/13/15   Salley ScarletKawanta F [place], MD  cetirizine (ZYRTEC) 10 MG tablet Take 1 tablet (10 mg total) by mouth daily. Patient not taking: Reported on 02/06/2015 01/08/15   Salley ScarletKawanta F [place], MD  famotidine (PEPCID) 40 MG tablet Take 1 tablet (40 mg total) by mouth daily. Patient not taking: Reported on 02/06/2015 01/30/15   Hillard DankerShawn C Joy, PA-C  HYDROcodone-acetaminophen (NORCO) 5-325 MG tablet Take 1-2 tablets by mouth every 4 (four) hours as needed. 02/06/15   Mancel BaleElliott Hamad Whyte, MD  HYDROcodone-acetaminophen (NORCO/VICODIN) 5-325 MG tablet Take 2 tablets by mouth every 4 (four) hours as needed. 02/06/15   Mancel BaleElliott Taralyn Ferraiolo, MD  predniSONE (DELTASONE) 10 MG tablet Take 4 tablets (40mg ) on days 1-2. Take 2 tablets (20mg ) on days 3-4. Take 1 tablet (10mg ) on days 5-6, then stop. Patient not taking: Reported on 02/06/2015 01/13/15   Salley ScarletKawanta F [place], MD  predniSONE (DELTASONE) 10 MG tablet Take 4 tablets (40 mg total) by mouth daily. Patient not taking: Reported on 02/06/2015 01/30/15   Shawn C Joy, PA-C   BP 199/125 mmHg  Pulse 101  Temp(Src) 97.6 F (  36.4 C) (Oral)  Resp 16  Ht  (1.549 m)  Wt 144 lb (65.318 kg)  BMI 27.22 kg/m2  SpO2 100%  LMP 01/24/2015 Physical Exam  Constitutional: She is oriented to person, place, and time. She appears well-developed and well-nourished. She appears distressed (she is uncomfortable).  HENT:  Head: Normocephalic and atraumatic.  Right Ear: External ear normal.  Left Ear: External ear normal.  Eyes: Conjunctivae and EOM are normal. Pupils are equal, round, and reactive to light.  Neck: Normal range of motion and phonation normal. Neck supple.  Cardiovascular: Normal rate.   Hypertensive  Pulmonary/Chest: Effort normal. She exhibits no bony  tenderness.  Musculoskeletal:  Left leg- abrasion beneath knee, normal range of motion. Left knee, no tibial tenderness, left lateral ankle tenderness and swelling. Left foot is normal. Normal sensation, circulation in the toes of the left foot.  Neurological: She is alert and oriented to person, place, and time. No cranial nerve deficit or sensory deficit. She exhibits normal muscle tone. Coordination normal.  Skin: Skin is warm, dry and intact.  Psychiatric: She has a normal mood and affect. Her behavior is normal. Judgment and thought content normal.  Nursing note and vitals reviewed.   ED Course  Procedures (including critical care time) Labs Review Labs Reviewed - No data to display  Imaging Review Dg Ankle Complete Left  02/06/2015  CLINICAL DATA:  Pain following fall with twisting injury EXAM: LEFT ANKLE COMPLETE - 3+ VIEW COMPARISON:  None. FINDINGS: Frontal, oblique, and lateral views obtained. There is marked swelling laterally. There is avulsion in the lateral malleolar region. No other fractures are apparent. The ankle mortise appears grossly intact. There is no appreciable joint effusion. There is a bone island in the superior posterior calcaneus. IMPRESSION: Avulsion lateral malleolus with marked swelling laterally. Ankle mortise appears grossly intact. Electronically Signed   By: Bretta Bang III M.D.   On: 02/06/2015 18:48   I have personally reviewed and evaluated these images and lab results as part of my medical decision-making.   EKG Interpretation None      MDM   Final diagnoses:  Ankle sprain, left, initial encounter  Avulsion fracture of lateral malleolus of left fibula, closed, initial encounter  Essential hypertension    Left ankle avulsion fracture without evidence for instability or serious injury.  Nursing Notes Reviewed/ Care Coordinated Applicable Imaging Reviewed Interpretation of Laboratory Data incorporated into ED treatment  The patient  appears reasonably screened and/or stabilized for discharge and I doubt any other medical condition or other [facility] requiring further screening, evaluation, or treatment in the ED at this time prior to discharge.  Plan: Home Medications- Norco; Home Treatments- ASO splint; return here if the recommended treatment, does not improve the symptoms; Recommended follow up- PCP 3 days for BP check     Mancel Bale, MD 02/07/15 [phone]

## 2015-02-06 NOTE — ED Notes (Signed)
Discharge instructions given, pt demonstrated teach back and verbal understanding. No concerns voiced.  

## 2015-02-06 NOTE — ED Notes (Signed)
Pt states she fell on bleachers injuring her left ankle. Pts ankle is swollen, popliteal pulse palpable. NAD noted

## 2015-02-06 NOTE — Discharge Instructions (Signed)
There weight on the left foot as tolerated. Use your crutches if it hurts to walk. Elevate left foot above your heart as much as possible and use ice on it several times a day for 3 days.   Ankle Sprain An ankle sprain is an injury to the strong, fibrous tissues (ligaments) that hold the bones of your ankle joint together.  CAUSES An ankle sprain is usually caused by a fall or by twisting your ankle. Ankle sprains most commonly occur when you step on the outer edge of your foot, and your ankle turns inward. People who participate in sports are more prone to these types of injuries.  SYMPTOMS   Pain in your ankle. The pain may be present at rest or only when you are trying to stand or walk.  Swelling.  Bruising. Bruising may develop immediately or within 1 to 2 days after your injury.  Difficulty standing or walking, particularly when turning corners or changing directions. DIAGNOSIS  Your caregiver will ask you details about your injury and perform a physical exam of your ankle to determine if you have an ankle sprain. During the physical exam, your caregiver will press on and apply pressure to specific areas of your foot and ankle. Your caregiver will try to move your ankle in certain ways. An X-ray exam may be done to be sure a bone was not broken or a ligament did not separate from one of the bones in your ankle (avulsion fracture).  TREATMENT  Certain types of braces can help stabilize your ankle. Your caregiver can make a recommendation for this. Your caregiver may recommend the use of medicine for pain. If your sprain is severe, your caregiver may refer you to a surgeon who helps to restore function to parts of your skeletal system (orthopedist) or a physical therapist. HOME CARE INSTRUCTIONS   Apply ice to your injury for 1-2 days or as directed by your caregiver. Applying ice helps to reduce inflammation and pain.  Put ice in a plastic bag.  Place a towel between your skin and the  bag.  Leave the ice on for 15-20 minutes at a time, every 2 hours while you are awake.  Only take over-the-counter or prescription medicines for pain, discomfort, or fever as directed by your caregiver.  Elevate your injured ankle above the level of your heart as much as possible for 2-3 days.  If your caregiver recommends crutches, use them as instructed. Gradually put weight on the affected ankle. Continue to use crutches or a cane until you can walk without feeling pain in your ankle.  If you have a plaster splint, wear the splint as directed by your caregiver. Do not rest it on anything harder than a pillow for the first 24 hours. Do not put weight on it. Do not get it wet. You may take it off to take a shower or bath.  You may have been given an elastic bandage to wear around your ankle to provide support. If the elastic bandage is too tight (you have numbness or tingling in your foot or your foot becomes cold and blue), adjust the bandage to make it comfortable.  If you have an air splint, you may blow more air into it or let air out to make it more comfortable. You may take your splint off at night and before taking a shower or bath. Wiggle your toes in the splint several times per day to decrease swelling. SEEK MEDICAL CARE IF:  You have rapidly increasing bruising or swelling.  Your toes feel extremely cold or you lose feeling in your foot.  Your pain is not relieved with medicine. SEEK IMMEDIATE MEDICAL CARE IF:  Your toes are numb or blue.  You have severe pain that is increasing. MAKE SURE YOU:   Understand these instructions.  Will watch your condition.  Will get help right away if you are not doing well or get worse.   This information is not intended to replace advice given to you by your health care provider. Make sure you discuss any questions you have with your health care provider.   Document Released: 03/27/2005 Document Revised: 04/17/2014 Document Reviewed:  04/08/2011 Elsevier Interactive Patient Education 2016 Elsevier Inc.  Ankle Fracture A fracture is a break in a bone. A cast or splint may be used to protect the ankle and heal the break. Sometimes, surgery is needed. HOME CARE  Use crutches as told by your doctor. It is very important that you use your crutches correctly.  Do not put weight or pressure on the injured ankle until told by your doctor.  Keep your ankle raised (elevated) when sitting or lying down.  Apply ice to the ankle:  Put ice in a plastic bag.  Place a towel between your cast and the bag.  Leave the ice on for 20 minutes, 2-3 times a day.  If you have a plaster or fiberglass cast:  Do not try to scratch under the cast with any objects.  Check the skin around the cast every day. You may put lotion on red or sore areas.  Keep your cast dry and clean.  If you have a plaster splint:  Wear the splint as told by your doctor.  You can loosen the elastic around the splint if your toes get numb, tingle, or turn cold or blue.  Do not put pressure on any part of your cast or splint. It may break. Rest your plaster splint or cast only on a pillow the first 24 hours until it is fully hardened.  Cover your cast or splint with a plastic bag during showers.  Do not lower your cast or splint into water.  Take medicine as told by your doctor.  Do not drive until your doctor says it is safe.  Follow-up with your doctor as told. It is very important that you go to your follow-up visits. GET HELP IF: The swelling and discomfort gets worse.  GET HELP RIGHT AWAY IF:   Your splint or cast breaks.  You continue to have very bad pain.  You have new pain or swelling after your splint or cast was put on.  Your skin or toes below the injured ankle:  Turn blue or gray.  Feel cold, numb, or you cannot feel them.  There is a bad smell or yellowish white fluid (pus) coming from under the splint or cast. MAKE SURE  YOU:   Understand these instructions.  Will watch your condition.  Will get help right away if you are not doing well or get worse.   This information is not intended to replace advice given to you by your health care provider. Make sure you discuss any questions you have with your health care provider.   Document Released: 01/22/2009 Document Revised: 01/15/2013 Document Reviewed: 10/24/2012 Elsevier Interactive Patient Education [employer]

## 2015-02-07 ENCOUNTER — Encounter (HOSPITAL_COMMUNITY): Payer: Self-pay | Admitting: *Deleted

## 2015-02-07 ENCOUNTER — Emergency Department (HOSPITAL_COMMUNITY)
Admission: EM | Admit: 2015-02-07 | Discharge: 2015-02-07 | Disposition: A | Discharge status: Home / Self Care | Payer: BLUE CROSS/BLUE SHIELD | Attending: Emergency Medicine | Admitting: Emergency Medicine

## 2015-02-07 DIAGNOSIS — Z8679 Personal history of other diseases of the circulatory system: Secondary | ICD-10-CM | POA: Insufficient documentation

## 2015-02-07 DIAGNOSIS — E876 Hypokalemia: Secondary | ICD-10-CM

## 2015-02-07 DIAGNOSIS — M25572 Pain in left ankle and joints of left foot: Secondary | ICD-10-CM | POA: Insufficient documentation

## 2015-02-07 DIAGNOSIS — R42 Dizziness and giddiness: Secondary | ICD-10-CM | POA: Diagnosis present

## 2015-02-07 DIAGNOSIS — Z8669 Personal history of other diseases of the nervous system and sense organs: Secondary | ICD-10-CM | POA: Diagnosis not present

## 2015-02-07 DIAGNOSIS — I10 Essential (primary) hypertension: Secondary | ICD-10-CM | POA: Diagnosis not present

## 2015-02-07 DIAGNOSIS — Z79899 Other long term (current) drug therapy: Secondary | ICD-10-CM | POA: Insufficient documentation

## 2015-02-07 DIAGNOSIS — R51 Headache: Secondary | ICD-10-CM | POA: Insufficient documentation

## 2015-02-07 DIAGNOSIS — R55 Syncope and collapse: Secondary | ICD-10-CM | POA: Diagnosis not present

## 2015-02-07 LAB — URINALYSIS, ROUTINE W REFLEX MICROSCOPIC
Bilirubin Urine: NEGATIVE
Glucose, UA: NEGATIVE mg/dL
HGB URINE DIPSTICK: NEGATIVE
Ketones, ur: NEGATIVE mg/dL
Nitrite: NEGATIVE
PH: 7.5 (ref 5.0–8.0)
Protein, ur: NEGATIVE mg/dL
SPECIFIC GRAVITY, URINE: 1.012 (ref 1.005–1.030)
UROBILINOGEN UA: 0.2 mg/dL (ref 0.0–1.0)

## 2015-02-07 LAB — BASIC METABOLIC PANEL
ANION GAP: 12 (ref 5–15)
BUN: 10 mg/dL (ref 6–20)
CHLORIDE: 91 mmol/L — AB (ref 101–111)
CO2: 28 mmol/L (ref 22–32)
Calcium: 9.1 mg/dL (ref 8.9–10.3)
Creatinine, Ser: 0.78 mg/dL (ref 0.44–1.00)
GFR calc Af Amer: >60 mL/min (ref >60)
GLUCOSE: 110 mg/dL — AB (ref 65–99)
POTASSIUM: 2.8 mmol/L — AB (ref 3.5–5.1)
Sodium: 131 mmol/L — ABNORMAL LOW (ref 135–145)

## 2015-02-07 LAB — CBC
HEMATOCRIT: 43.3 % (ref 36.0–46.0)
HEMOGLOBIN: 15 g/dL (ref 12.0–15.0)
MCH: 28.8 pg (ref 26.0–34.0)
MCHC: 34.6 g/dL (ref 30.0–36.0)
MCV: 83.1 fL (ref 78.0–100.0)
Platelets: 330 10*3/uL (ref 150–400)
RBC: 5.21 MIL/uL — ABNORMAL HIGH (ref 3.87–5.11)
RDW: 13.6 % (ref 11.5–15.5)
WBC: 15 10*3/uL — ABNORMAL HIGH (ref 4.0–10.5)

## 2015-02-07 LAB — URINE MICROSCOPIC-ADD ON

## 2015-02-07 LAB — I-STAT TROPONIN, ED: TROPONIN I, POC: 0.02 ng/mL (ref 0.00–0.08)

## 2015-02-07 MED ORDER — ACETAMINOPHEN 325 MG PO TABS
650.0000 mg | ORAL_TABLET | Freq: Once | ORAL | Status: AC
Start: 2015-02-07 — End: 2015-02-07
  Administered 2015-02-07: 650 mg via ORAL
  Filled 2015-02-07: qty 2

## 2015-02-07 MED ORDER — HYDRALAZINE HCL 10 MG PO TABS: 10.0000 mg | ORAL_TABLET | Freq: Three times a day (TID) | ORAL | Status: DC

## 2015-02-07 MED ORDER — HYDRALAZINE HCL 10 MG PO TABS
10.0000 mg | ORAL_TABLET | Freq: Once | ORAL | Status: AC
  Administered 2015-02-07: 10 mg via ORAL
  Filled 2015-02-07: qty 1

## 2015-02-07 MED ORDER — POTASSIUM CHLORIDE CRYS ER 20 MEQ PO TBCR
40.0000 meq | EXTENDED_RELEASE_TABLET | Freq: Once | ORAL | Status: AC
  Administered 2015-02-07: 40 meq via ORAL
  Filled 2015-02-07: qty 2

## 2015-02-07 NOTE — ED Notes (Signed)
September 30. 132/66 pulse 78, [facility] [facility]

## 2015-02-07 NOTE — ED Notes (Signed)
Pt. Taken to the bathroom via wheelchair due to fx of lt. Ankle.  Pt. Did well with assistance.  Pt. Is unable to put weight on her lt. Ankle which is in a walking boot

## 2015-02-07 NOTE — ED Notes (Signed)
Pt. Given a bagged lunch and water to drink

## 2015-02-07 NOTE — ED Provider Notes (Signed)
CSN: [id number]     Arrival date & time 02/07/15  1227 History   First MD Initiated Contact with Patient 02/07/15 1309     Chief Complaint  Patient presents with  . Dizziness  . Hypertension   Jennifer Diaz is a 44 y.o. female with a history of hypertension and hyperlipidemia who presents to the emergency department complaining of a near syncopal episode today. The patient reports that she had a ankle fracture yesterday and was at the orthopedic doctor's office today getting boot placed on her ankle. She reports while she was on the exam table and there were placing the boot she began feeling lightheaded, hot and sweaty and felt like she might pass out. She reports she had lots of pain in her ankle when they were placing the boot on. She presents also had numbness and tingling in her bilateral hands. She reports all of her symptoms are now resolved other than her ankle pain. She reports she has 10 out of 10 left ankle pain currently and is taking hydrocodone at 7 AM this morning for treatment. She denies any chest pain, shortness of breath or palpitations during her episode today. She reports she's been having problems with high blood pressure for a long time and that her pressure is been more elevated recently. Her blood pressure was 109/125 yesterday and is chronically very elevated. She reports that she has been taking her blood pressure medicines. She complains of a mild 2 out of 10 headache currently. Her primary care doctors at [facility]. The patient denies fevers, chills, chest pain, shortness of breath, palpitations, cough, wheezing, vision changes, abdominal pain, vomiting, fall, syncope, trauma, numbness, tingling or weakness.  (Consider location/radiation/quality/duration/timing/severity/associated sxs/prior Treatment) HPI  Past Medical History  Diagnosis Date  . Allergy   . Hypertension   . Hyperlipidemia   . Migraine   . Sleep apnea    Past Surgical History   Procedure Laterality Date  . Essure  April 2013    GYN- Dr. Algie Coffer   Family History  Problem Relation Age of Onset  . Diabetes Mother   . Hypertension Father   . Diabetes Brother    Social History  Substance Use Topics  . Smoking status: Never Smoker   . Smokeless tobacco: Never Used  . Alcohol Use: Yes     Comment: 4-6 drinks per month   OB History    No data available     Review of Systems  Constitutional: Negative for fever and chills.  HENT: Negative for congestion and sore throat.   Eyes: Negative for photophobia and visual disturbance.  Respiratory: Negative for cough, shortness of breath and wheezing.   Cardiovascular: Negative for chest pain, palpitations and leg swelling.  Gastrointestinal: Positive for nausea (resolved. ). Negative for vomiting, abdominal pain and diarrhea.  Genitourinary: Negative for dysuria and difficulty urinating.  Musculoskeletal: Positive for arthralgias. Negative for back pain and neck pain.  Skin: Negative for rash.  Neurological: Positive for light-headedness (resolved. ) and headaches. Negative for dizziness, syncope, weakness and numbness.      Allergies  Aleve; Lisinopril; and Norvasc  Home Medications   Prior to Admission medications   Medication Sig Start Date End Date Taking? Authorizing Provider  albuterol (PROVENTIL HFA;VENTOLIN HFA) 108 (90 BASE) MCG/ACT inhaler Inhale 2 puffs into the lungs every 6 (six) hours as needed for wheezing or shortness of breath. 01/13/15   Salley Scarlet, MD  azithromycin (ZITHROMAX) 250 MG tablet Take (2) tablets by  mouth on day 1, then take (1) tablet by mouth on days 2-5. Patient not taking: Reported on 02/06/2015 01/13/15   Salley Scarlet, MD  cetirizine (ZYRTEC) 10 MG tablet Take 1 tablet (10 mg total) by mouth daily. Patient not taking: Reported on 02/06/2015 01/08/15   Salley Scarlet, MD  famotidine (PEPCID) 40 MG tablet Take 1 tablet (40 mg total) by mouth daily. Patient not  taking: Reported on 02/06/2015 01/30/15   Shawn C Joy, PA-C  hydrALAZINE (APRESOLINE) 10 MG tablet Take 1 tablet (10 mg total) by mouth 3 (three) times daily. 02/07/15   Everlene Farrier, PA-C  hydrochlorothiazide (HYDRODIURIL) 25 MG tablet TAKE 1 TABLET BY MOUTH EVERY DAY 01/08/15   Salley Scarlet, MD  HYDROcodone-acetaminophen [facility]) 5-325 MG tablet Take 1-2 tablets by mouth every 4 (four) hours as needed. 02/06/15   Mancel Bale, MD  HYDROcodone-acetaminophen (NORCO/VICODIN) 5-325 MG tablet Take 2 tablets by mouth every 4 (four) hours as needed. 02/06/15   Mancel Bale, MD  metoprolol succinate (TOPROL-XL) 100 MG 24 hr tablet TAKE 1 TABLET BY MOUTH DAILY EITHER WITH OR IMMEDIATELY FOLLOWING A MEAL 02/05/15   Salley Scarlet, MD  predniSONE (DELTASONE) 10 MG tablet Take 4 tablets (40mg ) on days 1-2. Take 2 tablets (20mg ) on days 3-4. Take 1 tablet (10mg ) on days 5-6, then stop. Patient not taking: Reported on 02/06/2015 01/13/15   Salley Scarlet, MD  predniSONE (DELTASONE) 10 MG tablet Take 4 tablets (40 mg total) by mouth daily. Patient not taking: Reported on 02/06/2015 01/30/15   Shawn C Joy, PA-C   BP 178/103 mmHg  Pulse 68  Temp(Src) 98.4 F (36.9 C) (Oral)  Resp 17  Ht 5\' 1"  (1.549 m)  Wt 160 lb (72.576 kg)  BMI 30.25 kg/m2  SpO2 97%  LMP 01/24/2015 Physical Exam  Constitutional: She is oriented to person, place, and time. She appears well-developed and well-nourished. No distress.  Nontoxic appearing.  HENT:  Head: Normocephalic and atraumatic.  Right Ear: External ear normal.  Left Ear: External ear normal.  Mouth/Throat: Oropharynx is clear and moist.  Eyes: Conjunctivae and EOM are normal. Pupils are equal, round, and reactive to light. Right eye exhibits no discharge. Left eye exhibits no discharge.  Neck: Normal range of motion. Neck supple. No JVD present. No tracheal deviation present.  Cardiovascular: Normal rate, regular rhythm, normal heart sounds and intact  distal pulses.  Exam reveals no gallop and no friction rub.   No murmur heard. Pulmonary/Chest: Effort normal and breath sounds normal. No respiratory distress. She has no wheezes. She has no rales. She exhibits no tenderness.  Lungs are clear to auscultation bilaterally.  Abdominal: Soft. Bowel sounds are normal. She exhibits no distension. There is no tenderness. There is no guarding.  Musculoskeletal: Normal range of motion. She exhibits no edema.  No lower extremity edema or tenderness. Patient is spontaneously moving all extremities in a coordinated fashion exhibiting good strength.   Lymphadenopathy:    She has no cervical adenopathy.  Neurological: She is alert and oriented to person, place, and time. No cranial nerve deficit. Coordination normal.  The patient is alert and oriented 3. Sensation intact to bilateral upper and lower extremities. Cranial nerves are intact bilaterally. Vision is grossly intact. EOMs are intact bilaterally. No pronator drift. Finger-to-nose intact bilaterally.   Skin: Skin is warm and dry. No rash noted. She is not diaphoretic. No erythema. No pallor.  Psychiatric: She has a normal mood and affect. Her behavior  is normal.  Nursing note and vitals reviewed.   ED Course  Procedures (including critical care time) Labs Review Labs Reviewed  BASIC METABOLIC PANEL - Abnormal; Notable for the following:    Sodium 131 (*)    Potassium 2.8 (*)    Chloride 91 (*)    Glucose, Bld 110 (*)    All other components within normal limits  CBC - Abnormal; Notable for the following:    WBC 15.0 (*)    RBC 5.21 (*)    All other components within normal limits  URINALYSIS, ROUTINE W REFLEX MICROSCOPIC (NOT AT [facility]) - Abnormal; Notable for the following:    Leukocytes, UA MODERATE (*)    All other components within normal limits  URINE CULTURE  URINE MICROSCOPIC-ADD ON  CBG MONITORING, ED  I-STAT TROPOININ, ED    Imaging Review Dg Ankle Complete  Left  02/06/2015  CLINICAL DATA:  Pain following fall with twisting injury EXAM: LEFT ANKLE COMPLETE - 3+ VIEW COMPARISON:  None. FINDINGS: Frontal, oblique, and lateral views obtained. There is marked swelling laterally. There is avulsion in the lateral malleolar region. No other fractures are apparent. The ankle mortise appears grossly intact. There is no appreciable joint effusion. There is a bone island in the superior posterior calcaneus. IMPRESSION: Avulsion lateral malleolus with marked swelling laterally. Ankle mortise appears grossly intact. Electronically Signed   By: Bretta Bang III M.D.   On: 02/06/2015 18:48   I have personally reviewed and evaluated these lab results as part of my medical decision-making.   EKG Interpretation   Date/Time:  Sunday February 07 2015 13:10:22 EDT Ventricular Rate:  65 PR Interval:  144 QRS Duration: 86 QT Interval:  424 QTC Calculation: 440 R Axis:   24 Text Interpretation:  Normal sinus rhythm ST \\T \ T wave abnormality,  consider lateral ischemia Abnormal ECG Lateral ischemic changes new  Confirmed by Gwendolyn Grant  MD, BLAIR ([id number]) on 02/07/2015 1:10:23 PM      Filed Vitals:   02/07/15 1430 02/07/15 1500 02/07/15 1530 02/07/15 1600  BP: 194/106 192/104 178/103   Pulse: 59 57 55 68  Temp:      TempSrc:      Resp: Height:      Weight:      SpO2: 97% 99% 98% 97%     MDM   Meds given in ED:  Medications  acetaminophen (TYLENOL) tablet 650 mg (650 mg Oral Given 02/07/15 1337)  potassium chloride SA (K-DUR,KLOR-CON) CR tablet 40 mEq (40 mEq Oral Given 02/07/15 1541)  hydrALAZINE (APRESOLINE) tablet 10 mg (10 mg Oral Given 02/07/15 1541)    New Prescriptions   HYDRALAZINE (APRESOLINE) 10 MG TABLET    Take 1 tablet (10 mg total) by mouth 3 (three) times daily.    Final diagnoses:  Near syncope  Essential hypertension  Hypokalemia   This is a 44 y.o. female with a history of hypertension and hyperlipidemia who  presents to the emergency department complaining of a near syncopal episode today. The patient reports that she had a ankle fracture yesterday and was at the orthopedic doctor's office today getting boot placed on her ankle. She reports while she was on the exam table and there were placing the boot she began feeling lightheaded, hot and sweaty and felt like she might pass out. She reports she had lots of pain in her ankle when they were placing the boot on. She presents also had numbness and tingling in her  bilateral hands. She reports all of her symptoms are now resolved other than her ankle pain.  On exam the patient is afebrile and nontoxic appearing. She has no focal neurological deficits. She denies chest pain or shortness of breath. EKG shows some lateral ischemic changes which are new from her previous tracing. Will obtain blood work and orthostatic vital signs. Patient is hypertensive with a pressure of 221/113. Patient has chronically elevated blood pressures. She was seen in the emergency department yesterday with a pressure of 199/125. Urinalysis indicates moderate leukocytes and is nitrite negative. Patient denies urinary symptoms at this time. Will obtain urine culture. BMP indicated potassium of 2.8. Will replenish potassium orally with 40 mEq of potassium in the emergency department. CBC is a marker for white count of 15,000. Troponin is 0.02. I advised patient of these findings. Will provide with potassium and Temodar hydralazine in the emergency department. Patient tolerated both medications well and her pressure prior to discharge is 170/103. She denies feeling lightheaded or dizzy after these medications. Patient reports she is taking her other blood pressure medicines. Will start on hydralazine 10 mg 3 times a day until she can follow-up with her primary care provider. I encouraged close follow-up with primary care this week. I called to consult with her PCP but did not receive a call back at  this time. Patient reports she is agreeable to discharge. I advised the patient to follow-up with their primary care provider this week. I advised the patient to return to the emergency department with new or worsening symptoms or new concerns. The patient verbalized understanding and agreement with plan.    This patient was discussed with and evaluated by Dr. Gwendolyn GrantWalden who agrees with assessment and plan.    Everlene FarrierWilliam Aerilynn Goin, PA-C 02/07/15 1634  Elwin MochaBlair Walden, MD 02/08/15 [phone]

## 2015-02-07 NOTE — ED Notes (Signed)
On June 30th of this year pt. Reports that her BP was 138/90 , 76 pulse.  [facility]

## 2015-02-07 NOTE — ED Notes (Signed)
Pt stable, ambulatory, states understanding of discharge instructions 

## 2015-02-07 NOTE — ED Notes (Signed)
Pt is here with concerns of high blood pressure.  Pt was seen at orthopedic urgent care today and is here with near-syncope, dizziness spell today. Pt is on blood pressure medicine.  She just fractured her ankle yesterday.  Pt sent here after episode today.

## 2015-02-07 NOTE — Discharge Instructions (Signed)
Near-Syncope Near-syncope (commonly known as near fainting) is sudden weakness, dizziness, or feeling like you might pass out. During an episode of near-syncope, you may also develop pale skin, have tunnel vision, or feel sick to your stomach (nauseous). Near-syncope may occur when getting up after sitting or while standing for a long time. It is caused by a sudden decrease in blood flow to the brain. This decrease can result from various causes or triggers, most of which are not serious. However, because near-syncope can sometimes be a sign of something serious, a medical evaluation is required. The specific cause is often not determined. HOME CARE INSTRUCTIONS  Monitor your condition for any changes. The following actions may help to alleviate any discomfort you are experiencing:  Have someone stay with you until you feel stable.  Lie down right away and prop your feet up if you start feeling like you might faint. Breathe deeply and steadily. Wait until all the symptoms have passed. Most of these episodes last only a few minutes. You may feel tired for several hours.   Drink enough fluids to keep your urine clear or pale yellow.   If you are taking blood pressure or heart medicine, get up slowly when seated or lying down. Take several minutes to sit and then stand. This can reduce dizziness.  Follow up with your health care provider as directed. SEEK IMMEDIATE MEDICAL CARE IF:   You have a severe headache.   You have unusual pain in the chest, abdomen, or back.   You are bleeding from the mouth or rectum, or you have black or tarry stool.   You have an irregular or very fast heartbeat.   You have repeated fainting or have seizure-like jerking during an episode.   You faint when sitting or lying down.   You have confusion.   You have difficulty walking.   You have severe weakness.   You have vision problems.  MAKE SURE YOU:   Understand these instructions.  Will  watch your condition.  Will get help right away if you are not doing well or get worse.   This information is not intended to replace advice given to you by your health care provider. Make sure you discuss any questions you have with your health care provider.   Document Released: 03/27/2005 Document Revised: 04/01/2013 Document Reviewed: 08/30/2012 Elsevier Interactive Patient Education 2016 [employer] DASH stands for "Dietary Approaches to Stop Hypertension." The DASH eating plan is a healthy eating plan that has been shown to reduce high blood pressure (hypertension). Additional health benefits may include reducing the risk of type 2 diabetes mellitus, heart disease, and stroke. The DASH eating plan may also help with weight loss. WHAT DO I NEED TO KNOW ABOUT THE DASH EATING PLAN? For the DASH eating plan, you will follow these general guidelines:  Choose foods with a percent daily value for sodium of less than 5% (as listed on the food label).  Use salt-free seasonings or herbs instead of table salt or sea salt.  Check with your health care provider or pharmacist before using salt substitutes.  Eat lower-sodium products, often labeled as "lower sodium" or "no salt added."  Eat fresh foods.  Eat more vegetables, fruits, and low-fat dairy products.  Choose whole grains. Look for the word "whole" as the first word in the ingredient list.  Choose fish and skinless chicken or [place] more often than red meat. Limit fish, poultry, and meat to 6 oz (  170 g) each day.  Limit sweets, desserts, sugars, and sugary drinks.  Choose heart-healthy fats.  Limit cheese to 1 oz (28 g) per day.  Eat more home-cooked food and less restaurant, buffet, and fast food.  Limit fried foods.  Cook foods using methods other than frying.  Limit canned vegetables. If you do use them, rinse them well to decrease the sodium.  When eating at a restaurant, ask that your food be  prepared with less salt, or no salt if possible. WHAT FOODS CAN I EAT? Seek help from a dietitian for individual calorie needs. Grains Whole grain or whole wheat bread. Brown rice. Whole grain or whole wheat pasta. Quinoa, bulgur, and whole grain cereals. Low-sodium cereals. Corn or whole wheat flour tortillas. Whole grain cornbread. Whole grain crackers. Low-sodium crackers. Vegetables Fresh or frozen vegetables (raw, steamed, roasted, or grilled). Low-sodium or reduced-sodium tomato and vegetable juices. Low-sodium or reduced-sodium tomato sauce and paste. Low-sodium or reduced-sodium canned vegetables.  Fruits All fresh, canned (in natural juice), or frozen fruits. Meat and Other Protein Products Ground beef (85% or leaner), grass-fed beef, or beef trimmed of fat. Skinless chicken or [place]. Ground chicken or [place]. Pork trimmed of fat. All fish and seafood. Eggs. Dried beans, peas, or lentils. Unsalted nuts and seeds. Unsalted canned beans. Dairy Low-fat dairy products, such as skim or 1% milk, 2% or reduced-fat cheeses, low-fat ricotta or cottage cheese, or plain low-fat yogurt. Low-sodium or reduced-sodium cheeses. Fats and Oils Tub margarines without trans fats. Light or reduced-fat mayonnaise and salad dressings (reduced sodium). Avocado. Safflower, olive, or canola oils. Natural peanut or almond butter. Other Unsalted popcorn and pretzels. The items listed above may not be a complete list of recommended foods or beverages. Contact your dietitian for more options. WHAT FOODS ARE NOT RECOMMENDED? Grains White bread. White pasta. White rice. Refined cornbread. Bagels and croissants. Crackers that contain trans fat. Vegetables Creamed or fried vegetables. Vegetables in a cheese sauce. Regular canned vegetables. Regular canned tomato sauce and paste. Regular tomato and vegetable juices. Fruits Dried fruits. Canned fruit in light or heavy syrup. Fruit juice. Meat and Other Protein  Products Fatty cuts of meat. Ribs, chicken wings, bacon, sausage, bologna, salami, chitterlings, fatback, hot dogs, bratwurst, and packaged luncheon meats. Salted nuts and seeds. Canned beans with salt. Dairy Whole or 2% milk, cream, half-and-half, and cream cheese. Whole-fat or sweetened yogurt. Full-fat cheeses or blue cheese. Nondairy creamers and whipped toppings. Processed cheese, cheese spreads, or cheese curds. Condiments Onion and garlic salt, seasoned salt, table salt, and sea salt. Canned and packaged gravies. Worcestershire sauce. Tartar sauce. Barbecue sauce. Teriyaki sauce. Soy sauce, including reduced sodium. Steak sauce. Fish sauce. Oyster sauce. Cocktail sauce. Horseradish. Ketchup and mustard. Meat flavorings and tenderizers. Bouillon cubes. Hot sauce. Tabasco sauce. Marinades. Taco seasonings. Relishes. Fats and Oils Butter, stick margarine, lard, shortening, ghee, and bacon fat. Coconut, palm kernel, or palm oils. Regular salad dressings. Other Pickles and olives. Salted popcorn and pretzels. The items listed above may not be a complete list of foods and beverages to avoid. Contact your dietitian for more information. WHERE CAN I FIND MORE INFORMATION? National Heart, Lung, and Blood Institute: CableProm[employer].itwww.nhlbi.nih.gov/health/health-topics/topics/dash/   This information is not intended to replace advice given to you by your health care provider. Make sure you discuss any questions you have with your health care provider.   Document Released: 03/16/2011 Document Revised: 04/17/2014 Document Reviewed: 01/29/2013 Elsevier Interactive Patient Education 2016 [employer] Hypertension Hypertension, commonly called  high blood pressure, is when the force of blood pumping through your arteries is too strong. Your arteries are the blood vessels that carry blood from your heart throughout your body. A blood pressure reading consists of a higher number over a lower number, such as 110/72. The  higher number (systolic) is the pressure inside your arteries when your heart pumps. The lower number (diastolic) is the pressure inside your arteries when your heart relaxes. Ideally you want your blood pressure below 120/80. Hypertension forces your heart to work harder to pump blood. Your arteries may become narrow or stiff. Having untreated or uncontrolled hypertension can cause heart attack, stroke, kidney disease, and other problems. RISK FACTORS Some risk factors for high blood pressure are controllable. Others are not.  Risk factors you cannot control include:   Race. You may be at higher risk if you are African American.  Age. Risk increases with age.  Gender. Men are at higher risk than women before age 37 years. After age 10, women are at higher risk than men. Risk factors you can control include:  Not getting enough exercise or physical activity.  Being overweight.  Getting too much fat, sugar, calories, or salt in your diet.  Drinking too much alcohol. SIGNS AND SYMPTOMS Hypertension does not usually cause signs or symptoms. Extremely high blood pressure (hypertensive crisis) may cause headache, anxiety, shortness of breath, and nosebleed. DIAGNOSIS To check if you have hypertension, your health care provider will measure your blood pressure while you are seated, with your arm held at the level of your heart. It should be measured at least twice using the same arm. Certain conditions can cause a difference in blood pressure between your right and left arms. A blood pressure reading that is higher than normal on one occasion does not mean that you need treatment. If it is not clear whether you have high blood pressure, you may be asked to return on a different day to have your blood pressure checked again. Or, you may be asked to monitor your blood pressure at home for 1 or more weeks. TREATMENT Treating high blood pressure includes making lifestyle changes and possibly taking  medicine. Living a healthy lifestyle can help lower high blood pressure. You may need to change some of your habits. Lifestyle changes may include:  Following the DASH diet. This diet is high in fruits, vegetables, and whole grains. It is low in salt, red meat, and added sugars.  Keep your sodium intake below 2,300 mg per day.  Getting at least 30-45 minutes of aerobic exercise at least 4 times per week.  Losing weight if necessary.  Not smoking.  Limiting alcoholic beverages.  Learning ways to reduce stress. Your health care provider may prescribe medicine if lifestyle changes are not enough to get your blood pressure under control, and if one of the following is true:  You are 88-51 years of age and your systolic blood pressure is above 140.  You are 56 years of age or older, and your systolic blood pressure is above 150.  Your diastolic blood pressure is above 90.  You have diabetes, and your systolic blood pressure is over 140 or your diastolic blood pressure is over 90.  You have kidney disease and your blood pressure is above 140/90.  You have heart disease and your blood pressure is above 140/90. Your personal target blood pressure may vary depending on your medical conditions, your age, and other factors. HOME CARE INSTRUCTIONS  Have your  blood pressure rechecked as directed by your health care provider.   Take medicines only as directed by your health care provider. Follow the directions carefully. Blood pressure medicines must be taken as prescribed. The medicine does not work as well when you skip doses. Skipping doses also puts you at risk for problems.  Do not smoke.   Monitor your blood pressure at home as directed by your health care provider. SEEK MEDICAL CARE IF:   You think you are having a reaction to medicines taken.  You have recurrent headaches or feel dizzy.  You have swelling in your ankles.  You have trouble with your vision. SEEK IMMEDIATE  MEDICAL CARE IF:  You develop a severe headache or confusion.  You have unusual weakness, numbness, or feel faint.  You have severe chest or abdominal pain.  You vomit repeatedly.  You have trouble breathing. MAKE SURE YOU:   Understand these instructions.  Will watch your condition.  Will get help right away if you are not doing well or get worse.   This information is not intended to replace advice given to you by your health care provider. Make sure you discuss any questions you have with your health care provider.   Document Released: 03/27/2005 Document Revised: 08/11/2014 Document Reviewed: 01/17/2013 Elsevier Interactive Patient Education [employer]

## 2015-02-08 ENCOUNTER — Encounter: Payer: Self-pay | Admitting: Family Medicine

## 2015-02-08 ENCOUNTER — Encounter (HOSPITAL_COMMUNITY): Payer: Self-pay | Admitting: Emergency Medicine

## 2015-02-08 ENCOUNTER — Emergency Department (HOSPITAL_COMMUNITY)
Admission: EM | Admit: 2015-02-08 | Discharge: 2015-02-08 | Disposition: A | Discharge status: Home / Self Care | Payer: BLUE CROSS/BLUE SHIELD | Attending: Emergency Medicine | Admitting: Emergency Medicine

## 2015-02-08 ENCOUNTER — Ambulatory Visit (INDEPENDENT_AMBULATORY_CARE_PROVIDER_SITE_OTHER): Payer: BLUE CROSS/BLUE SHIELD | Admitting: Family Medicine

## 2015-02-08 VITALS — BP 220/110 | HR 96

## 2015-02-08 DIAGNOSIS — Z8639 Personal history of other endocrine, nutritional and metabolic disease: Secondary | ICD-10-CM | POA: Insufficient documentation

## 2015-02-08 DIAGNOSIS — Z79899 Other long term (current) drug therapy: Secondary | ICD-10-CM | POA: Diagnosis not present

## 2015-02-08 DIAGNOSIS — I10 Essential (primary) hypertension: Secondary | ICD-10-CM | POA: Diagnosis not present

## 2015-02-08 DIAGNOSIS — Z8669 Personal history of other diseases of the nervous system and sense organs: Secondary | ICD-10-CM | POA: Insufficient documentation

## 2015-02-08 DIAGNOSIS — G43909 Migraine, unspecified, not intractable, without status migrainosus: Secondary | ICD-10-CM | POA: Diagnosis not present

## 2015-02-08 DIAGNOSIS — E876 Hypokalemia: Secondary | ICD-10-CM

## 2015-02-08 DIAGNOSIS — M329 Systemic lupus erythematosus, unspecified: Secondary | ICD-10-CM | POA: Insufficient documentation

## 2015-02-08 DIAGNOSIS — Z3202 Encounter for pregnancy test, result negative: Secondary | ICD-10-CM | POA: Insufficient documentation

## 2015-02-08 LAB — CBC WITH DIFFERENTIAL/PLATELET
BASOS ABS: 0.1 10*3/uL (ref 0.0–0.1)
Basophils Relative: 0 %
EOS ABS: 0.4 10*3/uL (ref 0.0–0.7)
EOS PCT: 3 %
HCT: 42.2 % (ref 36.0–46.0)
HEMOGLOBIN: 14.7 g/dL (ref 12.0–15.0)
LYMPHS ABS: 4.1 10*3/uL — AB (ref 0.7–4.0)
LYMPHS PCT: 26 %
MCH: 29.1 pg (ref 26.0–34.0)
MCHC: 34.8 g/dL (ref 30.0–36.0)
MCV: 83.4 fL (ref 78.0–100.0)
Monocytes Absolute: 1 10*3/uL (ref 0.1–1.0)
Monocytes Relative: 6 %
NEUTROS PCT: 65 %
Neutro Abs: 10.2 10*3/uL — ABNORMAL HIGH (ref 1.7–7.7)
PLATELETS: 288 10*3/uL (ref 150–400)
RBC: 5.06 MIL/uL (ref 3.87–5.11)
RDW: 13.7 % (ref 11.5–15.5)
WBC: 15.8 10*3/uL — AB (ref 4.0–10.5)

## 2015-02-08 LAB — COMPREHENSIVE METABOLIC PANEL
ALT: 60 U/L — AB (ref 14–54)
AST: 45 U/L — AB (ref 15–41)
Albumin: 3.6 g/dL (ref 3.5–5.0)
Alkaline Phosphatase: 63 U/L (ref 38–126)
Anion gap: 11 (ref 5–15)
BUN: 13 mg/dL (ref 6–20)
CHLORIDE: 95 mmol/L — AB (ref 101–111)
CO2: 28 mmol/L (ref 22–32)
CREATININE: 0.73 mg/dL (ref 0.44–1.00)
Calcium: 9.1 mg/dL (ref 8.9–10.3)
GFR calc Af Amer: >60 mL/min (ref >60)
GFR calc non Af Amer: >60 mL/min (ref >60)
Glucose, Bld: 104 mg/dL — ABNORMAL HIGH (ref 65–99)
POTASSIUM: 3.2 mmol/L — AB (ref 3.5–5.1)
SODIUM: 134 mmol/L — AB (ref 135–145)
Total Bilirubin: 0.6 mg/dL (ref 0.3–1.2)
Total Protein: 6.5 g/dL (ref 6.5–8.1)

## 2015-02-08 LAB — CBC W/MCH & 3 PART DIFF
HCT: 43.4 % (ref 36.0–46.0)
HEMOGLOBIN: 15 g/dL (ref 12.0–15.0)
LYMPHS ABS: 3.8 10*3/uL (ref 0.7–4.0)
LYMPHS PCT: 27 % (ref 12–46)
MCH: 29.5 pg (ref 26.0–34.0)
MCHC: 34.6 g/dL (ref 30.0–36.0)
MCV: 85.3 fL (ref 78.0–100.0)
Neutro Abs: 9.4 10*3/uL — ABNORMAL HIGH (ref 1.7–7.7)
Neutrophils Relative %: 66 % (ref 43–77)
PLATELETS: 332 10*3/uL (ref 150–400)
RBC: 5.09 MIL/uL (ref 3.87–5.11)
RDW: 14.6 % (ref 11.5–15.5)
WBC mixed population: 1 10*3/uL (ref 0.1–1.8)
WBC: 14.2 10*3/uL — ABNORMAL HIGH (ref 4.0–10.5)
WBCMIXPER: 7 % (ref 3–18)

## 2015-02-08 LAB — URINE CULTURE

## 2015-02-08 LAB — I-STAT BETA HCG BLOOD, ED (MC, WL, AP ONLY): I-stat hCG, quantitative: <5 m[IU]/mL (ref <5)

## 2015-02-08 LAB — BASIC METABOLIC PANEL
BUN: 16 mg/dL (ref 7–25)
CHLORIDE: 97 mmol/L — AB (ref 98–110)
CO2: 27 mmol/L (ref 20–31)
Calcium: 9.1 mg/dL (ref 8.6–10.2)
Creat: 0.7 mg/dL (ref 0.50–1.10)
GLUCOSE: 114 mg/dL — AB (ref 70–99)
POTASSIUM: 3.6 mmol/L (ref 3.5–5.3)
SODIUM: 134 mmol/L — AB (ref 135–146)

## 2015-02-08 MED ORDER — TRAMADOL HCL 50 MG PO TABS
50.0000 mg | ORAL_TABLET | Freq: Once | ORAL | Status: AC
  Administered 2015-02-08: 50 mg via ORAL
  Filled 2015-02-08: qty 1

## 2015-02-08 MED ORDER — OXYCODONE-ACETAMINOPHEN 5-325 MG PO TABS: 1.0000 | ORAL_TABLET | Freq: Three times a day (TID) | ORAL | Status: DC | PRN

## 2015-02-08 MED ORDER — HYDROCHLOROTHIAZIDE 25 MG PO TABS
25.0000 mg | ORAL_TABLET | Freq: Every day | ORAL | Status: DC
  Administered 2015-02-08: 25 mg via ORAL
  Filled 2015-02-08: qty 1

## 2015-02-08 MED ORDER — HYDRALAZINE HCL 25 MG PO TABS
25.0000 mg | ORAL_TABLET | Freq: Three times a day (TID) | ORAL | Status: DC
  Administered 2015-02-08: 25 mg via ORAL
  Filled 2015-02-08 (×2): qty 1

## 2015-02-08 MED ORDER — HYDRALAZINE HCL 25 MG PO TABS: 25.0000 mg | ORAL_TABLET | Freq: Three times a day (TID) | ORAL | Status: DC

## 2015-02-08 MED ORDER — TRAMADOL HCL 50 MG PO TABS: 50.0000 mg | ORAL_TABLET | Freq: Four times a day (QID) | ORAL | Status: DC | PRN

## 2015-02-08 MED ORDER — CLONIDINE HCL 0.1 MG PO TABS
0.1000 mg | ORAL_TABLET | Freq: Once | ORAL | Status: AC
  Administered 2015-02-08: 0.1 mg via ORAL

## 2015-02-08 NOTE — ED Notes (Signed)
Pt from Dr. Deirdre Peerurham's office for eval of  Constant HTN with minimal effect from home meds. Pt denis any headache, or numbness or tingling. Pt alert and oriented, currently on HCTZ.

## 2015-02-08 NOTE — Progress Notes (Signed)
Patient ID: Jennifer Diaz, female   DOB: May 13, 1970, 44 y.o.   MRN: [id number]   Subjective:    Patient ID: Jennifer Diaz, female    DOB: 02-12-1971, 44 y.o.   MRN: [id number]  Patient presents for HTN and S/P Fall  Pt here to F/U HTN- her blood pressure has been significantly elevated since she presented to the emergency 1 well angioedema of her lips and face. She also had blistering rash on her hands. There was concern for some type of reaction but unclear what 2. Her blood pressure at that time was 232/126. She been taking her medications as prescribed and previously her blood pressure than well controlled. She is on metoprolol as well as hydrochlorothiazide 25 mg once a day. She went to see her allergist who didn't put her on prednisone biopsy was done it did show a drug reaction and the pathology this was thought to be secondary to Aleve which she had taken a few hours before the reaction started. She still been taking ibuprofen as needed for her foot and her back. She was actually [place] [address] at that time she twisted her foot and fell and she sustained in no full fracture to the lateral malleolus of the left ankle. Her blood pressure in the emergency room was initially 214/144 but decreased some after her pain was controlled. She went to the walk-in orthopedics clinic yesterday had Delbert Harness her blood pressure was severely elevated they placed him on different beat and then sent her to the emergency room to be evaluated. She was given hydralazine which she has not picked up yet but she was given 2 tablets in the emergency room her blood pressure initially was 221/113 A came down to 169/101. She denies any chest pain or shortness of breath no dizziness no headache she's not had a problem with urination. Her labs are reviewed this showed mild hyponatremia as well as hypokalemia she denies any vomiting she's had some mild loose stools but nothing out of the ordinary. EKG from  10/30 and 10/31 showed normal sinus rhythm with some flipped T waves in the lateral leads     Review Of Systems:  GEN- denies fatigue, fever, weight loss,weakness, recent illness HEENT- denies eye drainage, change in vision, nasal discharge, CVS- denies chest pain, palpitations RESP- denies SOB, cough, wheeze ABD- denies N/V, change in stools, abd pain GU- denies dysuria, hematuria, dribbling, incontinence MSK- + joint pain, muscle aches, injury Neuro- denies headache, dizziness, syncope, seizure activity       Objective:    BP 220/110 mmHg  Pulse 96  LMP 01/24/2015 GEN- NAD, alert and oriented x3 HEENT- PERRL, EOMI, non injected sclera, pink conjunctiva, MMM, oropharynx clear Neck- Supple, no thyromegaly CVS- RRR, no murmur RESP-CTAB ABD-NABS,soft,NT,ND EXT- No edema, Left foot in Boot MSK- Good ROM right shoulder, mild TTP post shoulder, no bruising, neg empty can, biceps in tact  Pulses- Radial, DP- 2+  EKG- NSR- St depression AVL Compared to EKG from the weekend       Assessment & Plan:      Problem List Items Addressed This Visit    Essential hypertension - Primary   Relevant Medications   cloNIDine (CATAPRES) tablet 0.1 mg (Completed)   hydrALAZINE (APRESOLINE) 25 MG tablet   Other Relevant Orders   EKG 12-Lead (Completed)    Other Visit Diagnoses    Essential hypertension, malignant        BP did not improve with clonidine 0.1mg ,  she has had severely elevated BP for > 1 week, though assymptomatic. EKG also with vague changes, she is taking Toprol and HCTZ.  She has fairly normal Echo in 2008, normal renal US in 2010. Her BP up until this recent allergic reaction has been good. Unclear if this is just reaction to NSAIDS- as she was already told to stop Aleve, renal stenosis- though function is normal, potassium low, Fluid overload, though no overt symptoms of heart failure, and unable to get accurate weight with her foot fracture. Note she also fell this AM she  just "lost of balance". ] Will send to ER, may need overnight admission for BP control.     Relevant Medications    cloNIDine (CATAPRES) tablet 0.1 mg (Completed)    hydrALAZINE (APRESOLINE) 25 MG tablet    Other Relevant Orders    Basic metabolic panel    CBC w/[facility] & 3 Part Diff (Completed)    EKG 12-Lead (Completed)    Hypokalemia        Relevant Orders    Basic metabolic panel    EKG 12-Lead (Completed)       Note: This dictation was prepared with Dragon dictation along with smaller phrase technology. Any transcriptional errors that result from this process are unintentional.

## 2015-02-08 NOTE — Discharge Instructions (Signed)
Hypertension Hypertension, commonly called high blood pressure, is when the force of blood pumping through your arteries is too strong. Your arteries are the blood vessels that carry blood from your heart throughout your body. A blood pressure reading consists of a higher number over a lower number, such as 110/72. The higher number (systolic) is the pressure inside your arteries when your heart pumps. The lower number (diastolic) is the pressure inside your arteries when your heart relaxes. Ideally you want your blood pressure below 120/80. Hypertension forces your heart to work harder to pump blood. Your arteries may become narrow or stiff. Having untreated or uncontrolled hypertension can cause heart attack, stroke, kidney disease, and other problems. RISK FACTORS Some risk factors for high blood pressure are controllable. Others are not.  Risk factors you cannot control include:   Race. You may be at higher risk if you are African American.  Age. Risk increases with age.  Gender. Men are at higher risk than women before age 45 years. After age 65, women are at higher risk than men. Risk factors you can control include:  Not getting enough exercise or physical activity.  Being overweight.  Getting too much fat, sugar, calories, or salt in your diet.  Drinking too much alcohol. SIGNS AND SYMPTOMS Hypertension does not usually cause signs or symptoms. Extremely high blood pressure (hypertensive crisis) may cause headache, anxiety, shortness of breath, and nosebleed. DIAGNOSIS To check if you have hypertension, your health care provider will measure your blood pressure while you are seated, with your arm held at the level of your heart. It should be measured at least twice using the same arm. Certain conditions can cause a difference in blood pressure between your right and left arms. A blood pressure reading that is higher than normal on one occasion does not mean that you need treatment. If  it is not clear whether you have high blood pressure, you may be asked to return on a different day to have your blood pressure checked again. Or, you may be asked to monitor your blood pressure at home for 1 or more weeks. TREATMENT Treating high blood pressure includes making lifestyle changes and possibly taking medicine. Living a healthy lifestyle can help lower high blood pressure. You may need to change some of your habits. Lifestyle changes may include:  Following the DASH diet. This diet is high in fruits, vegetables, and whole grains. It is low in salt, red meat, and added sugars.  Keep your sodium intake below 2,300 mg per day.  Getting at least 30-45 minutes of aerobic exercise at least 4 times per week.  Losing weight if necessary.  Not smoking.  Limiting alcoholic beverages.  Learning ways to reduce stress. Your health care provider may prescribe medicine if lifestyle changes are not enough to get your blood pressure under control, and if one of the following is true:  You are 18-59 years of age and your systolic blood pressure is above 140.  You are 60 years of age or older, and your systolic blood pressure is above 150.  Your diastolic blood pressure is above 90.  You have diabetes, and your systolic blood pressure is over 140 or your diastolic blood pressure is over 90.  You have kidney disease and your blood pressure is above 140/90.  You have heart disease and your blood pressure is above 140/90. Your personal target blood pressure may vary depending on your medical conditions, your age, and other factors. HOME CARE INSTRUCTIONS    Have your blood pressure rechecked as directed by your health care provider.   Take medicines only as directed by your health care provider. Follow the directions carefully. Blood pressure medicines must be taken as prescribed. The medicine does not work as well when you skip doses. Skipping doses also puts you at risk for  problems.  Do not smoke.   Monitor your blood pressure at home as directed by your health care provider. SEEK MEDICAL CARE IF:   You think you are having a reaction to medicines taken.  You have recurrent headaches or feel dizzy.  You have swelling in your ankles.  You have trouble with your vision. SEEK IMMEDIATE MEDICAL CARE IF:  You develop a severe headache or confusion.  You have unusual weakness, numbness, or feel faint.  You have severe chest or abdominal pain.  You vomit repeatedly.  You have trouble breathing. MAKE SURE YOU:   Understand these instructions.  Will watch your condition.  Will get help right away if you are not doing well or get worse.   This information is not intended to replace advice given to you by your health care provider. Make sure you discuss any questions you have with your health care provider.   Document Released: 03/27/2005 Document Revised: 08/11/2014 Document Reviewed: 01/17/2013 Elsevier Interactive Patient Education 2016 [employer] Inc[employer]  

## 2015-02-08 NOTE — ED Provider Notes (Signed)
CSN: [id number]     Arrival date & time 02/08/15  1328 History   First MD Initiated Contact with Patient 02/08/15 1451     Chief Complaint  Patient presents with  . Hypertension     (Consider location/radiation/quality/duration/timing/severity/associated sxs/prior Treatment) Patient is a 44 y.o. female presenting with hypertension.  Hypertension This is a new problem. Episode onset: last 10 days, previous htn, hctz 25mg , and metoprolol. The problem occurs constantly. The problem has been gradually worsening. Pertinent negatives include no chest pain, no abdominal pain, no headaches and no shortness of breath. Nothing aggravates the symptoms. Nothing relieves the symptoms. Treatments tried: hydralazine started yesterday.    Past Medical History  Diagnosis Date  . Allergy   . Hypertension   . Hyperlipidemia   . Migraine   . Sleep apnea    Past Surgical History  Procedure Laterality Date  . Essure  April 2013    GYN- Dr. Algie CofferFogelman   Family History  Problem Relation Age of Onset  . Diabetes Mother   . Hypertension Father   . Diabetes Brother    Social History  Substance Use Topics  . Smoking status: Never Smoker   . Smokeless tobacco: Never Used  . Alcohol Use: Yes     Comment: 4-6 drinks per month   OB History    No data available     Review of Systems  Constitutional: Negative for fever.  HENT: Negative for sore throat.   Eyes: Negative for visual disturbance.  Respiratory: Negative for cough and shortness of breath.   Cardiovascular: Negative for chest pain.  Gastrointestinal: Negative for nausea, vomiting, abdominal pain and diarrhea.  Genitourinary: Negative for difficulty urinating.  Musculoskeletal: Positive for arthralgias. Negative for back pain and neck pain.  Skin: Negative for rash.  Neurological: Negative for dizziness, syncope, facial asymmetry, speech difficulty, weakness, numbness and headaches.      Allergies  Aleve; Lisinopril; and  Norvasc  Home Medications   Prior to Admission medications   Medication Sig Start Date End Date Taking? Authorizing Provider  hydrochlorothiazide (HYDRODIURIL) 25 MG tablet TAKE 1 TABLET BY MOUTH EVERY DAY 01/08/15  Yes Salley ScarletKawanta F Saxon, MD  metoprolol succinate (TOPROL-XL) 100 MG 24 hr tablet Take 100 mg by mouth daily. Take with or immediately following a meal.   Yes Historical Provider, MD  metoprolol succinate (TOPROL-XL) 100 MG 24 hr tablet TAKE 1 TABLET BY MOUTH DAILY EITHER WITH OR IMMEDIATELY FOLLOWING A MEAL Patient not taking: Reported on 02/08/2015 02/05/15   Salley ScarletKawanta F [place], MD  oxyCODONE-acetaminophen (ROXICET) 5-325 MG tablet Take 1 tablet by mouth every 8 (eight) hours as needed for severe pain. Patient not taking: Reported on 02/08/2015 02/08/15   Salley ScarletKawanta F [place], MD  traMADol (ULTRAM) 50 MG tablet Take 1 tablet (50 mg total) by mouth every 6 (six) hours as needed for moderate pain or severe pain. 02/08/15   Alvira MondayErin Kathleene Bergemann, MD   BP 127/80 mmHg  Pulse 73  Temp(Src) 98 F (36.7 C) (Oral)  Resp 16  Ht 5\' 1"  (1.549 m)  Wt 150 lb (68.04 kg)  BMI 28.36 kg/m2  SpO2 99%  LMP 01/24/2015 Physical Exam  Constitutional: She is oriented to person, place, and time. She appears well-developed and well-nourished. No distress.  HENT:  Head: Normocephalic and atraumatic.  Eyes: Conjunctivae and EOM are normal.  Neck: Normal range of motion.  Cardiovascular: Normal rate, regular rhythm, normal heart sounds and intact distal pulses.  Exam reveals no gallop and no friction  rub.   No murmur heard. Pulmonary/Chest: Effort normal and breath sounds normal. No respiratory distress. She has no wheezes. She has no rales.  Abdominal: Soft. She exhibits no distension. There is no tenderness. There is no guarding.  Musculoskeletal: She exhibits no edema or tenderness.  Walking boot in place, symmetric LE without signs of edema   Neurological: She is alert and oriented to person, place, and  time. No cranial nerve deficit or sensory deficit. Coordination and gait normal. GCS eye subscore is 4. GCS verbal subscore is 5. GCS motor subscore is 6.  Skin: Skin is warm and dry. No rash noted. She is not diaphoretic. No erythema.  Nursing note and vitals reviewed.   ED Course  Procedures (including critical care time) Labs Review Labs Reviewed  CBC WITH DIFFERENTIAL/PLATELET - Abnormal; Notable for the following:    WBC 15.8 (*)    Neutro Abs 10.2 (*)    Lymphs Abs 4.1 (*)    All other components within normal limits  COMPREHENSIVE METABOLIC PANEL - Abnormal; Notable for the following:    Sodium 134 (*)    Potassium 3.2 (*)    Chloride 95 (*)    Glucose, Bld 104 (*)    AST 45 (*)    ALT 60 (*)    All other components within normal limits  I-STAT BETA HCG BLOOD, ED ([facility], [facility], [facility] ONLY)    Imaging Review No results found. I have personally reviewed and evaluated these images and lab results as part of my medical decision-making.   EKG Interpretation None      MDM   Final diagnoses:  Essential hypertension     44 year old female with a history of hypertension, hyperlipidemia , recent evaluation for allergic reaction , presents with concern of persistent hypertension from her PCP. Patient previously had well-controlled blood pressures up until 10 days ago after her allergic reaction she's developed blood pressures that a bit elevated noted 200 systolic. She was seen today in the office and given a clonidine without initial response and was sent to the emergency department for further care.  Patient without headache, no neurologic symptoms, no chest pain, no shortness of breath and have low suspicion for hypertensive emergencies including low suspicion for [facility], hypertensive encephalopathy, stroke, MI, aortic dissection, pulmonary edema.  CMP pending at time of transfer of care, however if not significantly abnormal, patient will be advised to follow up with PCP. Provided patient  with dose of home medication hctz and hydralazine (just initiated yesterday). Discussed with patient's PCP who has initiated increase in her home medications with rx called to pharmacy. Gave rx for tramadol given recent fx and pt does not want to take hydrocodone she was rx. Discussed importance of close primary care follow up and reasons to return to the ED in detail.           Alvira Monday, MD 02/08/15 2225

## 2015-02-08 NOTE — ED Provider Notes (Signed)
Please see previous physicians note regarding patient's presenting history and physical, initial ED course, and associated MDM. In short this a 44 year old female who presents with asymptomatic hypertension. Had received clonidine at the PCPs office, with blood pressures improved here in the ED. She is asymptomatic. At signout, she is pending, but his metabolic panel for evaluation of her renal function. She does not have evidence of acute kidney injury or renal failure. Hyperkalemia noted on blood work today prior has been improving. She will follow up very closely with her primary care doctor, and has blood pressure medication changes made by her PCP just prior to ED arrival. Strict return and follow-up instructions reviewed. She expressed understanding of all discharge instructions and felt comfortable with the plan of care.   Jennifer Guiseana Duo Lilliam Chamblee, MD 02/08/15 [phone]

## 2015-02-08 NOTE — Patient Instructions (Addendum)
No Ibuprofen,no BC poweder, no advil Take the percocet only Go directly to Emergency Room

## 2015-02-09 ENCOUNTER — Encounter: Payer: Self-pay | Admitting: Family Medicine

## 2015-02-09 MED FILL — Hydrocodone-Acetaminophen Tab 5-325 MG: ORAL | Qty: 6 | Status: AC

## 2015-02-11 ENCOUNTER — Encounter: Payer: Self-pay | Admitting: Family Medicine

## 2015-02-16 ENCOUNTER — Encounter: Payer: Self-pay | Admitting: Family Medicine

## 2015-02-17 MED ORDER — CLONIDINE HCL 0.1 MG PO TABS: 0.1000 mg | ORAL_TABLET | Freq: Two times a day (BID) | ORAL | Status: DC

## 2015-03-16 ENCOUNTER — Other Ambulatory Visit: Payer: Self-pay | Admitting: Family Medicine

## 2015-03-16 DIAGNOSIS — Z1231 Encounter for screening mammogram for malignant neoplasm of breast: Secondary | ICD-10-CM

## 2015-03-17 ENCOUNTER — Ambulatory Visit (HOSPITAL_COMMUNITY)
Admission: RE | Admit: 2015-03-17 | Discharge: 2015-03-17 | Disposition: A | Discharge status: Home / Self Care | Payer: BLUE CROSS/BLUE SHIELD | Source: Ambulatory Visit | Attending: Family Medicine | Admitting: Family Medicine

## 2015-03-17 DIAGNOSIS — Z1231 Encounter for screening mammogram for malignant neoplasm of breast: Secondary | ICD-10-CM | POA: Insufficient documentation

## 2015-04-12 ENCOUNTER — Encounter: Payer: Self-pay | Admitting: Family Medicine

## 2015-04-13 ENCOUNTER — Telehealth: Payer: Self-pay | Admitting: Family Medicine

## 2015-04-13 MED ORDER — METHYLPREDNISOLONE 4 MG PO TABS: 4.0000 mg | ORAL_TABLET | Freq: Every day | ORAL | Status: DC

## 2015-04-13 MED ORDER — METHYLPREDNISOLONE 4 MG PO TBPK: ORAL_TABLET | ORAL | Status: DC

## 2015-04-13 NOTE — Telephone Encounter (Signed)
[facility] left a voicemail needing clarification on a medication that was called in. [phone]

## 2015-04-13 NOTE — Telephone Encounter (Signed)
Call placed to [facility].   Clarified Medrol Dose Pack.

## 2015-04-14 ENCOUNTER — Other Ambulatory Visit: Payer: Self-pay | Admitting: Family Medicine

## 2015-04-15 NOTE — Telephone Encounter (Signed)
Refill appropriate and filled per protocol. 

## 2015-05-15 ENCOUNTER — Other Ambulatory Visit: Payer: Self-pay | Admitting: Family Medicine

## 2015-05-17 NOTE — Telephone Encounter (Signed)
Refill appropriate and filled per protocol. 

## 2015-05-21 ENCOUNTER — Encounter: Payer: Self-pay | Admitting: Family Medicine

## 2015-05-21 ENCOUNTER — Ambulatory Visit (INDEPENDENT_AMBULATORY_CARE_PROVIDER_SITE_OTHER): Payer: BLUE CROSS/BLUE SHIELD | Admitting: Family Medicine

## 2015-05-21 VITALS — BP 178/92 | HR 88 | Temp 98.9°F | Resp 16 | Ht 61.0 in | Wt 184.0 lb

## 2015-05-21 DIAGNOSIS — A09 Infectious gastroenteritis and colitis, unspecified: Secondary | ICD-10-CM | POA: Diagnosis not present

## 2015-05-21 DIAGNOSIS — I10 Essential (primary) hypertension: Secondary | ICD-10-CM

## 2015-05-21 LAB — CBC W/MCH & 3 PART DIFF
HEMATOCRIT: 49 % — AB (ref 36.0–46.0)
HEMOGLOBIN: 15.3 g/dL — AB (ref 12.0–15.0)
LYMPHS ABS: 2.8 10*3/uL (ref 0.7–4.0)
Lymphocytes Relative: 20 % (ref 12–46)
MCH: 27.7 pg (ref 26.0–34.0)
MCHC: 31.2 g/dL (ref 30.0–36.0)
MCV: 88.8 fL (ref 78.0–100.0)
NEUTROS PCT: 71 % (ref 43–77)
Neutro Abs: 10 10*3/uL — ABNORMAL HIGH (ref 1.7–7.7)
PLATELETS: 395 10*3/uL (ref 150–400)
RBC: 5.52 MIL/uL — AB (ref 3.87–5.11)
RDW: 15.9 % — ABNORMAL HIGH (ref 11.5–15.5)
WBC mixed population: 1.3 10*3/uL (ref 0.1–1.8)
WBC: 14.1 10*3/uL — ABNORMAL HIGH (ref 4.0–10.5)
WBCMIXPER: 9 % (ref 3–18)

## 2015-05-21 MED ORDER — CIPROFLOXACIN HCL 500 MG PO TABS: 500.0000 mg | ORAL_TABLET | Freq: Two times a day (BID) | ORAL | Status: DC

## 2015-05-21 MED ORDER — PROMETHAZINE HCL 12.5 MG PO TABS: 12.5000 mg | ORAL_TABLET | Freq: Four times a day (QID) | ORAL | Status: DC | PRN

## 2015-05-21 NOTE — Progress Notes (Signed)
Patient ID: Jennifer Diaz, female   DOB: 03-14-1971, 45 y.o.   MRN: [id number]    Subjective:    Patient ID: Jennifer Diaz, female    DOB: 1970-10-27, 45 y.o.   MRN: [id number]  Patient presents for Illness  patient here with diarrhea for the past 3 days. 6-8 water stools a day. She has been returned from [place] on Wednesday. She is in the past. She has been using bottled water watching where she was getting that she is aware of sanitary  conditions. However the last day she did have a meal with the family and she is not sure if the water was filtered/ Her mother who went with her is not currently ill. She's had mild low-grade fever and body aches and mild cough that started this morning. She has not noted any blood in the stool. She has been taking her blood pressure medication monitoring her blood pressure at home 140-150over 70s    Review Of Systems:  GEN- denies fatigue, fever, weight loss,weakness, recent illness HEENT- denies eye drainage, change in vision, nasal discharge, CVS- denies chest pain, palpitations RESP- denies SOB, +cough, wheeze ABD- + N/ no V, +change in stools, +abd pain GU- denies dysuria, hematuria, dribbling, incontinence MSK- denies joint pain, muscle aches, injury Neuro- denies headache, dizziness, syncope, seizure activity       Objective:    BP 178/92 mmHg  Pulse 88  Temp(Src) 98.9 F (37.2 C) (Oral)  Resp 16  Ht  (1.549 m)  Wt 184 lb (83.462 kg)  BMI 34.78 kg/m2 GEN- NAD, alert and oriented x3 HEENT- PERRL, EOMI, non injected sclera, pink conjunctiva, MMM, oropharynx clear Neck- Supple, no LAD CVS- RRR, no murmur RESP-CTAB ABD-NABS,soft,mild TTP lower quadrants, no rebound, no gaurding EXT- No edema Pulses- Radial 2+        Assessment & Plan:      Problem List Items Addressed This Visit    Essential hypertension    Blood pressure quite elevated , she is taking meds, ? If absorbing with her GI issues. She will take another clonidine  this evening       Other Visit Diagnoses    Traveler's diarrhea    -  Primary    ? if contaminated water vs another underlying infection. WBC are elevated. She had stool in office, so culture, O &P, sent. Start Cipro  , no anti-diarrheal medications until results seen    Relevant Orders    CBC w/MCH & 3 Part Diff (Completed)    Comprehensive metabolic panel    Stool culture    Ova and Parasite Examination    Fecal leukocytes    Fecal occult blood, imunochemical       Note: This dictation was prepared with Dragon dictation along with smaller phrase technology. Any transcriptional errors that result from this process are unintentional.

## 2015-05-21 NOTE — Patient Instructions (Signed)
Take phenergan  Take antibiotics We will call with lab results Go to ER  F/U pending results

## 2015-05-21 NOTE — Assessment & Plan Note (Signed)
Blood pressure quite elevated , she is taking meds, ? If absorbing with her GI issues. She will take another clonidine this evening

## 2015-05-22 LAB — COMPREHENSIVE METABOLIC PANEL
ALK PHOS: 70 U/L (ref 33–115)
ALT: 74 U/L — AB (ref 6–29)
AST: 66 U/L — ABNORMAL HIGH (ref 10–35)
Albumin: 3.8 g/dL (ref 3.6–5.1)
BILIRUBIN TOTAL: 0.7 mg/dL (ref 0.2–1.2)
BUN: 9 mg/dL (ref 7–25)
CO2: 28 mmol/L (ref 20–31)
Calcium: 9.2 mg/dL (ref 8.6–10.2)
Chloride: 96 mmol/L — ABNORMAL LOW (ref 98–110)
Creat: 0.74 mg/dL (ref 0.50–1.10)
Glucose, Bld: 118 mg/dL — ABNORMAL HIGH (ref 70–99)
POTASSIUM: 3.4 mmol/L — AB (ref 3.5–5.3)
Sodium: 136 mmol/L (ref 135–146)
TOTAL PROTEIN: 6.9 g/dL (ref 6.1–8.1)

## 2015-05-22 LAB — FECAL OCCULT BLOOD, IMMUNOCHEMICAL: FECAL OCCULT BLOOD: NEGATIVE

## 2015-05-24 ENCOUNTER — Encounter: Payer: Self-pay | Admitting: Family Medicine

## 2015-05-24 ENCOUNTER — Other Ambulatory Visit: Payer: Self-pay | Admitting: Family Medicine

## 2015-05-24 ENCOUNTER — Ambulatory Visit: Payer: BLUE CROSS/BLUE SHIELD | Admitting: Family Medicine

## 2015-05-24 DIAGNOSIS — R7989 Other specified abnormal findings of blood chemistry: Secondary | ICD-10-CM

## 2015-05-24 DIAGNOSIS — R945 Abnormal results of liver function studies: Principal | ICD-10-CM

## 2015-05-24 LAB — OVA AND PARASITE EXAMINATION: OP: NONE SEEN

## 2015-05-25 ENCOUNTER — Encounter: Payer: Self-pay | Admitting: *Deleted

## 2015-05-26 LAB — STOOL CULTURE

## 2015-05-31 ENCOUNTER — Ambulatory Visit (HOSPITAL_COMMUNITY)
Admission: RE | Admit: 2015-05-31 | Discharge: 2015-05-31 | Disposition: A | Discharge status: Home / Self Care | Payer: BLUE CROSS/BLUE SHIELD | Source: Ambulatory Visit | Attending: Family Medicine | Admitting: Family Medicine

## 2015-05-31 ENCOUNTER — Other Ambulatory Visit: Payer: Self-pay | Admitting: *Deleted

## 2015-05-31 DIAGNOSIS — R7989 Other specified abnormal findings of blood chemistry: Secondary | ICD-10-CM | POA: Diagnosis present

## 2015-05-31 DIAGNOSIS — Z13220 Encounter for screening for lipoid disorders: Secondary | ICD-10-CM

## 2015-05-31 DIAGNOSIS — K760 Fatty (change of) liver, not elsewhere classified: Secondary | ICD-10-CM

## 2015-05-31 DIAGNOSIS — R945 Abnormal results of liver function studies: Secondary | ICD-10-CM

## 2015-06-01 ENCOUNTER — Other Ambulatory Visit: Payer: Self-pay | Admitting: Family Medicine

## 2015-06-02 LAB — BASIC METABOLIC PANEL
BUN: 11 mg/dL (ref 7–25)
CALCIUM: 9.1 mg/dL (ref 8.6–10.2)
CHLORIDE: 102 mmol/L (ref 98–110)
CO2: 24 mmol/L (ref 20–31)
Creat: 0.7 mg/dL (ref 0.50–1.10)
GLUCOSE: 108 mg/dL — AB (ref 70–99)
Potassium: 4.1 mmol/L (ref 3.5–5.3)
SODIUM: 137 mmol/L (ref 135–146)

## 2015-06-02 LAB — LIPID PANEL
Cholesterol: 212 mg/dL — ABNORMAL HIGH (ref 125–200)
HDL: 52 mg/dL (ref >=46)
LDL CALC: 127 mg/dL (ref <130)
Total CHOL/HDL Ratio: 4.1 Ratio (ref <=5.0)
Triglycerides: 166 mg/dL — ABNORMAL HIGH (ref <150)
VLDL: 33 mg/dL — AB (ref <30)

## 2015-06-08 ENCOUNTER — Other Ambulatory Visit: Payer: Self-pay | Admitting: *Deleted

## 2015-06-08 MED ORDER — ATORVASTATIN CALCIUM 10 MG PO TABS: 10.0000 mg | ORAL_TABLET | Freq: Every day | ORAL | Status: DC

## 2015-10-01 ENCOUNTER — Other Ambulatory Visit: Payer: Self-pay | Admitting: Family Medicine

## 2015-10-01 NOTE — Telephone Encounter (Signed)
Refill appropriate and filled per protocol. 

## 2015-11-19 ENCOUNTER — Other Ambulatory Visit: Payer: Self-pay | Admitting: Family Medicine

## 2015-11-22 NOTE — Telephone Encounter (Signed)
Refill appropriate and filled per protocol. 

## 2015-12-22 ENCOUNTER — Other Ambulatory Visit: Payer: Self-pay | Admitting: Family Medicine

## 2016-02-27 ENCOUNTER — Other Ambulatory Visit: Payer: Self-pay | Admitting: Family Medicine

## 2016-07-13 ENCOUNTER — Encounter (HOSPITAL_COMMUNITY): Payer: Self-pay | Admitting: Emergency Medicine

## 2016-07-13 ENCOUNTER — Emergency Department (HOSPITAL_COMMUNITY): Payer: Self-pay

## 2016-07-13 ENCOUNTER — Inpatient Hospital Stay (HOSPITAL_COMMUNITY)
Admission: EM | Admit: 2016-07-13 | Discharge: 2016-07-16 | DRG: 247 | Disposition: A | Discharge status: Home / Self Care | Payer: Self-pay | Attending: Cardiovascular Disease | Admitting: Cardiovascular Disease

## 2016-07-13 DIAGNOSIS — Y838 Other surgical procedures as the cause of abnormal reaction of the patient, or of later complication, without mention of misadventure at the time of the procedure: Secondary | ICD-10-CM | POA: Diagnosis not present

## 2016-07-13 DIAGNOSIS — Z888 Allergy status to other drugs, medicaments and biological substances status: Secondary | ICD-10-CM

## 2016-07-13 DIAGNOSIS — R7989 Other specified abnormal findings of blood chemistry: Secondary | ICD-10-CM

## 2016-07-13 DIAGNOSIS — G4733 Obstructive sleep apnea (adult) (pediatric): Secondary | ICD-10-CM | POA: Diagnosis present

## 2016-07-13 DIAGNOSIS — E041 Nontoxic single thyroid nodule: Secondary | ICD-10-CM | POA: Diagnosis present

## 2016-07-13 DIAGNOSIS — I10 Essential (primary) hypertension: Secondary | ICD-10-CM | POA: Diagnosis present

## 2016-07-13 DIAGNOSIS — I214 Non-ST elevation (NSTEMI) myocardial infarction: Principal | ICD-10-CM

## 2016-07-13 DIAGNOSIS — R079 Chest pain, unspecified: Secondary | ICD-10-CM

## 2016-07-13 DIAGNOSIS — I2510 Atherosclerotic heart disease of native coronary artery without angina pectoris: Secondary | ICD-10-CM | POA: Diagnosis present

## 2016-07-13 DIAGNOSIS — E7800 Pure hypercholesterolemia, unspecified: Secondary | ICD-10-CM

## 2016-07-13 DIAGNOSIS — R748 Abnormal levels of other serum enzymes: Secondary | ICD-10-CM

## 2016-07-13 DIAGNOSIS — E669 Obesity, unspecified: Secondary | ICD-10-CM | POA: Diagnosis present

## 2016-07-13 DIAGNOSIS — G43909 Migraine, unspecified, not intractable, without status migrainosus: Secondary | ICD-10-CM | POA: Diagnosis present

## 2016-07-13 DIAGNOSIS — L7632 Postprocedural hematoma of skin and subcutaneous tissue following other procedure: Secondary | ICD-10-CM | POA: Diagnosis not present

## 2016-07-13 DIAGNOSIS — Z8249 Family history of ischemic heart disease and other diseases of the circulatory system: Secondary | ICD-10-CM

## 2016-07-13 DIAGNOSIS — I160 Hypertensive urgency: Secondary | ICD-10-CM | POA: Diagnosis present

## 2016-07-13 DIAGNOSIS — Z95820 Peripheral vascular angioplasty status with implants and grafts: Secondary | ICD-10-CM

## 2016-07-13 DIAGNOSIS — Z79899 Other long term (current) drug therapy: Secondary | ICD-10-CM

## 2016-07-13 DIAGNOSIS — R778 Other specified abnormalities of plasma proteins: Secondary | ICD-10-CM

## 2016-07-13 DIAGNOSIS — Z6833 Body mass index (BMI) 33.0-33.9, adult: Secondary | ICD-10-CM

## 2016-07-13 DIAGNOSIS — Z833 Family history of diabetes mellitus: Secondary | ICD-10-CM

## 2016-07-13 DIAGNOSIS — E785 Hyperlipidemia, unspecified: Secondary | ICD-10-CM | POA: Diagnosis present

## 2016-07-13 DIAGNOSIS — K589 Irritable bowel syndrome without diarrhea: Secondary | ICD-10-CM | POA: Diagnosis present

## 2016-07-13 DIAGNOSIS — Z7722 Contact with and (suspected) exposure to environmental tobacco smoke (acute) (chronic): Secondary | ICD-10-CM | POA: Diagnosis present

## 2016-07-13 DIAGNOSIS — E876 Hypokalemia: Secondary | ICD-10-CM | POA: Diagnosis present

## 2016-07-13 DIAGNOSIS — Z975 Presence of (intrauterine) contraceptive device: Secondary | ICD-10-CM

## 2016-07-13 DIAGNOSIS — Z955 Presence of coronary angioplasty implant and graft: Secondary | ICD-10-CM

## 2016-07-13 HISTORY — DX: Non-ST elevation (NSTEMI) myocardial infarction: I21.4

## 2016-07-13 HISTORY — DX: Atherosclerotic heart disease of native coronary artery without angina pectoris: I25.10

## 2016-07-13 HISTORY — DX: Nontoxic single thyroid nodule: E04.1

## 2016-07-13 HISTORY — DX: Peripheral vascular angioplasty status with implants and grafts: Z95.820

## 2016-07-13 LAB — COMPREHENSIVE METABOLIC PANEL
ALK PHOS: 88 U/L (ref 38–126)
ALT: 32 U/L (ref 14–54)
AST: 66 U/L — AB (ref 15–41)
Albumin: 4.4 g/dL (ref 3.5–5.0)
Anion gap: 11 (ref 5–15)
BILIRUBIN TOTAL: 0.5 mg/dL (ref 0.3–1.2)
BUN: 9 mg/dL (ref 6–20)
CALCIUM: 9.3 mg/dL (ref 8.9–10.3)
CO2: 28 mmol/L (ref 22–32)
CREATININE: 0.61 mg/dL (ref 0.44–1.00)
Chloride: 95 mmol/L — ABNORMAL LOW (ref 101–111)
Glucose, Bld: 122 mg/dL — ABNORMAL HIGH (ref 65–99)
Potassium: 3.1 mmol/L — ABNORMAL LOW (ref 3.5–5.1)
Sodium: 134 mmol/L — ABNORMAL LOW (ref 135–145)
TOTAL PROTEIN: 8.3 g/dL — AB (ref 6.5–8.1)

## 2016-07-13 LAB — CBC WITH DIFFERENTIAL/PLATELET
BASOS ABS: 0.1 10*3/uL (ref 0.0–0.1)
Basophils Relative: 0 %
EOS PCT: 1 %
Eosinophils Absolute: 0.2 10*3/uL (ref 0.0–0.7)
HEMATOCRIT: 44.3 % (ref 36.0–46.0)
Hemoglobin: 15.6 g/dL — ABNORMAL HIGH (ref 12.0–15.0)
LYMPHS ABS: 2.7 10*3/uL (ref 0.7–4.0)
LYMPHS PCT: 23 %
MCH: 28.1 pg (ref 26.0–34.0)
MCHC: 35.2 g/dL (ref 30.0–36.0)
MCV: 79.7 fL (ref 78.0–100.0)
Monocytes Absolute: 0.5 10*3/uL (ref 0.1–1.0)
Monocytes Relative: 4 %
NEUTROS ABS: 8.6 10*3/uL — AB (ref 1.7–7.7)
Neutrophils Relative %: 72 %
Platelets: 265 10*3/uL (ref 150–400)
RBC: 5.56 MIL/uL — ABNORMAL HIGH (ref 3.87–5.11)
RDW: 13.3 % (ref 11.5–15.5)
WBC: 12 10*3/uL — AB (ref 4.0–10.5)

## 2016-07-13 LAB — PROTIME-INR
INR: 0.93
Prothrombin Time: 12.5 seconds (ref 11.4–15.2)

## 2016-07-13 LAB — APTT: APTT: 33 s (ref 24–36)

## 2016-07-13 LAB — TROPONIN I
TROPONIN I: 1.8 ng/mL — AB (ref <0.03)
Troponin I: 4.57 ng/mL (ref <0.03)

## 2016-07-13 MED ORDER — ASPIRIN 325 MG PO TABS
325.0000 mg | ORAL_TABLET | Freq: Once | ORAL | Status: AC
  Administered 2016-07-13: 325 mg via ORAL
  Filled 2016-07-13: qty 1

## 2016-07-13 MED ORDER — HEPARIN BOLUS VIA INFUSION
4000.0000 [IU] | Freq: Once | INTRAVENOUS | Status: AC
Start: 2016-07-13 — End: 2016-07-13
  Administered 2016-07-13: 4000 [IU] via INTRAVENOUS

## 2016-07-13 MED ORDER — NITROGLYCERIN 0.4 MG SL SUBL
0.4000 mg | SUBLINGUAL_TABLET | Freq: Once | SUBLINGUAL | Status: AC
  Administered 2016-07-13: 0.4 mg via SUBLINGUAL
  Filled 2016-07-13: qty 1

## 2016-07-13 MED ORDER — HEPARIN (PORCINE) IN NACL 100-0.45 UNIT/ML-% IJ SOLN
1000.0000 [IU]/h | INTRAMUSCULAR | Status: DC
  Administered 2016-07-13: 800 [IU]/h via INTRAVENOUS
  Filled 2016-07-13: qty 250

## 2016-07-13 MED ORDER — NITROGLYCERIN IN D5W 200-5 MCG/ML-% IV SOLN
5.0000 ug/min | Freq: Once | INTRAVENOUS | Status: AC
Start: 2016-07-13 — End: 2016-07-13
  Administered 2016-07-13: 5 ug/min via INTRAVENOUS
  Filled 2016-07-13: qty 250

## 2016-07-13 MED ORDER — ONDANSETRON HCL 4 MG/2ML IJ SOLN
4.0000 mg | Freq: Once | INTRAMUSCULAR | Status: AC
  Administered 2016-07-13: 4 mg via INTRAVENOUS
  Filled 2016-07-13: qty 2

## 2016-07-13 NOTE — Progress Notes (Signed)
ANTICOAGULATION CONSULT NOTE - Initial Consult  Pharmacy Consult for heparin Indication: chest pain/ACS  Allergies  Allergen Reactions  . Aleve [Naproxen Sodium] Swelling and Other (See Comments)    Blisters to hand and mouth  . Lisinopril Cough  . Norvasc [Amlodipine Besylate] Rash    Patient Measurements: Height:  (154.9 cm) Weight: 175 lb (79.4 kg) IBW/kg (Calculated) : 47.8 HEPARIN DW (KG): 65.6  Vital Signs: Temp: 97.7 F (36.5 C) (04/05 1730) Temp Source: Oral (04/05 1730) BP: 226/117 (04/05 1830) Pulse Rate: 70 (04/05 1830)  Labs:  Recent Labs  07/13/16 1733 07/13/16 1844  HGB 15.6*  --   HCT 44.3  --   PLT 265  --   APTT  --  33  LABPROT  --  12.5  INR  --  0.93  CREATININE 0.61  --   TROPONINI 1.80*  --     Estimated Creatinine Clearance: 82.9 mL/min (by C-G formula based on SCr of 0.61 mg/dL).   Medical History: Past Medical History:  Diagnosis Date  . Allergy   . Hyperlipidemia   . Hypertension   . Migraine   . Sleep apnea     Medications:  See med rec  Assessment: 46 yo female presents to ED with cough, body aches and nausea for 6 days and left sided Chest pain radiating to left jaw.  Elevated troponin. Plan to start anticoagulation with Heparin.  Goal of Therapy:  Heparin level 0.3-0.7 units/ml Monitor platelets by anticoagulation protocol: Yes   Plan:  Give 4000 units bolus x 1 Start heparin infusion at 800 units/hr Check anti-Xa level in 6-8 hours and daily while on heparin Continue to monitor H&H and platelets  Elder Cyphers, BS Loura Back, BCPS Clinical Pharmacist Pager [phone] 07/13/2016,7:19 PM

## 2016-07-13 NOTE — ED Triage Notes (Signed)
PT c/o non-productive cough, body aches and nausea x6 days and then c/o left sided chest pains intermittent with radiation to left jaw at times starting x1 day ago.

## 2016-07-13 NOTE — ED Notes (Signed)
CRITICAL VALUE ALERT  Critical value received:  Troponin 1.80  Date of notification:  07/13/2016  Time of notification:  1834  Critical value read back: yes  Nurse who received alert:  Gaetano Hawthorne  MD notified (1st page):  Dr.  Reynolds Bowl

## 2016-07-13 NOTE — ED Provider Notes (Signed)
[facility]-EMERGENCY DEPT Provider Note   CSN: [id number] Arrival date & time: 07/13/16  1721     History   Chief Complaint Chief Complaint  Patient presents with  . Chest Pain    HPI Jennifer Diaz is a 46 y.o. female.  The history is provided by the patient.  Chest Pain   This is a new problem. The current episode started yesterday. The problem occurs hourly. The problem has been gradually worsening. Associated with: nothing, not with exertion or movement. The pain is present in the lateral region. The pain is moderate. The quality of the pain is described as sharp, heavy and stabbing. The pain radiates to the left jaw. Episode Length: last from seconds to minutes. Exacerbated by: nothing. Associated symptoms include cough and malaise/fatigue. Pertinent negatives include no abdominal pain, no back pain, no fever, no leg pain, no lower extremity edema, no numbness, no orthopnea, no PND, no shortness of breath, no syncope and no weakness. She has tried nothing for the symptoms. Risk factors include obesity.  Her past medical history is significant for hyperlipidemia and hypertension.  Pertinent negatives for past medical history include no CAD, no MI and no PE.   46 year old female who presents with chest pain. She has a history of hypertension and hyperlipidemia. One week ago was diagnosed with flulike illness with cough, congestion, body aches, fevers and chills. States that her symptoms have gotten better, with only mild residual congestion now. Yesterday evening while at rest developed sharp stabbing and pressure-like left-sided chest pain that was nonradiating. Pain intermittent, not associated with activity or exertion. With last from seconds to minutes and then self resolves. This morning woke up with left-sided jaw pain and pressure. And again the symptoms have been on and off all day today. She has not had syncope or near syncope, focal numbness or weakness, difficulty breathing, lower  extremity edema or calf tenderness. Her had symptoms like this before in the past.   Past Medical History:  Diagnosis Date  . Allergy   . Hyperlipidemia   . Hypertension   . Migraine   . Sleep apnea     Patient Active Problem List   Diagnosis Date Noted  . Migraine headache 01/20/2014  . Acute URI 01/20/2014  . Laceration of hand 08/29/2013  . Neuropathy (HCC) 08/02/2011  . Fatigue 07/05/2011  . Headache(784.0) 07/05/2011  . Family history of Crohn's disease 07/05/2011  . Hip pain, left 07/03/2011  . Hyperlipidemia 03/21/2009  . Obesity 02/08/2009  . SLEEP APNEA, CHRONIC 02/08/2009  . Essential hypertension 03/05/2006  . IBS 03/05/2006    Past Surgical History:  Procedure Laterality Date  . essure  April 2013   GYN- Dr. Algie Coffer    OB History    No data available       Home Medications    Prior to Admission medications   Medication Sig Start Date End Date Taking? Authorizing Provider  atorvastatin (LIPITOR) 10 MG tablet Take 1 tablet (10 mg total) by mouth daily. 06/08/15   Salley Scarlet, MD  ciprofloxacin (CIPRO) 500 MG tablet Take 1 tablet (500 mg total) by mouth 2 (two) times daily. 05/21/15   Salley Scarlet, MD  cloNIDine (CATAPRES) 0.1 MG tablet Take 1 tablet (0.1 mg total) by mouth 2 (two) times daily. Patient not taking: Reported on 05/21/2015 02/17/15   Salley Scarlet, MD  hydrochlorothiazide (HYDRODIURIL) 25 MG tablet TAKE 1 TABLET BY MOUTH EVERY DAY 02/28/16   Salley Scarlet, MD  metoprolol  succinate (TOPROL-XL) 100 MG 24 hr tablet TAKE 1 TABLET BY MOUTH DAILY EITHER WITH OR IMMEDIATELY FOLLOWING A MEAL 05/17/15   Salley Scarlet, MD  promethazine (PHENERGAN) 12.5 MG tablet Take 1 tablet (12.5 mg total) by mouth every 6 (six) hours as needed for nausea or vomiting. 05/21/15   Salley Scarlet, MD    Family History Family History  Problem Relation Age of Onset  . Diabetes Mother   . Hypertension Father   . Diabetes Brother     Social  History Social History  Substance Use Topics  . Smoking status: Never Smoker  . Smokeless tobacco: Never Used  . Alcohol use Yes     Comment: 4-6 drinks per month     Allergies   Aleve [naproxen sodium]; Lisinopril; and Norvasc [amlodipine besylate]   Review of Systems Review of Systems  Constitutional: Positive for malaise/fatigue. Negative for fever.  HENT: Negative for congestion.   Respiratory: Positive for cough. Negative for shortness of breath.   Cardiovascular: Positive for chest pain. Negative for orthopnea, syncope and PND.  Gastrointestinal: Negative for abdominal pain.  Genitourinary: Negative for difficulty urinating.  Musculoskeletal: Negative for back pain.  Allergic/Immunologic: Negative for immunocompromised state.  Neurological: Negative for weakness and numbness.  Hematological: Does not bruise/bleed easily.  Psychiatric/Behavioral: Negative for confusion.  All other systems reviewed and are negative.    Physical Exam Updated Vital Signs BP (!) 226/117   Pulse 70   Temp 97.7 F (36.5 C) (Oral)   Resp (!) 25   Ht  (1.549 m)   Wt 175 lb (79.4 kg)   LMP 07/07/2016   SpO2 99%   BMI 33.07 kg/m   Physical Exam Physical Exam  Nursing note and vitals reviewed. Constitutional: Well developed, well nourished, non-toxic, and in no acute distress Head: Normocephalic and atraumatic.  Mouth/Throat: Oropharynx is clear and moist.  Neck: Normal range of motion. Neck supple.  Cardiovascular: Normal rate and regular rhythm.   Pulmonary/Chest: Effort normal and breath sounds normal.  Abdominal: Soft. There is no tenderness. There is no rebound and no guarding.  Musculoskeletal: Normal range of motion. no edema Neurological: Alert, no facial droop, fluent speech, moves all extremities symmetrically Skin: Skin is warm and dry.  Psychiatric: Cooperative   ED Treatments / Results  Labs (all labs ordered are listed, but only abnormal results are  displayed) Labs Reviewed  CBC WITH DIFFERENTIAL/PLATELET - Abnormal; Notable for the following:       Result Value   WBC 12.0 (*)    RBC 5.56 (*)    Hemoglobin 15.6 (*)    Neutro Abs 8.6 (*)    All other components within normal limits  COMPREHENSIVE METABOLIC PANEL - Abnormal; Notable for the following:    Sodium 134 (*)    Potassium 3.1 (*)    Chloride 95 (*)    Glucose, Bld 122 (*)    Total Protein 8.3 (*)    AST 66 (*)    All other components within normal limits  TROPONIN I - Abnormal; Notable for the following:    Troponin I 1.80 (*)    All other components within normal limits  APTT  PROTIME-INR  HEPARIN LEVEL (UNFRACTIONATED)  CBC    EKG  EKG Interpretation  Date/Time:  Thursday July 13 2016 17:25:38 EDT Ventricular Rate:  75 PR Interval:  130 QRS Duration: 84 QT Interval:  406 QTC Calculation: 453 R Axis:   52 Text Interpretation:  Normal sinus rhythm Normal  ECG no acute changes  Confirmed by Letta Cargile MD, Rayleigh Gillyard [phone]) on 07/13/2016 6:43:36 PM       Radiology Dg Chest 2 View  Result Date: 07/13/2016 CLINICAL DATA:  Chest congestion. EXAM: CHEST  2 VIEW COMPARISON:  01/08/2015 FINDINGS: The The lungs are clear wiithout focal pneumonia, edema, pneumothorax or pleural effusion. Cardiopericardial silhouette is at upper limits of normal for size. The visualized bony structures of the thorax are intact. IMPRESSION: No active cardiopulmonary disease. Electronically Signed   By: Kennith Center M.D.   On: 07/13/2016 18:08    Procedures Procedures (including critical care time) CRITICAL CARE Performed by: Lavera Guise  Total critical care time:  40 minutes  Critical care time was exclusive of separately billable procedures and treating other patients.  Critical care was necessary to treat or prevent imminent or life-threatening deterioration.  Critical care was time spent personally by me on the following activities: development of treatment plan with patient and/or  surrogate as well as nursing, discussions with consultants, evaluation of patient's response to treatment, examination of patient, obtaining history from patient or surrogate, ordering and performing treatments and interventions, ordering and review of laboratory studies, ordering and review of radiographic studies, pulse oximetry and re-evaluation of patient's condition.  Medications Ordered in ED Medications  heparin bolus via infusion 4,000 Units (not administered)    Followed by  heparin ADULT infusion 100 units/mL (25000 units/28mL sodium chloride 0.45%) (not administered)  aspirin tablet 325 mg (325 mg Oral Given 07/13/16 1848)  nitroGLYCERIN (NITROSTAT) SL tablet 0.4 mg (0.4 mg Sublingual Given 07/13/16 1915)     Initial Impression / Assessment and Plan / ED Course  I have reviewed the triage vital signs and the nursing notes.  Pertinent labs & imaging results that were available during my care of the patient were reviewed by me and considered in my medical decision making (see chart for details).     Intermittent chest pains over the past day. He is hypertensive in the emergency department. Is nontoxic and in no acute distress. On my evaluation she is chest pain-free. EKG is nonischemic, but she does have an elevated troponin of 1.8, concerning for ACS. Is also very hypertensive, and may be more hypertensive emergency causing heart strain. No CHF symptoms. History not suggestive of PE or dissection at this time. CXR visualized and shows no acute cardiopulmonary processes.   Given full dose aspirin and started on heparin drip. Cardiology consulted.Spoke with Dr. Burgess Estelle. Will admit to stepdown on cardiology service.   Due to ongoing elevated BPs > 190/100 and some recurrent chest pressure, will start on nitroglycerin drip.   Final Clinical Impressions(s) / ED Diagnoses   Final diagnoses:  NSTEMI (non-ST elevated myocardial infarction) [facility])    New Prescriptions New Prescriptions    No medications on file     Lavera Guise, MD 07/13/16 1934

## 2016-07-13 NOTE — H&P (Signed)
Admit date: 07/13/2016 Referring Physician: [facility] Primary Cardiologist:  New - Dr. Royann Shivers Chief complaint/reason for admission: Chest pain with elevated Trop  HPI: This is a 46yo female with a history of hyperlipidemia, HTN and OSA who presented to [facility] ER with complaints of chest pain   She apparently had a flulike illness with body aches, cough with chest congestion, fever and chills about a week ago. Yetserday evening developed left sided sharp stabbing and pressure chest discomfort with no radiation.  The pain has been intermittent from a few seconds to a few minutes at a time.  It is nonexertional and there is no pleuritic component.  It is not affected by any positions she lays in. This am she awakened with left sided jaw pain and has continued to have episodic sharp CP as well as chest pressure and associated nausea.   She denies any SOB, PND or orthopnea, LE edema, dizziness or syncope.  She has  family history of CAD with both her parents having CAD.  Her dad has his first MI in his 74's but was a smoker.  She has never smoked but was exposed to second hand smoke.  She has not been on any long trips recently.  No family history of blood clots.  In ER she was found to have an elevated trop at 1.80 > 4.57.  She was hypokalemic at 3.1.  Her total chol was 212 with LDL 127 and HDL 52.  WBC was normal. EKG was nonischemic.  She is now transferred to [facility] for further evaluation.    She denies any chest pain currently.  Her husband states that she has occasionally had chest pressure with exertion in the past but nothing like the past 24 hours. Of note her BP has not been well controlled and in ER at [facility] today it was 227/156mmHg.     PMH:    Past Medical History:  Diagnosis Date  . Allergy   . Hyperlipidemia   . Hypertension   . Migraine   . Sleep apnea     PSH:    Past Surgical History:  Procedure Laterality Date  . essure  April 2013   GYN- Dr. Algie Coffer    ALLERGIES:   Aleve  [naproxen sodium]; Lisinopril; and Norvasc [amlodipine besylate]  Prior to Admit Meds:   Prescriptions Prior to Admission  Medication Sig Dispense Refill Last Dose  . atorvastatin (LIPITOR) 10 MG tablet Take 1 tablet (10 mg total) by mouth daily. 30 tablet 3   . ciprofloxacin (CIPRO) 500 MG tablet Take 1 tablet (500 mg total) by mouth 2 (two) times daily. 14 tablet 0   . cloNIDine (CATAPRES) 0.1 MG tablet Take 1 tablet (0.1 mg total) by mouth 2 (two) times daily. (Patient not taking: Reported on 05/21/2015) 60 tablet 3 Not Taking  . hydrochlorothiazide (HYDRODIURIL) 25 MG tablet TAKE 1 TABLET BY MOUTH EVERY DAY 30 tablet 0   . metoprolol succinate (TOPROL-XL) 100 MG 24 hr tablet TAKE 1 TABLET BY MOUTH DAILY EITHER WITH OR IMMEDIATELY FOLLOWING A MEAL 90 tablet 3 Taking  . promethazine (PHENERGAN) 12.5 MG tablet Take 1 tablet (12.5 mg total) by mouth every 6 (six) hours as needed for nausea or vomiting. 30 tablet 0    Family HX:    Family History  Problem Relation Age of Onset  . Diabetes Mother   . Heart attack Mother   . Heart disease Mother   . Hypertension Father   . Heart  attack Father   . Heart disease Father   . Diabetes Brother    Social HX:    Social History   Social History  . Marital status: Married    Spouse name: N/A  . Number of children: N/A  . Years of education: N/A   Occupational History  . Not on file.   Social History Main Topics  . Smoking status: Never Smoker  . Smokeless tobacco: Never Used  . Alcohol use Yes     Comment: 4-6 drinks per month  . Drug use: No  . Sexual activity: Yes    Birth control/ protection: IUD   Other Topics Concern  . Not on file   Social History Narrative  . No narrative on file     ROS:  All ROS were addressed and are negative except what is stated in the HPI  PHYSICAL EXAM Vitals:   07/13/16 2212 07/13/16 2343  BP: (!) 154/98 (!) 183/103  Pulse: 68 66  Resp: 12 (!) 25  Temp: 97.8 F (36.6 C) 98.1 F (36.7 C)    General: Well developed, well nourished, in no acute distress Head: Eyes PERRLA, No xanthomas.   Normal cephalic and atramatic  Lungs:   Clear bilaterally to auscultation and percussion. Heart:   HRRR S1 S2 Pulses are 2+ & equal.            No carotid bruit. No JVD.  No abdominal bruits. No femoral bruits. Abdomen: Bowel sounds are positive, abdomen soft and non-tender without masses  Msk:  Back normal, normal gait. Normal strength and tone for age. Extremities:   No clubbing, cyanosis or edema.  DP +1 Neuro: Alert and oriented X 3. Psych:  Good affect, responds appropriately   Labs:   Lab Results  Component Value Date   WBC 12.0 (H) 07/13/2016   HGB 15.6 (H) 07/13/2016   HCT 44.3 07/13/2016   MCV 79.7 07/13/2016   PLT 265 07/13/2016     Recent Labs Lab 07/13/16 1733  NA 134*  K 3.1*  CL 95*  CO2 28  BUN 9  CREATININE 0.61  CALCIUM 9.3  PROT 8.3*  BILITOT 0.5  ALKPHOS 88  ALT 32  AST 66*  GLUCOSE 122*   Lab Results  Component Value Date   TROPONINI 4.57 (HH) 07/13/2016   No results found for: PTT Lab Results  Component Value Date   INR 0.93 07/13/2016     Lab Results  Component Value Date   CHOL 212 (H) 06/01/2015   CHOL 205 (H) 01/20/2014   CHOL 220 (H) 08/29/2013   Lab Results  Component Value Date   HDL 52 06/01/2015   HDL 53 01/20/2014   HDL 57 08/29/2013   Lab Results  Component Value Date   LDLCALC 127 06/01/2015   LDLCALC 125 (H) 01/20/2014   LDLCALC 140 (H) 08/29/2013   Lab Results  Component Value Date   TRIG 166 (H) 06/01/2015   TRIG 136 01/20/2014   TRIG 117 08/29/2013   Lab Results  Component Value Date   CHOLHDL 4.1 06/01/2015   CHOLHDL 3.9 01/20/2014   CHOLHDL 3.9 08/29/2013   No results found for: LDLDIRECT    Radiology:  Dg Chest 2 View  Result Date: 07/13/2016 CLINICAL DATA:  Chest congestion. EXAM: CHEST  2 VIEW COMPARISON:  01/08/2015 FINDINGS: The The lungs are clear wiithout focal pneumonia, edema,  pneumothorax or pleural effusion. Cardiopericardial silhouette is at upper limits of normal for size. The visualized bony  structures of the thorax are intact. IMPRESSION: No active cardiopulmonary disease. Electronically Signed   By: Kennith Center M.D.   On: 07/13/2016 18:08     Telemetry    NSR - Personally Reviewed  ECG    NSR with no ST changes - Personally Reviewed   ASSESSMENT/PLAN:   1.  Chest pain with typical and atypical components for coronary ischemia.  The pain is intermittent and not related to exertion.  It is sharp and stabbing but not pleuritic and there is also a pressure component to it  This am it was associated with jaw pain and nausea.  EKG is nonischemic.  She has had some intermittent CP in the past with exertion but not very frequent.  She is a nonsmoker but was exposed to second had smoke  She has a very strong family history of CAD at an early age.  She did have a viral URI about a week ago which makes acute pericarditis a possibility but there is really no pleuritic component to her pain.  Her Trop is elevated higher than expected for acute pericarditis although could consider acute myopericarditis.  She was very hypertensive on initial presentation and remains hypertensive despite IV NTG gtt and BB.   She has good distal pulses that are equal making aortic dissection less likely but still in the differential.  I will cycle enzymes to follow trend in trop and check a CPK and MB.  Check 2D echo in am to assess LVF and rule out pericardial effusion. I am going to get a stat Chest Ct angio (dissection protocol) tonight to rule out dissection.    She has not had any recent long distance travel and no family history of clotting problems.  She is not hypoxic on exam or tachycardic so unlikely to be acute PE.  She is getting a CT angio for dissection so will likely be able to assess pulmonary vasculature at that time.   Keep NPO after MN for cath in am. Will treat with ASA.   Continue IV Heparin for now as this is likely ACS and not dissection or pericarditis.  I will check CRP and sed rate. Continue BB and statin. Will continue for now but if CT scan shows significant pericardial effusion then will stop.  Cardiac catheterization was discussed with the patient fully. The patient understands that risks include but are not limited to stroke (1 in 1000), death (1 in 1000), kidney failure [usually temporary] (1 in 500), bleeding (1 in 200), allergic reaction [possibly serious] (1 in 200).  The patient understands and is willing to proceed.     2.  Viral syndrome 1 weeks ago with cough, fever and chills. WBC is normal and she is afebrile. Cxray normal.  3.  HTN - BP controlled.  Hold diuretic for possible cath in am.  Continue BB.  Likely will need another antihypertensive agent but continue IV NTG for now,    4.  Hyperlipidemia - continue statin.  Check FLP in am .    Armanda Magic, MD  07/14/2016  12:51 AM

## 2016-07-14 ENCOUNTER — Encounter (HOSPITAL_COMMUNITY): Payer: Self-pay | Admitting: Cardiology

## 2016-07-14 ENCOUNTER — Inpatient Hospital Stay (HOSPITAL_COMMUNITY): Payer: Self-pay

## 2016-07-14 ENCOUNTER — Encounter (HOSPITAL_COMMUNITY)
Admission: EM | Disposition: A | Discharge status: Home / Self Care | Payer: Self-pay | Source: Home / Self Care | Attending: Cardiovascular Disease

## 2016-07-14 ENCOUNTER — Other Ambulatory Visit (HOSPITAL_COMMUNITY): Payer: Self-pay

## 2016-07-14 DIAGNOSIS — R51 Headache: Secondary | ICD-10-CM

## 2016-07-14 DIAGNOSIS — I2510 Atherosclerotic heart disease of native coronary artery without angina pectoris: Secondary | ICD-10-CM

## 2016-07-14 DIAGNOSIS — R519 Headache, unspecified: Secondary | ICD-10-CM | POA: Insufficient documentation

## 2016-07-14 DIAGNOSIS — I214 Non-ST elevation (NSTEMI) myocardial infarction: Secondary | ICD-10-CM | POA: Diagnosis present

## 2016-07-14 DIAGNOSIS — E876 Hypokalemia: Secondary | ICD-10-CM

## 2016-07-14 HISTORY — PX: LEFT HEART CATH AND CORONARY ANGIOGRAPHY: CATH118249

## 2016-07-14 HISTORY — PX: CORONARY ANGIOPLASTY WITH STENT PLACEMENT: SHX49

## 2016-07-14 HISTORY — PX: CORONARY STENT INTERVENTION: CATH118234

## 2016-07-14 LAB — LIPID PANEL
CHOL/HDL RATIO: 4.4 ratio
CHOLESTEROL: 173 mg/dL (ref 0–200)
HDL: 39 mg/dL — ABNORMAL LOW (ref >40)
LDL Cholesterol: 85 mg/dL (ref 0–99)
TRIGLYCERIDES: 245 mg/dL — AB (ref <150)
VLDL: 49 mg/dL — ABNORMAL HIGH (ref 0–40)

## 2016-07-14 LAB — CBC WITH DIFFERENTIAL/PLATELET
BASOS ABS: 0 10*3/uL (ref 0.0–0.1)
Basophils Relative: 0 %
EOS PCT: 1 %
Eosinophils Absolute: 0.1 10*3/uL (ref 0.0–0.7)
HCT: 42.6 % (ref 36.0–46.0)
Hemoglobin: 14.6 g/dL (ref 12.0–15.0)
LYMPHS PCT: 31 %
Lymphs Abs: 3 10*3/uL (ref 0.7–4.0)
MCH: 27.3 pg (ref 26.0–34.0)
MCHC: 34.3 g/dL (ref 30.0–36.0)
MCV: 79.6 fL (ref 78.0–100.0)
MONO ABS: 0.5 10*3/uL (ref 0.1–1.0)
MONOS PCT: 5 %
Neutro Abs: 6.3 10*3/uL (ref 1.7–7.7)
Neutrophils Relative %: 63 %
PLATELETS: 283 10*3/uL (ref 150–400)
RBC: 5.35 MIL/uL — ABNORMAL HIGH (ref 3.87–5.11)
RDW: 13.2 % (ref 11.5–15.5)
WBC: 9.9 10*3/uL (ref 4.0–10.5)

## 2016-07-14 LAB — COMPREHENSIVE METABOLIC PANEL
ALT: 34 U/L (ref 14–54)
AST: 121 U/L — ABNORMAL HIGH (ref 15–41)
Albumin: 3.7 g/dL (ref 3.5–5.0)
Alkaline Phosphatase: 81 U/L (ref 38–126)
Anion gap: 13 (ref 5–15)
BUN: 6 mg/dL (ref 6–20)
CHLORIDE: 99 mmol/L — AB (ref 101–111)
CO2: 23 mmol/L (ref 22–32)
CREATININE: 0.61 mg/dL (ref 0.44–1.00)
Calcium: 9 mg/dL (ref 8.9–10.3)
Glucose, Bld: 129 mg/dL — ABNORMAL HIGH (ref 65–99)
POTASSIUM: 3.3 mmol/L — AB (ref 3.5–5.1)
Sodium: 135 mmol/L (ref 135–145)
TOTAL PROTEIN: 7.2 g/dL (ref 6.5–8.1)
Total Bilirubin: 0.5 mg/dL (ref 0.3–1.2)

## 2016-07-14 LAB — BASIC METABOLIC PANEL
ANION GAP: 15 (ref 5–15)
BUN: 5 mg/dL — ABNORMAL LOW (ref 6–20)
CO2: 24 mmol/L (ref 22–32)
Calcium: 9.3 mg/dL (ref 8.9–10.3)
Chloride: 98 mmol/L — ABNORMAL LOW (ref 101–111)
Creatinine, Ser: 0.61 mg/dL (ref 0.44–1.00)
GFR calc Af Amer: >60 mL/min (ref >60)
GFR calc non Af Amer: >60 mL/min (ref >60)
Glucose, Bld: 121 mg/dL — ABNORMAL HIGH (ref 65–99)
Potassium: 3 mmol/L — ABNORMAL LOW (ref 3.5–5.1)
Sodium: 137 mmol/L (ref 135–145)

## 2016-07-14 LAB — TSH: TSH: 1.893 u[IU]/mL (ref 0.350–4.500)

## 2016-07-14 LAB — HIV ANTIBODY (ROUTINE TESTING W REFLEX): HIV SCREEN 4TH GENERATION: NONREACTIVE

## 2016-07-14 LAB — TROPONIN I
TROPONIN I: 5.65 ng/mL — AB (ref <0.03)
TROPONIN I: 24.65 ng/mL — AB (ref <0.03)
Troponin I: 5 ng/mL (ref <0.03)

## 2016-07-14 LAB — MAGNESIUM: MAGNESIUM: 1.8 mg/dL (ref 1.7–2.4)

## 2016-07-14 LAB — HEPARIN LEVEL (UNFRACTIONATED)
Heparin Unfractionated: 0.22 IU/mL — ABNORMAL LOW (ref 0.30–0.70)
Heparin Unfractionated: 0.22 IU/mL — ABNORMAL LOW (ref 0.30–0.70)

## 2016-07-14 LAB — CBC
HCT: 43.8 % (ref 36.0–46.0)
Hemoglobin: 15.2 g/dL — ABNORMAL HIGH (ref 12.0–15.0)
MCH: 27.7 pg (ref 26.0–34.0)
MCHC: 34.7 g/dL (ref 30.0–36.0)
MCV: 79.8 fL (ref 78.0–100.0)
PLATELETS: 264 10*3/uL (ref 150–400)
RBC: 5.49 MIL/uL — ABNORMAL HIGH (ref 3.87–5.11)
RDW: 13.4 % (ref 11.5–15.5)
WBC: 12.7 10*3/uL — ABNORMAL HIGH (ref 4.0–10.5)

## 2016-07-14 LAB — MRSA PCR SCREENING: MRSA by PCR: NEGATIVE

## 2016-07-14 LAB — POCT ACTIVATED CLOTTING TIME: Activated Clotting Time: 395 seconds

## 2016-07-14 LAB — PREGNANCY, URINE: Preg Test, Ur: NEGATIVE

## 2016-07-14 SURGERY — LEFT HEART CATH AND CORONARY ANGIOGRAPHY
Anesthesia: LOCAL

## 2016-07-14 MED ORDER — TICAGRELOR 90 MG PO TABS
ORAL_TABLET | ORAL | Status: AC
  Filled 2016-07-14: qty 1

## 2016-07-14 MED ORDER — NITROGLYCERIN 1 MG/10 ML FOR IR/CATH LAB
INTRA_ARTERIAL | Status: AC
  Filled 2016-07-14: qty 10

## 2016-07-14 MED ORDER — HEPARIN (PORCINE) IN NACL 2-0.9 UNIT/ML-% IJ SOLN
INTRAMUSCULAR | Status: AC
  Filled 2016-07-14: qty 1000

## 2016-07-14 MED ORDER — IOPAMIDOL (ISOVUE-370) INJECTION 76%
INTRAVENOUS | Status: DC | PRN
  Administered 2016-07-14: 130 mL via INTRAVENOUS

## 2016-07-14 MED ORDER — TIROFIBAN (AGGRASTAT) BOLUS VIA INFUSION
INTRAVENOUS | Status: DC | PRN
  Administered 2016-07-14: 1927.5 ug via INTRAVENOUS

## 2016-07-14 MED ORDER — MORPHINE SULFATE (PF) 2 MG/ML IV SOLN
1.0000 mg | INTRAVENOUS | Status: DC | PRN
  Administered 2016-07-14: 21:00:00 1 mg via INTRAVENOUS
  Filled 2016-07-14: qty 1

## 2016-07-14 MED ORDER — MORPHINE SULFATE (PF) 2 MG/ML IV SOLN
INTRAVENOUS | Status: AC
  Administered 2016-07-14: 2 mg via INTRAVENOUS
  Filled 2016-07-14: qty 1

## 2016-07-14 MED ORDER — ATORVASTATIN CALCIUM 80 MG PO TABS
80.0000 mg | ORAL_TABLET | Freq: Every day | ORAL | Status: DC
  Administered 2016-07-14 – 2016-07-15 (×2): 80 mg via ORAL
  Filled 2016-07-14 (×2): qty 1

## 2016-07-14 MED ORDER — ASPIRIN 300 MG RE SUPP: 300.0000 mg | RECTAL | Status: DC

## 2016-07-14 MED ORDER — IOPAMIDOL (ISOVUE-370) INJECTION 76%
INTRAVENOUS | Status: AC
  Filled 2016-07-14: qty 100

## 2016-07-14 MED ORDER — HYDRALAZINE HCL 50 MG PO TABS
50.0000 mg | ORAL_TABLET | Freq: Three times a day (TID) | ORAL | Status: DC
  Administered 2016-07-14 – 2016-07-16 (×6): 50 mg via ORAL
  Filled 2016-07-14 (×6): qty 1

## 2016-07-14 MED ORDER — VERAPAMIL HCL 2.5 MG/ML IV SOLN
INTRAVENOUS | Status: AC
  Filled 2016-07-14: qty 2

## 2016-07-14 MED ORDER — METOPROLOL SUCCINATE ER 100 MG PO TB24
100.0000 mg | ORAL_TABLET | Freq: Every day | ORAL | Status: DC
  Administered 2016-07-14 – 2016-07-16 (×3): 100 mg via ORAL
  Filled 2016-07-14 (×2): qty 1
  Filled 2016-07-14: qty 2

## 2016-07-14 MED ORDER — ASPIRIN EC 81 MG PO TBEC
81.0000 mg | DELAYED_RELEASE_TABLET | Freq: Every day | ORAL | Status: DC
Start: 2016-07-15 — End: 2016-07-14

## 2016-07-14 MED ORDER — IOPAMIDOL (ISOVUE-370) INJECTION 76%
INTRAVENOUS | Status: AC
  Filled 2016-07-14: qty 50

## 2016-07-14 MED ORDER — VERAPAMIL HCL 2.5 MG/ML IV SOLN
INTRAVENOUS | Status: DC | PRN
  Administered 2016-07-14: 10 mL via INTRA_ARTERIAL

## 2016-07-14 MED ORDER — HEART ATTACK BOUNCING BOOK
Freq: Once | Status: AC
  Administered 2016-07-14: 23:00:00
  Filled 2016-07-14: qty 1

## 2016-07-14 MED ORDER — SODIUM CHLORIDE 0.9 % IV SOLN: 250.0000 mL | INTRAVENOUS | Status: DC | PRN

## 2016-07-14 MED ORDER — TICAGRELOR 90 MG PO TABS
ORAL_TABLET | ORAL | Status: DC | PRN
  Administered 2016-07-14: 180 mg via ORAL

## 2016-07-14 MED ORDER — SODIUM CHLORIDE 0.9% FLUSH: 3.0000 mL | Freq: Two times a day (BID) | INTRAVENOUS | Status: DC

## 2016-07-14 MED ORDER — IOPAMIDOL (ISOVUE-370) INJECTION 76%
INTRAVENOUS | Status: AC
  Administered 2016-07-14: 100 mL
  Filled 2016-07-14: qty 100

## 2016-07-14 MED ORDER — ONDANSETRON HCL 4 MG/2ML IJ SOLN
4.0000 mg | Freq: Four times a day (QID) | INTRAMUSCULAR | Status: DC | PRN
  Administered 2016-07-14: 4 mg via INTRAVENOUS
  Filled 2016-07-14: qty 2

## 2016-07-14 MED ORDER — NITROGLYCERIN IN D5W 200-5 MCG/ML-% IV SOLN: 0.0000 ug/min | INTRAVENOUS | Status: DC

## 2016-07-14 MED ORDER — MIDAZOLAM HCL 2 MG/2ML IJ SOLN
INTRAMUSCULAR | Status: AC
  Filled 2016-07-14: qty 2

## 2016-07-14 MED ORDER — LIDOCAINE HCL (PF) 1 % IJ SOLN
INTRAMUSCULAR | Status: AC
  Filled 2016-07-14: qty 30

## 2016-07-14 MED ORDER — HEPARIN SODIUM (PORCINE) 1000 UNIT/ML IJ SOLN
INTRAMUSCULAR | Status: DC | PRN
  Administered 2016-07-14: 5000 [IU] via INTRAVENOUS
  Administered 2016-07-14: 4000 [IU] via INTRAVENOUS

## 2016-07-14 MED ORDER — ACETAMINOPHEN 325 MG PO TABS
650.0000 mg | ORAL_TABLET | ORAL | Status: DC | PRN
  Administered 2016-07-14 (×2): 650 mg via ORAL
  Filled 2016-07-14 (×3): qty 2

## 2016-07-14 MED ORDER — TICAGRELOR 90 MG PO TABS
90.0000 mg | ORAL_TABLET | Freq: Two times a day (BID) | ORAL | Status: DC
  Administered 2016-07-14 – 2016-07-16 (×4): 90 mg via ORAL
  Filled 2016-07-14 (×4): qty 1

## 2016-07-14 MED ORDER — LIDOCAINE HCL (PF) 1 % IJ SOLN
INTRAMUSCULAR | Status: DC | PRN
  Administered 2016-07-14: 2 mL via INTRADERMAL

## 2016-07-14 MED ORDER — NITROGLYCERIN 0.4 MG SL SUBL: 0.4000 mg | SUBLINGUAL_TABLET | SUBLINGUAL | Status: DC | PRN

## 2016-07-14 MED ORDER — HEPARIN (PORCINE) IN NACL 2-0.9 UNIT/ML-% IJ SOLN
INTRAMUSCULAR | Status: DC | PRN
  Administered 2016-07-14: 1000 mL

## 2016-07-14 MED ORDER — SODIUM CHLORIDE 0.9 % IV SOLN: INTRAVENOUS | Status: DC

## 2016-07-14 MED ORDER — ASPIRIN 81 MG PO CHEW
81.0000 mg | CHEWABLE_TABLET | Freq: Every day | ORAL | Status: DC
  Administered 2016-07-15 – 2016-07-16 (×2): 81 mg via ORAL
  Filled 2016-07-14 (×2): qty 1

## 2016-07-14 MED ORDER — TIROFIBAN HCL IN NACL 5-0.9 MG/100ML-% IV SOLN
INTRAVENOUS | Status: AC
  Filled 2016-07-14: qty 100

## 2016-07-14 MED ORDER — SODIUM CHLORIDE 0.9% FLUSH
3.0000 mL | Freq: Two times a day (BID) | INTRAVENOUS | Status: DC
  Administered 2016-07-14 – 2016-07-16 (×4): 3 mL via INTRAVENOUS

## 2016-07-14 MED ORDER — HYDRALAZINE HCL 20 MG/ML IJ SOLN
10.0000 mg | Freq: Four times a day (QID) | INTRAMUSCULAR | Status: DC | PRN
  Administered 2016-07-14 – 2016-07-16 (×6): 10 mg via INTRAVENOUS
  Filled 2016-07-14 (×6): qty 1

## 2016-07-14 MED ORDER — NITROGLYCERIN 1 MG/10 ML FOR IR/CATH LAB
INTRA_ARTERIAL | Status: DC | PRN
  Administered 2016-07-14 (×3): 200 ug via INTRACORONARY

## 2016-07-14 MED ORDER — POTASSIUM CHLORIDE CRYS ER 20 MEQ PO TBCR
40.0000 meq | EXTENDED_RELEASE_TABLET | Freq: Once | ORAL | Status: AC
  Administered 2016-07-14: 40 meq via ORAL
  Filled 2016-07-14: qty 2

## 2016-07-14 MED ORDER — ALPRAZOLAM 0.5 MG PO TABS
0.5000 mg | ORAL_TABLET | Freq: Once | ORAL | Status: AC
  Administered 2016-07-14: 22:00:00 0.5 mg via ORAL
  Filled 2016-07-14: qty 1

## 2016-07-14 MED ORDER — HYDRALAZINE HCL 20 MG/ML IJ SOLN: 5.0000 mg | INTRAMUSCULAR | Status: DC | PRN

## 2016-07-14 MED ORDER — FENTANYL CITRATE (PF) 100 MCG/2ML IJ SOLN
INTRAMUSCULAR | Status: AC
  Filled 2016-07-14: qty 2

## 2016-07-14 MED ORDER — SODIUM CHLORIDE 0.9% FLUSH: 3.0000 mL | INTRAVENOUS | Status: DC | PRN

## 2016-07-14 MED ORDER — TIROFIBAN HCL IN NACL 5-0.9 MG/100ML-% IV SOLN
INTRAVENOUS | Status: DC | PRN
  Administered 2016-07-14: 0.075 ug/kg/min via INTRAVENOUS

## 2016-07-14 MED ORDER — ASPIRIN 81 MG PO CHEW
81.0000 mg | CHEWABLE_TABLET | ORAL | Status: AC
  Administered 2016-07-14: 81 mg via ORAL
  Filled 2016-07-14: qty 1

## 2016-07-14 MED ORDER — FENTANYL CITRATE (PF) 100 MCG/2ML IJ SOLN
INTRAMUSCULAR | Status: DC | PRN
  Administered 2016-07-14 (×2): 25 ug via INTRAVENOUS

## 2016-07-14 MED ORDER — TIROFIBAN HCL IN NACL 5-0.9 MG/100ML-% IV SOLN
0.1500 ug/kg/min | INTRAVENOUS | Status: DC
  Filled 2016-07-14: qty 100

## 2016-07-14 MED ORDER — SODIUM CHLORIDE 0.9 % WEIGHT BASED INFUSION
3.0000 mL/kg/h | INTRAVENOUS | Status: DC
  Administered 2016-07-14: 3 mL/kg/h via INTRAVENOUS

## 2016-07-14 MED ORDER — HEPARIN SODIUM (PORCINE) 1000 UNIT/ML IJ SOLN
INTRAMUSCULAR | Status: AC
  Filled 2016-07-14: qty 1

## 2016-07-14 MED ORDER — MIDAZOLAM HCL 2 MG/2ML IJ SOLN
INTRAMUSCULAR | Status: DC | PRN
Start: 2016-07-14 — End: 2016-07-14
  Administered 2016-07-14: 2 mg via INTRAVENOUS
  Administered 2016-07-14: 1 mg via INTRAVENOUS

## 2016-07-14 MED ORDER — ACETAMINOPHEN 325 MG PO TABS
650.0000 mg | ORAL_TABLET | ORAL | Status: DC | PRN
  Administered 2016-07-14 – 2016-07-16 (×3): 650 mg via ORAL
  Filled 2016-07-14 (×3): qty 2

## 2016-07-14 MED ORDER — MORPHINE SULFATE (PF) 4 MG/ML IV SOLN
4.0000 mg | INTRAVENOUS | Status: DC | PRN
  Administered 2016-07-14 – 2016-07-15 (×2): 4 mg via INTRAVENOUS
  Filled 2016-07-14 (×2): qty 1

## 2016-07-14 MED ORDER — SODIUM CHLORIDE 0.9 % WEIGHT BASED INFUSION: 1.0000 mL/kg/h | INTRAVENOUS | Status: DC

## 2016-07-14 MED ORDER — LABETALOL HCL 5 MG/ML IV SOLN
10.0000 mg | INTRAVENOUS | Status: DC | PRN
  Administered 2016-07-14: 16:00:00 10 mg via INTRAVENOUS
  Filled 2016-07-14: qty 4

## 2016-07-14 MED ORDER — ONDANSETRON HCL 4 MG/2ML IJ SOLN
4.0000 mg | Freq: Four times a day (QID) | INTRAMUSCULAR | Status: DC | PRN
  Filled 2016-07-14: qty 2

## 2016-07-14 MED ORDER — ASPIRIN 81 MG PO CHEW: 324.0000 mg | CHEWABLE_TABLET | ORAL | Status: DC

## 2016-07-14 MED ORDER — ANGIOPLASTY BOOK
Freq: Once | Status: AC
  Administered 2016-07-14: 23:00:00
  Filled 2016-07-14: qty 1

## 2016-07-14 SURGICAL SUPPLY — 23 items
BALLN MOZEC 2.0X12 (BALLOONS) ×2
BALLN ~~LOC~~ MOZEC 2.5X8 (BALLOONS) ×2
BALLOON MOZEC 2.0X12 (BALLOONS) IMPLANT
BALLOON ~~LOC~~ MOZEC 2.5X8 (BALLOONS) IMPLANT
CATH EXPO 5F FL3.5 (CATHETERS) ×1 IMPLANT
CATH EXPO 5FR FR4 (CATHETERS) ×1 IMPLANT
CATH LAUNCHER 6FR 3DRIGHT (CATHETERS) IMPLANT
CATHETER LAUNCHER 6FR 3DRIGHT (CATHETERS) ×2
DEVICE RAD COMP TR BAND LRG (VASCULAR PRODUCTS) ×1 IMPLANT
GLIDESHEATH SLEND SS 6F .021 (SHEATH) ×1 IMPLANT
GUIDEWIRE INQWIRE 1.5J.035X260 (WIRE) IMPLANT
INQWIRE 1.5J .035X260CM (WIRE) ×2
KIT ENCORE 26 ADVANTAGE (KITS) ×1 IMPLANT
KIT HEART LEFT (KITS) ×2 IMPLANT
PACK CARDIAC CATHETERIZATION (CUSTOM PROCEDURE TRAY) ×2 IMPLANT
STENT PROMUS PREM MR 2.25X12 (Permanent Stent) ×1 IMPLANT
TRANSDUCER W/STOPCOCK (MISCELLANEOUS) ×2 IMPLANT
TUBING CIL FLEX 10 FLL-RA (TUBING) ×2 IMPLANT
VALVE GUARDIAN II ~~LOC~~ HEMO (MISCELLANEOUS) ×1 IMPLANT
WIRE ASAHI FIELDER XT 190CM (WIRE) ×1 IMPLANT
WIRE ASAHI PROWATER 180CM (WIRE) ×1 IMPLANT
WIRE HI TORQ BMW 190CM (WIRE) ×1 IMPLANT
WIRE HI TORQ VERSACORE-J 145CM (WIRE) ×1 IMPLANT

## 2016-07-14 NOTE — Progress Notes (Signed)
TR BAND REMOVAL  LOCATION:    right radial  DEFLATED PER PROTOCOL:    Yes.    TIME BAND OFF / DRESSING APPLIED:    2130    SITE UPON ARRIVAL:    Level 2  SITE AFTER BAND REMOVAL:    Level 1  CIRCULATION SENSATION AND MOVEMENT:    Within Normal Limits   Yes.    COMMENTS:   PT TOLERATED WELL, PRESSED HEMATOMA WITH MANUAL PRESSURE, WRAPPED WITH COBAN.

## 2016-07-14 NOTE — Progress Notes (Signed)
1630 Patient reports that headache is getting better. BP  156/88 with NSR heart rate, 90's.

## 2016-07-14 NOTE — Interval H&P Note (Signed)
Cath Lab Visit (complete for each Cath Lab visit)  Clinical Evaluation Leading to the Procedure:   ACS: Yes.    Non-ACS:    Anginal Classification: CCS IV  Anti-ischemic medical therapy: Minimal Therapy (1 class of medications)  Non-Invasive Test Results: No non-invasive testing performed  Prior CABG: No previous CABG      History and Physical Interval Note:  07/14/2016 11:12 AM  Jennifer Diaz  has presented today for surgery, with the diagnosis of NSTEMI  The various methods of treatment have been discussed with the patient and family. After consideration of risks, benefits and other options for treatment, the patient has consented to  Procedure(s): Left Heart Cath and Coronary Angiography (N/A) as a surgical intervention .  The patient's history has been reviewed, patient examined, no change in status, stable for surgery.  I have reviewed the patient's chart and labs.  Questions were answered to the patient's satisfaction.     Lance Muss

## 2016-07-14 NOTE — Progress Notes (Signed)
ANTICOAGULATION CONSULT NOTE - Follow Up Consult  Pharmacy Consult for heparin Indication: chest pain/ACS  Labs:  Recent Labs  07/13/16 1733 07/13/16 1844 07/13/16 2157 07/14/16 0157  HGB 15.6*  --   --  14.6  HCT 44.3  --   --  42.6  PLT 265  --   --  283  APTT  --  33  --   --   LABPROT  --  12.5  --   --   INR  --  0.93  --   --   HEPARINUNFRC  --   --   --  0.22*  CREATININE 0.61  --   --   --   TROPONINI 1.80*  --  4.57*  --     Assessment: 45yo female subtherapeutic on heparin with initial dosing for CP; troponin rising.  Goal of Therapy:  Heparin level 0.3-0.7 units/ml   Plan:  Will increase heparin gtt by 2-3 units/kg/hr to 1000 units/hr and check level in 6hr.  Vernard Gambles, PharmD, BCPS  07/14/2016,2:58 AM

## 2016-07-14 NOTE — Progress Notes (Signed)
The patient has been seen in conjunction with Vin Bhagat, PAC. All aspects of care have been considered and discussed. The patient has been personally interviewed, examined, and all clinical data has been reviewed.   See my note done earlier today at around the same time.  All risk factors need tight control (LDL, blood pressure, A1c, etc.).  Progress Note  Patient Name: Jennifer Diaz Date of Encounter: 07/14/2016  Primary Cardiologist: New to Dr. Mayford Knife. She lives in [place] --> likely follow up there  Subjective   She is having intermittent chest pain on IV heparin and IV nitro. Complain of headache.   Inpatient Medications    Scheduled Meds: . [START ON 07/15/2016] aspirin EC  81 mg Oral Daily  . atorvastatin  80 mg Oral q1800  . metoprolol succinate  100 mg Oral Daily  . sodium chloride flush  3 mL Intravenous Q12H   Continuous Infusions: . [START ON 07/15/2016] sodium chloride 3 mL/kg/hr (07/14/16 0521)   Followed by  . [START ON 07/15/2016] sodium chloride 1 mL/kg/hr (07/14/16 1610)  . heparin 1,000 Units/hr (07/14/16 0337)  . nitroGLYCERIN 15 mcg/min (07/14/16 0243)   PRN Meds: sodium chloride, acetaminophen, hydrALAZINE, morphine injection, nitroGLYCERIN, ondansetron (ZOFRAN) IV, sodium chloride flush   Vital Signs    Vitals:   07/14/16 0328 07/14/16 0454 07/14/16 0520 07/14/16 0845  BP: (!) 173/112 (!) 167/114    Pulse: 72 63    Resp: 16 20    Temp:  98.3 F (36.8 C)  98.2 F (36.8 C)  TempSrc:  Oral  Oral  SpO2: 98% 99%    Weight:   170 lb (77.1 kg)   Height:        Intake/Output Summary (Last 24 hours) at 07/14/16 1005 Last data filed at 07/14/16 0700  Gross per 24 hour  Intake           168.74 ml  Output              775 ml  Net          -606.26 ml   Filed Weights   07/13/16 1725 07/14/16 0520  Weight: 175 lb (79.4 kg) 170 lb (77.1 kg)    Telemetry    Sinus rhythm at rate of 90s. PVCs - Personally Reviewed  ECG    NSR with TWI in  lateral leads- Personally Reviewed  Physical Exam   GEN: No acute distress.   Neck: No JVD Cardiac: RRR, no murmurs, rubs, or gallops.  Respiratory:diminished breath sound GI: Soft, nontender, non-distended  MS: No edema; No deformity. Neuro:  Nonfocal  Psych: Normal affect   Labs    Chemistry Recent Labs Lab 07/13/16 1733 07/14/16 0157  NA 134* 135  K 3.1* 3.3*  CL 95* 99*  CO2 28 23  GLUCOSE 122* 129*  BUN 9 6  CREATININE 0.61 0.61  CALCIUM 9.3 9.0  PROT 8.3* 7.2  ALBUMIN 4.4 3.7  AST 66* 121*  ALT 32 34  ALKPHOS 88 81  BILITOT 0.5 0.5  GFRNONAA >60 >60  GFRAA >60 >60  ANIONGAP 11 13     Hematology Recent Labs Lab 07/13/16 1733 07/14/16 0157 07/14/16 0905  WBC 12.0* 9.9 12.7*  RBC 5.56* 5.35* 5.49*  HGB 15.6* 14.6 15.2*  HCT 44.3 42.6 43.8  MCV 79.7 79.6 79.8  MCH 28.1 27.3 27.7  MCHC 35.2 34.3 34.7  RDW 13.3 13.2 13.4  PLT 265 283 264    Cardiac Enzymes Recent Labs Lab 07/13/16  1733 07/13/16 2157 07/14/16 0157  TROPONINI 1.80* 4.57* 5.00*   No results for input(s): TROPIPOC in the last 168 hours.   BNPNo results for input(s): BNP, PROBNP in the last 168 hours.   DDimer No results for input(s): DDIMER in the last 168 hours.   Radiology    Dg Chest 2 View  Result Date: 07/13/2016 CLINICAL DATA:  Chest congestion. EXAM: CHEST  2 VIEW COMPARISON:  01/08/2015 FINDINGS: The The lungs are clear wiithout focal pneumonia, edema, pneumothorax or pleural effusion. Cardiopericardial silhouette is at upper limits of normal for size. The visualized bony structures of the thorax are intact. IMPRESSION: No active cardiopulmonary disease. Electronically Signed   By: Kennith Center M.D.   On: 07/13/2016 18:08   Ct Angio Chest Aorta W/cm &/or Wo/cm  Result Date: 07/14/2016 CLINICAL DATA:  46 y/o  F; chest pain. EXAM: CT ANGIOGRAPHY CHEST WITH CONTRAST TECHNIQUE: Multidetector CT imaging of the chest was performed using the standard protocol during bolus  administration of intravenous contrast. Multiplanar CT image reconstructions and MIPs were obtained to evaluate the vascular anatomy. CONTRAST:  100 cc Isovue 370 COMPARISON:  None. FINDINGS: Cardiovascular: Satisfactory opacification of the pulmonary arteries to the segmental level. No evidence of pulmonary embolism. Mild cardiomegaly. Mild coronary artery calcification. Normal caliber thoracic aorta and main pulmonary artery. Mediastinum/Nodes: Right thyroid lower pole nodule measuring up to 18 mm. Normal thoracic esophagus and trachea. Lungs/Pleura: Lungs are clear. No pleural effusion or pneumothorax. Upper Abdomen: No acute abnormality. Musculoskeletal: No chest wall abnormality. No acute or significant osseous findings. Review of the MIP images confirms the above findings. IMPRESSION: 1. No pulmonary embolus identified. 2. Mild cardiomegaly. 3. Mild coronary artery calcification. 4. No acute pulmonary process identified. 5. 18 mm nodule in the lower pole of thyroid gland. Further characterization with thyroid ultrasound is recommended. Electronically Signed   By: Mitzi Hansen M.D.   On: 07/14/2016 02:00    Cardiac Studies   Pending cath and echo  Patient Profile     46 y.o. female with a history of hyperlipidemia, HTN,  OSA and recent viral illness who presented to [facility] ER with complaints of chest pain and transferred [facility] with hypertensive urgency and symptoms concerning for ACS.   Assessment & Plan    1. NSTEMI - troponin trending up (1.8-->4.57-->5.0). Intermittent chest has been despite on IV heparin and IV nitor. BP above 180s. Will give IV morphine  and IV hydralazine  now and take her to cath lab.  - No PE on CTA. ? Post viral syndrome vs pericarditis. Pending echo and cath.   2. Hypertensive urgency - BP elevated despite IV nitro. Given her headache. Continue BB--> will uptitrate post cath. She is getting IV hydralazine  q6 hours. Will give another dose prior to  cath. She will allergic to amlodipine and lisinopril--> will ask more after cath.   3. Viral syndrome one week ago. CXR without acute abnormality.  - WBC today 12.7.   4. Thyroid nodule on CTA of chest - 18mm. TSH normal. Recommended thyroid US--> follow up with PCP post discharge.   5. HLD - 07/14/2016: Cholesterol 173; HDL 39; LDL Cholesterol 85; Triglycerides 245; VLDL 49  - Continue lipitor  qd.    Signed, Bhagat,Bhavinkumar, PA  07/14/2016, 10:05 AM

## 2016-07-14 NOTE — Progress Notes (Signed)
Patient received from cath lab at 1240. Noted level 2 hematoma proximal to band 4 1/2 x 3 1/2 cms. Pressure held by Danie Binder and Nigel Mormon called from cath lab to assess need to reposition band. 1255 1 ml of air added to band by Misty Stanley. 1250 Nigel Mormon into see patient, right radial site evaluated, extremity elevated and observed with no change noted. Area soft after pressure held by [employer]. 1330 noted hematoma proximal and pressure held for 10 minutes with area soft, tenderness noted with assessment.  1435 Called cath lab and TR band sent up. Applied 2nd band proximal to site with 15 mls of air added to band.

## 2016-07-14 NOTE — Progress Notes (Signed)
Progress Note  Patient Name: Jennifer Diaz Date of Encounter: 07/14/2016  Primary Cardiologist: Croitoru  Subjective   Presented with non-ST elevation myocardial infarction. Chest pain has resolved on IV nitroglycerin but ongoing severe headache, present prior to IV nitroglycerin initiation.  Inpatient Medications    Scheduled Meds: . [START ON 07/15/2016] aspirin EC  81 mg Oral Daily  . atorvastatin  80 mg Oral q1800  . metoprolol succinate  100 mg Oral Daily  . sodium chloride flush  3 mL Intravenous Q12H   Continuous Infusions: . [START ON 07/15/2016] sodium chloride 3 mL/kg/hr (07/14/16 0521)   Followed by  . [START ON 07/15/2016] sodium chloride 1 mL/kg/hr (07/14/16 1610)  . heparin 1,000 Units/hr (07/14/16 0337)  . nitroGLYCERIN 15 mcg/min (07/14/16 0243)   PRN Meds: sodium chloride, acetaminophen, hydrALAZINE, morphine injection, nitroGLYCERIN, ondansetron (ZOFRAN) IV, sodium chloride flush   Vital Signs    Vitals:   07/14/16 0328 07/14/16 0454 07/14/16 0520 07/14/16 0845  BP: (!) 173/112 (!) 167/114    Pulse: 72 63    Resp: 16 20    Temp:  98.3 F (36.8 C)  98.2 F (36.8 C)  TempSrc:  Oral  Oral  SpO2: 98% 99%    Weight:   170 lb (77.1 kg)   Height:        Intake/Output Summary (Last 24 hours) at 07/14/16 1010 Last data filed at 07/14/16 1006  Gross per 24 hour  Intake           168.74 ml  Output             1175 ml  Net         -1006.26 ml   Filed Weights   07/13/16 1725 07/14/16 0520  Weight: 175 lb (79.4 kg) 170 lb (77.1 kg)    Telemetry    Sinus rhythm without ventricular ectopy - Personally Reviewed  ECG    ECG without acute injury pattern. Nonspecific ST-T wave abnormality. Essentially normal-appearing tracing. - Personally Reviewed  Physical Exam  In agony due to headache. GEN: No acute distress.   Neck: No JVD Cardiac: RRR, no murmurs, rubs, or gallops.  Respiratory: Clear to auscultation bilaterally. GI: Soft, nontender,  non-distended  MS: No edema; No deformity. Neuro:  Nonfocal  Psych: Normal affect   Labs    Chemistry Recent Labs Lab 07/13/16 1733 07/14/16 0157  NA 134* 135  K 3.1* 3.3*  CL 95* 99*  CO2 28 23  GLUCOSE 122* 129*  BUN 9 6  CREATININE 0.61 0.61  CALCIUM 9.3 9.0  PROT 8.3* 7.2  ALBUMIN 4.4 3.7  AST 66* 121*  ALT 32 34  ALKPHOS 88 81  BILITOT 0.5 0.5  GFRNONAA >60 >60  GFRAA >60 >60  ANIONGAP 11 13     Hematology Recent Labs Lab 07/13/16 1733 07/14/16 0157 07/14/16 0905  WBC 12.0* 9.9 12.7*  RBC 5.56* 5.35* 5.49*  HGB 15.6* 14.6 15.2*  HCT 44.3 42.6 43.8  MCV 79.7 79.6 79.8  MCH 28.1 27.3 27.7  MCHC 35.2 34.3 34.7  RDW 13.3 13.2 13.4  PLT 265 283 264    Cardiac Enzymes Recent Labs Lab 07/13/16 1733 07/13/16 2157 07/14/16 0157  TROPONINI 1.80* 4.57* 5.00*   No results for input(s): TROPIPOC in the last 168 hours.   BNPNo results for input(s): BNP, PROBNP in the last 168 hours.   DDimer No results for input(s): DDIMER in the last 168 hours.   Radiology    Dg Chest  2 View  Result Date: 07/13/2016 CLINICAL DATA:  Chest congestion. EXAM: CHEST  2 VIEW COMPARISON:  01/08/2015 FINDINGS: The The lungs are clear wiithout focal pneumonia, edema, pneumothorax or pleural effusion. Cardiopericardial silhouette is at upper limits of normal for size. The visualized bony structures of the thorax are intact. IMPRESSION: No active cardiopulmonary disease. Electronically Signed   By: Kennith Center M.D.   On: 07/13/2016 18:08   Ct Angio Chest Aorta W/cm &/or Wo/cm  Result Date: 07/14/2016 CLINICAL DATA:  46 y/o  F; chest pain. EXAM: CT ANGIOGRAPHY CHEST WITH CONTRAST TECHNIQUE: Multidetector CT imaging of the chest was performed using the standard protocol during bolus administration of intravenous contrast. Multiplanar CT image reconstructions and MIPs were obtained to evaluate the vascular anatomy. CONTRAST:  100 cc Isovue 370 COMPARISON:  None. FINDINGS:  Cardiovascular: Satisfactory opacification of the pulmonary arteries to the segmental level. No evidence of pulmonary embolism. Mild cardiomegaly. Mild coronary artery calcification. Normal caliber thoracic aorta and main pulmonary artery. Mediastinum/Nodes: Right thyroid lower pole nodule measuring up to 18 mm. Normal thoracic esophagus and trachea. Lungs/Pleura: Lungs are clear. No pleural effusion or pneumothorax. Upper Abdomen: No acute abnormality. Musculoskeletal: No chest wall abnormality. No acute or significant osseous findings. Review of the MIP images confirms the above findings. IMPRESSION: 1. No pulmonary embolus identified. 2. Mild cardiomegaly. 3. Mild coronary artery calcification. 4. No acute pulmonary process identified. 5. 18 mm nodule in the lower pole of thyroid gland. Further characterization with thyroid ultrasound is recommended. Electronically Signed   By: Mitzi Hansen M.D.   On: 07/14/2016 02:00    Cardiac Studies   CT of the chest demonstrated coronary calcification but no evidence of PE.  Patient Profile     46 y.o. female presents with chest pain that includes atypical features, elevated troponin, recent flulike illness, and chest CT demonstrating coronary calcification. Presentation is that of non-ST elevation MI. Myocarditis/pericarditis is also in the differential.  Headache is severe, with present prior to admission to the hospital, and she has a history of recurring headaches.  Assessment & Plan    1. Presumed non-ST elevation myocardial infarction given family history of CAD, coronary calcification on CT, and elevated markers. Plan is for coronary angiography and possible PCI today.  2. Hyperlipidemia. Aggressive LDL lowering will be necessary.  3. May need workup for headache. Currently, I believe it is being made worse by IV nitroglycerin.  4. Severe hypokalemia. Will attempt to further replete potassium level prior to cath.   The patient was  counseled to undergo left heart catheterization, coronary angiography, and possible percutaneous coronary intervention with stent implantation. The procedural risks and benefits were discussed in detail. The risks discussed included death, stroke, myocardial infarction, life-threatening bleeding, limb ischemia, kidney injury, allergy, and possible emergency cardiac surgery. The risk of these significant complications were estimated to occur less than 1% of the time. After discussion, the patient has agreed to proceed.  Signed, Lesleigh Noe, MD  07/14/2016, 10:10 AM

## 2016-07-14 NOTE — Progress Notes (Signed)
Patient complained of headache earlier and given tylenol with relief. Called again with headache, given labetalol IV for elevated blood pressure and heart rate will reassess headache, blood pressure and heart rate in 1/2 hour.

## 2016-07-14 NOTE — Care Management Note (Addendum)
Case Management Note  Patient Details  Name: Jennifer Diaz MRN: [id number] Date of Birth: [birth year]  Subjective/Objective:   From home with spouse, s/p coronary stent intervention, will be on brilinta.  She has no insurance, NCM gave patient the 30 day savings card for Brilinta, informed spouse to go to [facility] on [address] to pick up the 30 day brilinta. Also patient would like to get apt at the [facility] clinic, NCM called tried to make apt , patient will need to call on Tuesday 4/9 at 8:30 to schedule a hospital follow up apt, so she can be followed up with the pass program for the brilinta.  NCM gave patient the patient assistance application to take to hospital follow up apt at the [facility] clinic. Once she finishes the 30 day free and she needs another refill while waiting on patient assistance she would be able to get the refill for $10 at the [facility] clinic.                 Action/Plan:   Expected Discharge Date:                  Expected Discharge Plan:  Home/Self Care  In-House Referral:     Discharge planning Services  CM Consult, Medication Assistance, Indigent Health Clinic  Post Acute Care Choice:    Choice offered to:     DME Arranged:    DME Agency:     HH Arranged:    HH Agency:     Status of Service:  Completed, signed off  If discussed at [employer] of Stay Meetings, dates discussed:    Additional Comments:  Leone Haven, RN 07/14/2016, 2:35 PM

## 2016-07-14 NOTE — H&P (View-Only) (Signed)
 Progress Note  Patient Name: Jennifer Diaz Date of Encounter: 07/14/2016  Primary Cardiologist: Croitoru  Subjective   Presented with non-ST elevation myocardial infarction. Chest pain has resolved on IV nitroglycerin but ongoing severe headache, present prior to IV nitroglycerin initiation.  Inpatient Medications    Scheduled Meds: . [START ON 07/15/2016] aspirin EC  81 mg Oral Daily  . atorvastatin  80 mg Oral q1800  . metoprolol succinate  100 mg Oral Daily  . sodium chloride flush  3 mL Intravenous Q12H   Continuous Infusions: . [START ON 07/15/2016] sodium chloride 3 mL/kg/hr (07/14/16 0521)   Followed by  . [START ON 07/15/2016] sodium chloride 1 mL/kg/hr (07/14/16 0638)  . heparin 1,000 Units/hr (07/14/16 0337)  . nitroGLYCERIN 15 mcg/min (07/14/16 0243)   PRN Meds: sodium chloride, acetaminophen, hydrALAZINE, morphine injection, nitroGLYCERIN, ondansetron (ZOFRAN) IV, sodium chloride flush   Vital Signs    Vitals:   07/14/16 0328 07/14/16 0454 07/14/16 0520 07/14/16 0845  BP: (!) 173/112 (!) 167/114    Pulse: 72 63    Resp: 16 20    Temp:  98.3 F (36.8 C)  98.2 F (36.8 C)  TempSrc:  Oral  Oral  SpO2: 98% 99%    Weight:   170 lb (77.1 kg)   Height:        Intake/Output Summary (Last 24 hours) at 07/14/16 1010 Last data filed at 07/14/16 1006  Gross per 24 hour  Intake           168.74 ml  Output             1175 ml  Net         -1006.26 ml   Filed Weights   07/13/16 1725 07/14/16 0520  Weight: 175 lb (79.4 kg) 170 lb (77.1 kg)    Telemetry    Sinus rhythm without ventricular ectopy - Personally Reviewed  ECG    ECG without acute injury pattern. Nonspecific ST-T wave abnormality. Essentially normal-appearing tracing. - Personally Reviewed  Physical Exam  In agony due to headache. GEN: No acute distress.   Neck: No JVD Cardiac: RRR, no murmurs, rubs, or gallops.  Respiratory: Clear to auscultation bilaterally. GI: Soft, nontender,  non-distended  MS: No edema; No deformity. Neuro:  Nonfocal  Psych: Normal affect   Labs    Chemistry Recent Labs Lab 07/13/16 1733 07/14/16 0157  NA 134* 135  K 3.1* 3.3*  CL 95* 99*  CO2 28 23  GLUCOSE 122* 129*  BUN 9 6  CREATININE 0.61 0.61  CALCIUM 9.3 9.0  PROT 8.3* 7.2  ALBUMIN 4.4 3.7  AST 66* 121*  ALT 32 34  ALKPHOS 88 81  BILITOT 0.5 0.5  GFRNONAA >60 >60  GFRAA >60 >60  ANIONGAP 11 13     Hematology Recent Labs Lab 07/13/16 1733 07/14/16 0157 07/14/16 0905  WBC 12.0* 9.9 12.7*  RBC 5.56* 5.35* 5.49*  HGB 15.6* 14.6 15.2*  HCT 44.3 42.6 43.8  MCV 79.7 79.6 79.8  MCH 28.1 27.3 27.7  MCHC 35.2 34.3 34.7  RDW 13.3 13.2 13.4  PLT 265 283 264    Cardiac Enzymes Recent Labs Lab 07/13/16 1733 07/13/16 2157 07/14/16 0157  TROPONINI 1.80* 4.57* 5.00*   No results for input(s): TROPIPOC in the last 168 hours.   BNPNo results for input(s): BNP, PROBNP in the last 168 hours.   DDimer No results for input(s): DDIMER in the last 168 hours.   Radiology    Dg Chest   2 View  Result Date: 07/13/2016 CLINICAL DATA:  Chest congestion. EXAM: CHEST  2 VIEW COMPARISON:  01/08/2015 FINDINGS: The The lungs are clear wiithout focal pneumonia, edema, pneumothorax or pleural effusion. Cardiopericardial silhouette is at upper limits of normal for size. The visualized bony structures of the thorax are intact. IMPRESSION: No active cardiopulmonary disease. Electronically Signed   By: Kennith Center M.D.   On: 07/13/2016 18:08   Ct Angio Chest Aorta W/cm &/or Wo/cm  Result Date: 07/14/2016 CLINICAL DATA:  46 y/o  F; chest pain. EXAM: CT ANGIOGRAPHY CHEST WITH CONTRAST TECHNIQUE: Multidetector CT imaging of the chest was performed using the standard protocol during bolus administration of intravenous contrast. Multiplanar CT image reconstructions and MIPs were obtained to evaluate the vascular anatomy. CONTRAST:  100 cc Isovue 370 COMPARISON:  None. FINDINGS:  Cardiovascular: Satisfactory opacification of the pulmonary arteries to the segmental level. No evidence of pulmonary embolism. Mild cardiomegaly. Mild coronary artery calcification. Normal caliber thoracic aorta and main pulmonary artery. Mediastinum/Nodes: Right thyroid lower pole nodule measuring up to 18 mm. Normal thoracic esophagus and trachea. Lungs/Pleura: Lungs are clear. No pleural effusion or pneumothorax. Upper Abdomen: No acute abnormality. Musculoskeletal: No chest wall abnormality. No acute or significant osseous findings. Review of the MIP images confirms the above findings. IMPRESSION: 1. No pulmonary embolus identified. 2. Mild cardiomegaly. 3. Mild coronary artery calcification. 4. No acute pulmonary process identified. 5. 18 mm nodule in the lower pole of thyroid gland. Further characterization with thyroid ultrasound is recommended. Electronically Signed   By: Mitzi Hansen M.D.   On: 07/14/2016 02:00    Cardiac Studies   CT of the chest demonstrated coronary calcification but no evidence of PE.  Patient Profile     46 y.o. female presents with chest pain that includes atypical features, elevated troponin, recent flulike illness, and chest CT demonstrating coronary calcification. Presentation is that of non-ST elevation MI. Myocarditis/pericarditis is also in the differential.  Headache is severe, with present prior to admission to the hospital, and she has a history of recurring headaches.  Assessment & Plan    1. Presumed non-ST elevation myocardial infarction given family history of CAD, coronary calcification on CT, and elevated markers. Plan is for coronary angiography and possible PCI today.  2. Hyperlipidemia. Aggressive LDL lowering will be necessary.  3. May need workup for headache. Currently, I believe it is being made worse by IV nitroglycerin.  4. Severe hypokalemia. Will attempt to further replete potassium level prior to cath.   The patient was  counseled to undergo left heart catheterization, coronary angiography, and possible percutaneous coronary intervention with stent implantation. The procedural risks and benefits were discussed in detail. The risks discussed included death, stroke, myocardial infarction, life-threatening bleeding, limb ischemia, kidney injury, allergy, and possible emergency cardiac surgery. The risk of these significant complications were estimated to occur less than 1% of the time. After discussion, the patient has agreed to proceed.  Signed, Lesleigh Noe, MD  07/14/2016, 10:10 AM

## 2016-07-15 ENCOUNTER — Inpatient Hospital Stay (HOSPITAL_COMMUNITY): Payer: Self-pay

## 2016-07-15 DIAGNOSIS — R079 Chest pain, unspecified: Secondary | ICD-10-CM

## 2016-07-15 LAB — ECHOCARDIOGRAM COMPLETE
HEIGHTINCHES: 61 in
Weight: 2860.69 oz

## 2016-07-15 LAB — CBC
HEMATOCRIT: 41.5 % (ref 36.0–46.0)
HEMOGLOBIN: 14.1 g/dL (ref 12.0–15.0)
MCH: 27.3 pg (ref 26.0–34.0)
MCHC: 34 g/dL (ref 30.0–36.0)
MCV: 80.3 fL (ref 78.0–100.0)
Platelets: 298 10*3/uL (ref 150–400)
RBC: 5.17 MIL/uL — AB (ref 3.87–5.11)
RDW: 13.9 % (ref 11.5–15.5)
WBC: 14.6 10*3/uL — ABNORMAL HIGH (ref 4.0–10.5)

## 2016-07-15 LAB — BASIC METABOLIC PANEL
Anion gap: 12 (ref 5–15)
BUN: 6 mg/dL (ref 6–20)
CALCIUM: 9 mg/dL (ref 8.9–10.3)
CO2: 22 mmol/L (ref 22–32)
Chloride: 102 mmol/L (ref 101–111)
Creatinine, Ser: 0.69 mg/dL (ref 0.44–1.00)
GFR calc Af Amer: >60 mL/min (ref >60)
GLUCOSE: 110 mg/dL — AB (ref 65–99)
Potassium: 3.4 mmol/L — ABNORMAL LOW (ref 3.5–5.1)
Sodium: 136 mmol/L (ref 135–145)

## 2016-07-15 LAB — TROPONIN I: Troponin I: 7.33 ng/mL (ref <0.03)

## 2016-07-15 LAB — HEMOGLOBIN A1C
Hgb A1c MFr Bld: 6 % — ABNORMAL HIGH (ref 4.8–5.6)
Mean Plasma Glucose: 126 mg/dL

## 2016-07-15 MED ORDER — LOSARTAN POTASSIUM 50 MG PO TABS
50.0000 mg | ORAL_TABLET | Freq: Every day | ORAL | Status: DC
  Administered 2016-07-15 – 2016-07-16 (×2): 50 mg via ORAL
  Filled 2016-07-15 (×2): qty 1

## 2016-07-15 MED ORDER — POTASSIUM CHLORIDE CRYS ER 20 MEQ PO TBCR
40.0000 meq | EXTENDED_RELEASE_TABLET | Freq: Once | ORAL | Status: AC
  Administered 2016-07-15: 10:00:00 40 meq via ORAL
  Filled 2016-07-15: qty 2

## 2016-07-15 NOTE — Progress Notes (Signed)
Pt c/o headache since at least 1900. Reports tylenol or morphine is not helping. Paged on call cardiology, patient given  morphine IVP, and 0.5mg  po xanax, reassessing patient states her pain is a zero.

## 2016-07-15 NOTE — Progress Notes (Signed)
Ace wrap removed, no swelling observed, radial pulse +2, site is warm and soft, mild discoloration from bruising noted, arm still elevated above heart level will continue to monitor.

## 2016-07-15 NOTE — Progress Notes (Signed)
CARDIAC REHAB PHASE I   PRE:  Rate/Rhythm: 87 SR    BP: sitting 153/90    SaO2: 98 RA  MODE:  Ambulation: 200 ft   POST:  Rate/Rhythm: 110 ST    BP: sitting 181/99, 163/93 after 25 min rest     SaO2: 98  Ra  Pt tired and still with HA this am, 3/10. Slow pace, couldn't walk far. HA did not increase, sts she just feels tired. BP elevated after walking. Ed completed with pt and husband. Voiced understanding regarding Brilinta, diet, ex, MI, stent, NTG, and CRPII. Will refer to [facility] CRPII however she will need financial assist. Also discussed maintenance program if no financial coverage. [id number]  Harriet Masson CES, ACSM 07/15/2016 8:55 AM

## 2016-07-15 NOTE — Progress Notes (Signed)
Coban reapplied to hematoma site for 2 hours, removed. Level 0. Pt reports tenderness, area is soft and without discoloration.

## 2016-07-15 NOTE — Progress Notes (Signed)
  Called by RN. R radial site with hematoma.  Pulse intact.  Hand is warm. Patient notes pain. PLAN: 1. Apply pressure dressing x 1 hour. 2. Call back if hematoma still present/worse after dressing taken off. Tereso Newcomer, PA-C    07/15/2016 2:45 PM

## 2016-07-15 NOTE — Progress Notes (Signed)
Progress Note  Patient Name: Jennifer Diaz Date of Encounter: 07/15/2016  Primary Cardiologist: Croitoru  Subjective   Complains of HA; no dyspnea or chest pain  Inpatient Medications    Scheduled Meds: . aspirin  81 mg Oral Daily  . atorvastatin  80 mg Oral q1800  . hydrALAZINE  50 mg Oral TID  . metoprolol succinate  100 mg Oral Daily  . sodium chloride flush  3 mL Intravenous Q12H  . ticagrelor  90 mg Oral BID   Continuous Infusions:  PRN Meds: sodium chloride, acetaminophen, hydrALAZINE, morphine injection, nitroGLYCERIN, ondansetron (ZOFRAN) IV, sodium chloride flush   Vital Signs    Vitals:   07/15/16 0221 07/15/16 0400 07/15/16 0632 07/15/16 0819  BP: (!) 145/82 (!) 163/92 (!) 162/104 (!) 153/90  Pulse: 78 85  81  Resp: 16 (!) 26  12  Temp: 98.1 F (36.7 C)   97.9 F (36.6 C)  TempSrc: Oral   Oral  SpO2: 99% 96%  90%  Weight: 178 lb 12.7 oz (81.1 kg)     Height:        Intake/Output Summary (Last 24 hours) at 07/15/16 0837 Last data filed at 07/15/16 0800  Gross per 24 hour  Intake          1614.99 ml  Output             1200 ml  Net           414.99 ml   Filed Weights   07/14/16 0520 07/14/16 1255 07/15/16 0221  Weight: 170 lb (77.1 kg) 169 lb 15.6 oz (77.1 kg) 178 lb 12.7 oz (81.1 kg)    Telemetry    Sinus rhythm- Personally Reviewed   Physical Exam   GEN: No acute distress.   Neck: No JVD Cardiac: RRR, no murmurs, rubs, or gallops.  Respiratory: Clear to auscultation bilaterally. GI: Soft, nontender, non-distended  MS: No edema; cath site with small hematoma Neuro:  Nonfocal  Psych: Normal affect   Labs    Chemistry  Recent Labs Lab 07/13/16 1733 07/14/16 0157 07/14/16 0905 07/15/16 0232  NA 134* 135 137 136  K 3.1* 3.3* 3.0* 3.4*  CL 95* 99* 98* 102  CO2 GLUCOSE 122* 129* 121* 110*  BUN 9 6 5* 6  CREATININE 0.61 0.61 0.61 0.69  CALCIUM 9.3 9.0 9.3 9.0  PROT 8.3* 7.2  --   --   ALBUMIN 4.4 3.7  --   --    AST 66* 121*  --   --   ALT 32 34  --   --   ALKPHOS 88 81  --   --   BILITOT 0.5 0.5  --   --   GFRNONAA >60 >60 >60 >60  GFRAA >60 >60 >60 >60  ANIONGAP Hematology  Recent Labs Lab 07/14/16 0157 07/14/16 0905 07/15/16 0232  WBC 9.9 12.7* 14.6*  RBC 5.35* 5.49* 5.17*  HGB 14.6 15.2* 14.1  HCT 42.6 43.8 41.5  MCV 79.6 79.8 80.3  MCH 27.3 27.7 27.3  MCHC 34.3 34.7 34.0  RDW 13.2 13.4 13.9  PLT 283 264 298    Cardiac Enzymes  Recent Labs Lab 07/14/16 0157 07/14/16 0905 07/14/16 1342 07/15/16 0327  TROPONINI 5.00* 5.65* 24.65* 7.33*     Radiology    Dg Chest 2 View  Result Date: 07/13/2016 CLINICAL DATA:  Chest congestion. EXAM: CHEST  2 VIEW COMPARISON:  01/08/2015  FINDINGS: The The lungs are clear wiithout focal pneumonia, edema, pneumothorax or pleural effusion. Cardiopericardial silhouette is at upper limits of normal for size. The visualized bony structures of the thorax are intact. IMPRESSION: No active cardiopulmonary disease. Electronically Signed   By: Kennith Center M.D.   On: 07/13/2016 18:08   Ct Angio Chest Aorta W/cm &/or Wo/cm  Result Date: 07/14/2016 CLINICAL DATA:  46 y/o  F; chest pain. EXAM: CT ANGIOGRAPHY CHEST WITH CONTRAST TECHNIQUE: Multidetector CT imaging of the chest was performed using the standard protocol during bolus administration of intravenous contrast. Multiplanar CT image reconstructions and MIPs were obtained to evaluate the vascular anatomy. CONTRAST:  100 cc Isovue 370 COMPARISON:  None. FINDINGS: Cardiovascular: Satisfactory opacification of the pulmonary arteries to the segmental level. No evidence of pulmonary embolism. Mild cardiomegaly. Mild coronary artery calcification. Normal caliber thoracic aorta and main pulmonary artery. Mediastinum/Nodes: Right thyroid lower pole nodule measuring up to 18 mm. Normal thoracic esophagus and trachea. Lungs/Pleura: Lungs are clear. No pleural effusion or pneumothorax. Upper  Abdomen: No acute abnormality. Musculoskeletal: No chest wall abnormality. No acute or significant osseous findings. Review of the MIP images confirms the above findings. IMPRESSION: 1. No pulmonary embolus identified. 2. Mild cardiomegaly. 3. Mild coronary artery calcification. 4. No acute pulmonary process identified. 5. 18 mm nodule in the lower pole of thyroid gland. Further characterization with thyroid ultrasound is recommended. Electronically Signed   By: Mitzi Hansen M.D.   On: 07/14/2016 02:00    Cardiac Studies   CT of the chest demonstrated coronary calcification but no evidence of PE. Thyroid nodule noted  Patient Profile     46 y.o. female presents with chest pain and elevated troponin, recent flulike illness, and chest CT demonstrating coronary calcification. Presentation is that of non-ST elevation MI. Myocarditis/pericarditis is also in the differential.  Assessment & Plan    1. Non-ST elevation myocardial infarction-S/P PCI Of obtuse marginal. Continue aspirin, brilinta, statin and toprol. Await echocardiogram for LV function and to rule out pericardial effusion.  2. Hyperlipidemia-continue statin. Check lipids and liver in 4 weeks.  3. Headache-patient continues with headache. May have been secondary to nitroglycerin. Does have some headaches at home. We will follow and may need further workup if it persists.  4. Hypokalemia-supplement  5 hypertension-blood pressure remains elevated. Add Cozaar 50 mg daily. Patient had cough with ACE inhibitor previously. She had rash with Norvasc.   6 Thyroid nodule-noted on CT scan. She will need outpatient ultrasound following discharge.   Transfer to telemetry. Possible discharge tomorrow morning if stable.   Signed, Olga Millers, MD  07/15/2016, 8:37 AM

## 2016-07-15 NOTE — Progress Notes (Signed)
Coban removed from hematoma site. Hematoma level 1. Will continue to reassess.

## 2016-07-15 NOTE — Progress Notes (Signed)
  Echocardiogram 2D Echocardiogram has been performed.  Jennifer Diaz 07/15/2016, 12:04 PM

## 2016-07-15 NOTE — Progress Notes (Signed)
Ace wrap applied to right radial site, arm elevated above the heart level, will take dressing off in one hour and reassess as ordered.

## 2016-07-15 NOTE — Progress Notes (Addendum)
Patient arrived the unit on a wheelchair from [facility], assessment completed see flowsheet, right radial site at level 1 with mild discoloration noted, placed on tele ccmd notified, bed in lowest position, call bell within reach will continue to monitor

## 2016-07-16 ENCOUNTER — Other Ambulatory Visit: Payer: Self-pay | Admitting: Family Medicine

## 2016-07-16 ENCOUNTER — Encounter (HOSPITAL_COMMUNITY): Payer: Self-pay | Admitting: Cardiology

## 2016-07-16 DIAGNOSIS — E041 Nontoxic single thyroid nodule: Secondary | ICD-10-CM

## 2016-07-16 DIAGNOSIS — Z95820 Peripheral vascular angioplasty status with implants and grafts: Secondary | ICD-10-CM

## 2016-07-16 DIAGNOSIS — I2510 Atherosclerotic heart disease of native coronary artery without angina pectoris: Secondary | ICD-10-CM

## 2016-07-16 HISTORY — DX: Peripheral vascular angioplasty status with implants and grafts: Z95.820

## 2016-07-16 HISTORY — DX: Nontoxic single thyroid nodule: E04.1

## 2016-07-16 HISTORY — DX: Atherosclerotic heart disease of native coronary artery without angina pectoris: I25.10

## 2016-07-16 LAB — BASIC METABOLIC PANEL
Anion gap: 8 (ref 5–15)
BUN: 12 mg/dL (ref 6–20)
CALCIUM: 8.9 mg/dL (ref 8.9–10.3)
CO2: 22 mmol/L (ref 22–32)
Chloride: 105 mmol/L (ref 101–111)
Creatinine, Ser: 0.69 mg/dL (ref 0.44–1.00)
GFR calc non Af Amer: >60 mL/min (ref >60)
Glucose, Bld: 116 mg/dL — ABNORMAL HIGH (ref 65–99)
Potassium: 3.9 mmol/L (ref 3.5–5.1)
SODIUM: 135 mmol/L (ref 135–145)

## 2016-07-16 LAB — CBC
HCT: 41.9 % (ref 36.0–46.0)
Hemoglobin: 14.1 g/dL (ref 12.0–15.0)
MCH: 27.2 pg (ref 26.0–34.0)
MCHC: 33.7 g/dL (ref 30.0–36.0)
MCV: 80.9 fL (ref 78.0–100.0)
Platelets: 319 10*3/uL (ref 150–400)
RBC: 5.18 MIL/uL — ABNORMAL HIGH (ref 3.87–5.11)
RDW: 13.9 % (ref 11.5–15.5)
WBC: 13.5 10*3/uL — ABNORMAL HIGH (ref 4.0–10.5)

## 2016-07-16 MED ORDER — ACETAMINOPHEN 325 MG PO TABS: 650.0000 mg | ORAL_TABLET | ORAL | Status: DC | PRN

## 2016-07-16 MED ORDER — TICAGRELOR 90 MG PO TABS: 90.0000 mg | ORAL_TABLET | Freq: Two times a day (BID) | ORAL | 0 refills | Status: DC

## 2016-07-16 MED ORDER — LOSARTAN POTASSIUM 50 MG PO TABS: 50.0000 mg | ORAL_TABLET | Freq: Every day | ORAL | 6 refills | Status: DC

## 2016-07-16 MED ORDER — ASPIRIN 81 MG PO CHEW: 81.0000 mg | CHEWABLE_TABLET | Freq: Every day | ORAL | Status: AC

## 2016-07-16 MED ORDER — ATORVASTATIN CALCIUM 80 MG PO TABS: 80.0000 mg | ORAL_TABLET | Freq: Every day | ORAL | 6 refills | Status: DC

## 2016-07-16 MED ORDER — TICAGRELOR 90 MG PO TABS: 90.0000 mg | ORAL_TABLET | Freq: Two times a day (BID) | ORAL | 4 refills | Status: DC

## 2016-07-16 MED ORDER — NITROGLYCERIN 0.4 MG SL SUBL: 0.4000 mg | SUBLINGUAL_TABLET | SUBLINGUAL | 4 refills | Status: DC | PRN

## 2016-07-16 MED ORDER — HYDRALAZINE HCL 50 MG PO TABS: 50.0000 mg | ORAL_TABLET | Freq: Three times a day (TID) | ORAL | 6 refills | Status: DC

## 2016-07-16 NOTE — Discharge Instructions (Signed)
Call [facility] at [phone] if any bleeding, swelling or drainage at cath site.  May shower, no tub baths for 48 hours for groin sticks. No lifting over 5 pounds for 5 days.  No Driving for 5 days  Now work until seen in the office  Take 1 NTG, under your tongue, while sitting.  If no relief of pain may repeat NTG, one tab every 5 minutes up to 3 tablets total over 15 minutes.  If no relief CALL 911.  If you have dizziness/lightheadness  while taking NTG, stop taking and call 911.        Heart Healthy Diet.   DO NOT stop Brilinta and Asprin, these are to keep your heart vessel open, call if any problems.

## 2016-07-16 NOTE — Discharge Summary (Signed)
Discharge Summary    Patient ID: Jennifer Diaz,  MRN: [id number], DOB/AGE: Nov 04, 1970 46 y.o.  Admit date: 07/13/2016 Discharge date: 07/16/2016  Primary Care Provider: Milinda Antis Primary Cardiologist: Dr. Royann Shivers  Discharge Diagnoses    Principal Problem:   NSTEMI (non-ST elevated myocardial infarction) [facility]) Active Problems:   Hyperlipidemia   Essential hypertension   Migraine headache   Chest pain   Hypokalemia   Thyroid nodule   CAD in native artery   S/P angioplasty with stent 07/14/16 with DES to OM2   Allergies Allergies  Allergen Reactions  . Aleve [Naproxen Sodium] Swelling and Other (See Comments)    Blisters to hand and mouth  . Lisinopril Cough  . Norvasc [Amlodipine Besylate] Rash    Diagnostic Studies/Procedures    CARDIAC CATH 07/14/16 by Dr. Eldridge Dace  Procedures   Coronary Stent Intervention  Left Heart Cath and Coronary Angiography  Conclusion     Mid RCA lesion, 10 %stenosed.  Dist Cx lesion, 10 %stenosed.  Mid LAD lesion, 10 %stenosed.  2nd Mrg lesion, 100 %stenosed. THis was the culprit for her NSTEMI.  A STENT PROMUS PREM MR 2.25X12 drug eluting stent was successfully placed, postdilated to 2.5 mm.  Post intervention, there is a 0% residual stenosis.  The left ventricular systolic function is normal.  LV end diastolic pressure is mildly elevated.  The left ventricular ejection fraction is 55-65% by visual estimate.  There is no aortic valve stenosis.  LVEDP 22 mm Hg.   Continue aggressive secondary prevention.  Plan for DAPT for 12 months without interruption.  Chest pain that she had upon arrival to cath lab has resolved.     ECHO 07/15/16 Study Conclusions  - Left ventricle: The cavity size was normal. There was mild   concentric hypertrophy. Systolic function was normal. The   estimated ejection fraction was in the range of 60% to 65%. Wall   motion was normal; there were no regional wall motion  abnormalities. Left ventricular diastolic function parameters   were normal. - Aortic valve: There was no regurgitation. - Aortic root: The aortic root was normal in size. - Mitral valve: There was mild regurgitation. - Left atrium: The atrium was normal in size. - Right ventricle: The cavity size was normal. Wall thickness was   normal. Systolic function was normal. - Right atrium: The atrium was normal in size. - Tricuspid valve: There was trivial regurgitation. - Pulmonary arteries: Systolic pressure was within the normal   range. - Inferior vena cava: The vessel was normal in size. - Pericardium, extracardiac: There was no pericardial effusion.  _____________   History of Present Illness     46yo female with a history of hyperlipidemia, HTN and OSA who presented to [facility] with complaints of chest pain   She apparently had a flulike illness with body aches, cough with chest congestion, fever and chills about a week ago. 07/12/16 evening developed left sided sharp stabbing and pressure chest discomfort with no radiation.  The pain has been intermittent from a few seconds to a few minutes at a time.  It is nonexertional and there is no pleuritic component.  It is not affected by any positions she lays in. Am of 07/14/16  she awakened with left sided jaw pain and continued to have episodic sharp CP as well as chest pressure and associated nausea.   She denied any SOB, PND or orthopnea, LE edema, dizziness or syncope.  She has  family history of  CAD with both her parents having CAD.  Her dad has his first MI in his 74's but was a smoker.  She has never smoked but was exposed to second hand smoke.    In ER she was found to have an elevated trop at 1.80 > 4.57.  She was hypokalemic at 3.1.  Her total chol was 212 with LDL 127 and HDL 52.  WBC was normal. EKG was nonischemic.  She is now transferred to [facility] for further evaluation.  She was placed on IV heparin and ASA given and plans for cardiac  cath.  Hospital Course     Consultants: none   Troponin pked at 24.65.  She underwent cardiac cath and found 2nd marginal 100% stenosed and underwent DES stent.  Promus Prem.  EF 55-65% LVEDP 22 mmHg.  Plan for DAPT for 12 months.  The next day she had echo and also complained of headache.  BP remained elevated.     By 07/16/16 pt was seen and evaluated by Dr. Jens Som and found stable for discharge.  Her H/A has resolved no chest pain -though some mild dyspnea earlier in day.  This may be Brilinta but will continue for now. But if dyspnea persists then may need to change to Plavix.    Echo with normal LV function. No effusion.   It is noted pt had thyroid nodule and will need ultrasound as outpt.  _____________  Discharge Vitals Blood pressure (!) 122/55, pulse 80, temperature 98.1 F (36.7 C), temperature source Oral, resp. rate 19, height  (1.549 m), weight 178 lb 12.7 oz (81.1 kg), last menstrual period 07/07/2016, SpO2 95 %.  Filed Weights   07/14/16 0520 07/14/16 1255 07/15/16 0221  Weight: 170 lb (77.1 kg) 169 lb 15.6 oz (77.1 kg) 178 lb 12.7 oz (81.1 kg)    Labs & Radiologic Studies    CBC  Recent Labs  07/13/16 1733 07/14/16 0157  07/15/16 0232 07/16/16 0232  WBC 12.0* 9.9  < > 14.6* 13.5*  NEUTROABS 8.6* 6.3  --   --   --   HGB 15.6* 14.6  < > 14.1 14.1  HCT 44.3 42.6  < > 41.5 41.9  MCV 79.7 79.6  < > 80.3 80.9  PLT 265 283  < > 298 319  < > = values in this interval not displayed. Basic Metabolic Panel  Recent Labs  07/14/16 0157  07/15/16 0232 07/16/16 0232  NA 135  < > 136 135  K 3.3*  < > 3.4* 3.9  CL 99*  < > 102 105  CO2 23  < > 22 22  GLUCOSE 129*  < > 110* 116*  BUN 6  < > 6 12  CREATININE 0.61  < > 0.69 0.69  CALCIUM 9.0  < > 9.0 8.9  MG 1.8  --   --   --   < > = values in this interval not displayed. Liver Function Tests  Recent Labs  07/13/16 1733 07/14/16 0157  AST 66* 121*  ALT 32 34  ALKPHOS 88 81  BILITOT 0.5 0.5  PROT 8.3*  7.2  ALBUMIN 4.4 3.7   No results for input(s): LIPASE, AMYLASE in the last 72 hours. Cardiac Enzymes  Recent Labs  07/14/16 0905 07/14/16 1342 07/15/16 0327  TROPONINI 5.65* 24.65* 7.33*   BNP Invalid input(s): POCBNP D-Dimer No results for input(s): DDIMER in the last 72 hours. Hemoglobin A1C  Recent Labs  07/14/16 0157  HGBA1C 6.0*  Fasting Lipid Panel  Recent Labs  07/14/16 0157  CHOL 173  HDL 39*  LDLCALC 85  TRIG 161*  CHOLHDL 4.4   Thyroid Function Tests  Recent Labs  07/14/16 0157  TSH 1.893   _____________  Dg Chest 2 View  Result Date: 07/13/2016 CLINICAL DATA:  Chest congestion. EXAM: CHEST  2 VIEW COMPARISON:  01/08/2015 FINDINGS: The The lungs are clear wiithout focal pneumonia, edema, pneumothorax or pleural effusion. Cardiopericardial silhouette is at upper limits of normal for size. The visualized bony structures of the thorax are intact. IMPRESSION: No active cardiopulmonary disease. Electronically Signed   By: Kennith Center M.D.   On: 07/13/2016 18:08   Ct Angio Chest Aorta W/cm &/or Wo/cm  Result Date: 07/14/2016 CLINICAL DATA:  46 y/o  F; chest pain. EXAM: CT ANGIOGRAPHY CHEST WITH CONTRAST TECHNIQUE: Multidetector CT imaging of the chest was performed using the standard protocol during bolus administration of intravenous contrast. Multiplanar CT image reconstructions and MIPs were obtained to evaluate the vascular anatomy. CONTRAST:  100 cc Isovue 370 COMPARISON:  None. FINDINGS: Cardiovascular: Satisfactory opacification of the pulmonary arteries to the segmental level. No evidence of pulmonary embolism. Mild cardiomegaly. Mild coronary artery calcification. Normal caliber thoracic aorta and main pulmonary artery. Mediastinum/Nodes: Right thyroid lower pole nodule measuring up to 18 mm. Normal thoracic esophagus and trachea. Lungs/Pleura: Lungs are clear. No pleural effusion or pneumothorax. Upper Abdomen: No acute abnormality. Musculoskeletal: No  chest wall abnormality. No acute or significant osseous findings. Review of the MIP images confirms the above findings. IMPRESSION: 1. No pulmonary embolus identified. 2. Mild cardiomegaly. 3. Mild coronary artery calcification. 4. No acute pulmonary process identified. 5. 18 mm nodule in the lower pole of thyroid gland. Further characterization with thyroid ultrasound is recommended. Electronically Signed   By: Mitzi Hansen M.D.   On: 07/14/2016 02:00   Disposition   Pt is being discharged home today in good condition.  Follow-up Plans & Appointments   Call [facility] at [phone] if any bleeding, swelling or drainage at cath site.  May shower, no tub baths for 48 hours for groin sticks. No lifting over 5 pounds for 5 days.  No Driving for 5 days  Now work until seen in the office  Take 1 NTG, under your tongue, while sitting.  If no relief of pain may repeat NTG, one tab every 5 minutes up to 3 tablets total over 15 minutes.  If no relief CALL 911.  If you have dizziness/lightheadness  while taking NTG, stop taking and call 911.        Heart Healthy Diet.   DO NOT stop Brilinta and Asprin, these are to keep your heart vessel open, call if any problems.      Follow-up Information    [facility] Follow up.   Why:  please call on Tuesday 4/9 at 8:30 am to make hospital follow up apt, you will be on Brinlinta which you will need patient assist for ,because of no insurance.  will need to be set up with the Pass program in their pharmacy. Contact information: [address] [place] [address] [phone]       Thurmon Fair, MD Follow up.   Specialty:  Cardiology Why:  you will be called this week for appt this week.   Contact information: [address] [place] [address] [phone]          Discharge Instructions  Amb Referral to Cardiac Rehabilitation    Complete  by:  As directed    Diagnosis:   Coronary Stents NSTEMI PTCA        Discharge Medications   Current Discharge Medication List    START taking these medications   Details  acetaminophen (TYLENOL) 325 MG tablet Take 2 tablets (650 mg total) by mouth every 4 (four) hours as needed for headache or mild pain.    aspirin 81 MG chewable tablet Chew 1 tablet (81 mg total) by mouth daily.    hydrALAZINE (APRESOLINE) 50 MG tablet Take 1 tablet (50 mg total) by mouth 3 (three) times daily. Qty: 90 tablet, Refills: 6    losartan (COZAAR) 50 MG tablet Take 1 tablet (50 mg total) by mouth daily. Qty: 30 tablet, Refills: 6    nitroGLYCERIN (NITROSTAT) 0.4 MG SL tablet Place 1 tablet (0.4 mg total) under the tongue every 5 (five) minutes x 3 doses as needed for chest pain. Qty: 25 tablet, Refills: 4    ticagrelor (BRILINTA) 90 MG TABS tablet Take 1 tablet (90 mg total) by mouth 2 (two) times daily. Qty: 180 tablet, Refills: 4      CONTINUE these medications which have CHANGED   Details  atorvastatin (LIPITOR) 80 MG tablet Take 1 tablet (80 mg total) by mouth daily at 6 PM. Qty: 30 tablet, Refills: 6      CONTINUE these medications which have NOT CHANGED   Details  metoprolol succinate (TOPROL-XL) 100 MG 24 hr tablet TAKE 1 TABLET BY MOUTH DAILY EITHER WITH OR IMMEDIATELY FOLLOWING A MEAL Qty: 90 tablet, Refills: 3      STOP taking these medications     hydrochlorothiazide (HYDRODIURIL) 25 MG tablet          Aspirin prescribed at discharge?  Yes High Intensity Statin Prescribed? (Lipitor 40-80mg  or Crestor 20-40mg ): Yes Beta Blocker Prescribed? Yes For EF <40%, was ACEI/ARB Prescribed? No: na ADP Receptor Inhibitor Prescribed? (i.e. Plavix etc.-Includes Medically Managed Patients): Yes For EF <40%, Aldosterone Inhibitor Prescribed? No: na Was EF assessed during THIS hospitalization? Yes Was Cardiac Rehab II ordered? (Included Medically managed Patients): Yes   Outstanding  Labs/Studies   She will need outpatient ultrasound following discharge In 4 weeks needs hepatic and lipid panel.    Duration of Discharge Encounter   Greater than 30 minutes including physician time.  Signed, Nada Boozer NP 07/16/2016, 12:31 PM

## 2016-07-16 NOTE — Progress Notes (Signed)
Discharge instructions given. Pt verbalized understanding and all questions were answered.  

## 2016-07-16 NOTE — Progress Notes (Addendum)
Progress Note  Patient Name: Jennifer Diaz Date of Encounter: 07/16/2016  Primary Cardiologist: Croitoru  Subjective   HA resolved; no chest pain; dyspnea earlier  Inpatient Medications    Scheduled Meds: . aspirin  81 mg Oral Daily  . atorvastatin  80 mg Oral q1800  . hydrALAZINE  50 mg Oral TID  . losartan  50 mg Oral Daily  . metoprolol succinate  100 mg Oral Daily  . sodium chloride flush  3 mL Intravenous Q12H  . ticagrelor  90 mg Oral BID   Continuous Infusions:  PRN Meds: sodium chloride, acetaminophen, hydrALAZINE, morphine injection, nitroGLYCERIN, ondansetron (ZOFRAN) IV, sodium chloride flush   Vital Signs    Vitals:   07/15/16 1449 07/15/16 2052 07/16/16 0358 07/16/16 0501  BP: (!) 146/78 (!) 154/90 (!) 161/108 (!) 122/55  Pulse: 69 79 80   Resp:  18 19   Temp:  98.1 F (36.7 C)    TempSrc:  Oral    SpO2:  94% 95%   Weight:      Height:        Intake/Output Summary (Last 24 hours) at 07/16/16 1040 Last data filed at 07/16/16 0830  Gross per 24 hour  Intake              600 ml  Output                0 ml  Net              600 ml   Filed Weights   07/14/16 0520 07/14/16 1255 07/15/16 0221  Weight: 170 lb (77.1 kg) 169 lb 15.6 oz (77.1 kg) 178 lb 12.7 oz (81.1 kg)    Telemetry    Sinus rhythm- Personally Reviewed   Physical Exam   GEN: No acute distress.   Neck: No JVD Cardiac: RRR, no murmurs, rubs, or gallops.  Respiratory: Clear to auscultation bilaterally. GI: Soft, nontender, non-distended  MS: No edema; cath site with small hematoma and mild ecchymosis Neuro:  Nonfocal  Psych: Normal affect   Labs    Chemistry  Recent Labs Lab 07/13/16 1733 07/14/16 0157 07/14/16 0905 07/15/16 0232 07/16/16 0232  NA 134* 135 137 136 135  K 3.1* 3.3* 3.0* 3.4* 3.9  CL 95* 99* 98* 102 105  CO2 GLUCOSE 122* 129* 121* 110* 116*  BUN 9 6 5* 6 12  CREATININE 0.61 0.61 0.61 0.69 0.69  CALCIUM 9.3 9.0 9.3 9.0 8.9  PROT  8.3* 7.2  --   --   --   ALBUMIN 4.4 3.7  --   --   --   AST 66* 121*  --   --   --   ALT 32 34  --   --   --   ALKPHOS 88 81  --   --   --   BILITOT 0.5 0.5  --   --   --   GFRNONAA >60 >60 >60 >60 >60  GFRAA >60 >60 >60 >60 >60  ANIONGAP Hematology  Recent Labs Lab 07/14/16 0905 07/15/16 0232 07/16/16 0232  WBC 12.7* 14.6* 13.5*  RBC 5.49* 5.17* 5.18*  HGB 15.2* 14.1 14.1  HCT 43.8 41.5 41.9  MCV 79.8 80.3 80.9  MCH 27.7 27.3 27.2  MCHC 34.7 34.0 33.7  RDW 13.4 13.9 13.9  PLT 264 298 319    Cardiac Enzymes  Recent Labs Lab 07/14/16  0157 07/14/16 0905 07/14/16 1342 07/15/16 0327  TROPONINI 5.00* 5.65* 24.65* 7.33*       Cardiac Studies   CT of the chest demonstrated coronary calcification but no evidence of PE. Thyroid nodule noted  Patient Profile     46 y.o. female presents with chest pain and elevated troponin, recent flulike illness, and chest CT demonstrating coronary calcification. Presentation is that of non-ST elevation MI.   Assessment & Plan    1. Non-ST elevation myocardial infarction-S/P PCI Of obtuse marginal. Continue aspirin, brilinta, statin and toprol. Echo showed normal LV function and no effusion. Pt complains of some dyspnea. May be related to brilinta; if persists as outpt change to plavix.  2. Hyperlipidemia-continue statin. Check lipids and liver in 4 weeks.  3. Headache-improved and none at present; does have some headaches at home; likely exacerbated here by NTG.   4. Hypokalemia-improved  5 hypertension-blood pressure remains elevated. Continue Cozaar 50 mg daily and advance as outpt if needed. Patient had cough with ACE inhibitor previously. She had rash with Norvasc.   6 Thyroid nodule-noted on CT scan. She will need outpatient ultrasound following discharge.   DC today with TOC appt one week; fu with Dr Royann Shivers 12 weeks > 30 min PA and physician time D2  Signed, Olga Millers, MD  07/16/2016, 10:40  AM

## 2016-07-20 ENCOUNTER — Ambulatory Visit: Payer: Self-pay | Attending: Internal Medicine | Admitting: Physician Assistant

## 2016-07-20 ENCOUNTER — Encounter: Payer: Self-pay | Admitting: Physician Assistant

## 2016-07-20 VITALS — BP 169/105 | HR 71 | Temp 98.2°F | Resp 16 | Wt 172.6 lb

## 2016-07-20 DIAGNOSIS — F419 Anxiety disorder, unspecified: Secondary | ICD-10-CM | POA: Insufficient documentation

## 2016-07-20 DIAGNOSIS — I214 Non-ST elevation (NSTEMI) myocardial infarction: Secondary | ICD-10-CM | POA: Insufficient documentation

## 2016-07-20 DIAGNOSIS — G4733 Obstructive sleep apnea (adult) (pediatric): Secondary | ICD-10-CM | POA: Insufficient documentation

## 2016-07-20 DIAGNOSIS — E876 Hypokalemia: Secondary | ICD-10-CM | POA: Insufficient documentation

## 2016-07-20 DIAGNOSIS — I10 Essential (primary) hypertension: Secondary | ICD-10-CM | POA: Insufficient documentation

## 2016-07-20 DIAGNOSIS — Z886 Allergy status to analgesic agent status: Secondary | ICD-10-CM | POA: Insufficient documentation

## 2016-07-20 DIAGNOSIS — E785 Hyperlipidemia, unspecified: Secondary | ICD-10-CM | POA: Insufficient documentation

## 2016-07-20 DIAGNOSIS — R739 Hyperglycemia, unspecified: Secondary | ICD-10-CM | POA: Insufficient documentation

## 2016-07-20 DIAGNOSIS — Z955 Presence of coronary angioplasty implant and graft: Secondary | ICD-10-CM | POA: Insufficient documentation

## 2016-07-20 DIAGNOSIS — Z888 Allergy status to other drugs, medicaments and biological substances status: Secondary | ICD-10-CM | POA: Insufficient documentation

## 2016-07-20 DIAGNOSIS — Z79899 Other long term (current) drug therapy: Secondary | ICD-10-CM | POA: Insufficient documentation

## 2016-07-20 DIAGNOSIS — Z7982 Long term (current) use of aspirin: Secondary | ICD-10-CM | POA: Insufficient documentation

## 2016-07-20 DIAGNOSIS — I2510 Atherosclerotic heart disease of native coronary artery without angina pectoris: Secondary | ICD-10-CM | POA: Insufficient documentation

## 2016-07-20 DIAGNOSIS — Z8249 Family history of ischemic heart disease and other diseases of the circulatory system: Secondary | ICD-10-CM | POA: Insufficient documentation

## 2016-07-20 DIAGNOSIS — E041 Nontoxic single thyroid nodule: Secondary | ICD-10-CM | POA: Insufficient documentation

## 2016-07-20 LAB — POCT GLYCOSYLATED HEMOGLOBIN (HGB A1C): Hemoglobin A1C: 5.8

## 2016-07-20 MED ORDER — ALPRAZOLAM 0.25 MG PO TABS: 0.2500 mg | ORAL_TABLET | Freq: Two times a day (BID) | ORAL | 0 refills | Status: DC | PRN

## 2016-07-20 MED ORDER — LOSARTAN POTASSIUM 100 MG PO TABS: 100.0000 mg | ORAL_TABLET | Freq: Every day | ORAL | 3 refills | Status: DC

## 2016-07-20 MED FILL — LOSARTAN POTASSIUM 100 MG T: 100 | 30 days supply | Qty: 30 | Fill #0

## 2016-07-20 NOTE — Patient Instructions (Signed)
Chest Wall Pain Chest wall pain is pain in or around the bones and muscles of your chest. Sometimes, an injury causes this pain. Sometimes, the cause may not be known. This pain may take several weeks or longer to get better. Follow these instructions at home: Pay attention to any changes in your symptoms. Take these actions to help with your pain:  Rest as told by your doctor.  Avoid activities that cause pain. Try not to use your chest, belly (abdominal), or side muscles to lift heavy things.  If directed, apply ice to the painful area:  Put ice in a plastic bag.  Place a towel between your skin and the bag.  Leave the ice on for 20 minutes, 2-3 times per day.  Take over-the-counter and prescription medicines only as told by your doctor.  Do not use tobacco products, including cigarettes, chewing tobacco, and e-cigarettes. If you need help quitting, ask your doctor.  Keep all follow-up visits as told by your doctor. This is important. Contact a doctor if:  You have a fever.  Your chest pain gets worse.  You have new symptoms. Get help right away if:  You feel sick to your stomach (nauseous) or you throw up (vomit).  You feel sweaty or light-headed.  You have a cough with phlegm (sputum) or you cough up blood.  You are short of breath. This information is not intended to replace advice given to you by your health care provider. Make sure you discuss any questions you have with your health care provider. Document Released: 09/13/2007 Document Revised: 09/02/2015 Document Reviewed: 06/22/2014 Elsevier Interactive Patient Education  2017 [employer]  

## 2016-07-20 NOTE — Progress Notes (Signed)
Patient ID: Jennifer Diaz, female   DOB: 1970-09-18, 46 y.o.   MRN: [id number]    Markela Wee, is a 46 y.o. female  OZH:[id number]  GEX:[id number]  DOB - 06/17/1970  Subjective:  Chief Complaint and HPI: Jennifer Diaz is a 46 y.o. female here today to establish care and for a follow up visit Being in the hospital 07/13/2016 for CP and NSTEMI.  She presented to the ED about a week after having a febrile illness with L sided chest pain that started to radiate in to her L arm and jaw.  Today she presents feeling overall better.  She has not had any CP since hospitalization.  She has had a couple of episodes where she felt as though she couldn't catch her breath but they resolved quickly.  She feels flushed for a few minutes after taking Brillinta.  She feels tired but otherwise ok.  She was on metoprolol and HCTZ prior to hospitalization but it never controlled her BP.  She was discharged from the hospital on Hydralazine  tid, metoprolol  daily, and Losartan  daily.  Her BP have still been uncontrolled.  She brought readings from home 170-190/96-118.  She sees cardiology on Monday, April 16th,2018.  Sh e had a mild HA on Monday that was relieved with Tylenol; no HA since.  Per hospital note in chart: 46 yr old with a history of hyperlipidemia, HTN and OSA who presented to [facility] with complaints of chest pain She apparently had a flulike illness with body aches, cough with chest congestion, fever and chills about a week ago. 07/12/16 evening developed left sided sharp stabbing and pressure chest discomfort with no radiation. The pain has been intermittent from a few seconds to a few minutes at a time. It is nonexertional and there is no pleuritic component. It is not affected by any positions she lays in.Am of 07/14/16  she awakened with left sided jaw pain and continued to have episodic sharp CP as well as chest pressure and associated nausea. She denied any SOB, PND or orthopnea, LE  edema, dizziness or syncope. She has family history of CAD with both her parents having CAD. Her dad has his first MI in his 42's but was a smoker. She has never smoked but was exposed to second hand smoke.   In ER she was found to have an elevated trop at 1.80 >4.57. She was hypokalemic at 3.1. Her total chol was 212 with LDL 127 and HDL 52. WBC was normal. EKG was nonischemic. She is now transferred to [facility] for further evaluation. She was placed on IV heparin and ASA given and plans for cardiac cath.  CARDIAC CATH 07/14/16 by Dr. Eldridge Dace  Procedures   Coronary Stent Intervention  Left Heart Cath and Coronary Angiography  Conclusion     Mid RCA lesion, 10 %stenosed.  Dist Cx lesion, 10 %stenosed.  Mid LAD lesion, 10 %stenosed.  2nd Mrg lesion, 100 %stenosed. THis was the culprit for her NSTEMI.  A STENT PROMUS PREM MR 2.25X12 drug eluting stent was successfully placed, postdilated to 2.5 mm.  Post intervention, there is a 0% residual stenosis.  The left ventricular systolic function is normal.  LV end diastolic pressure is mildly elevated.  The left ventricular ejection fraction is 55-65% by visual estimate.  There is no aortic valve stenosis.  LVEDP 22 mm Hg.  Continue aggressive secondary prevention. Plan for DAPT for 12 months without interruption. Chest pain that she had upon arrival to cath  lab has resolved.     ECHO 07/15/16 Study Conclusions  - Left ventricle: The cavity size was normal. There was mild concentric hypertrophy. Systolic function was normal. The estimated ejection fraction was in the range of 60% to 65%. Wall motion was normal; there were no regional wall motion abnormalities. Left ventricular diastolic function parameterswere normal. - Aortic valve: There was no regurgitation. - Aortic root: The aortic root was normal in size. - Mitral valve: There was mild regurgitation. - Left atrium: The atrium was normal in  size. - Right ventricle: The cavity size was normal. Wall thickness was normal. Systolic function was normal. - Right atrium: The atrium was normal in size. - Tricuspid valve: There was trivial regurgitation. - Pulmonary arteries: Systolic pressure was within the normal range. - Inferior vena cava: The vessel was normal in size. - Pericardium, extracardiac: There was no pericardial effusion   Troponin pked at 24.65.  She underwent cardiac cath and found 2nd marginal 100% stenosed and underwent DES stent.  Promus Prem.  EF 55-65% LVEDP 22 mmHg.  Plan for DAPT for 12 months.  The next day she had echo and also complained of headache.  BP remained elevated.     By 07/16/16 pt was seen and evaluated by Dr. Jens Som and found stable for discharge.  Her H/A has resolved no chest pain -though some mild dyspnea earlier in day.  This may be Brilinta but will continue for now. But if dyspnea persists then may need to change to Plavix.    Echo with normal LV function. No effusion.   It is noted pt had thyroid nodule and will need ultrasound as outpt.  She is experiencing some anxiety which is unusual for her.  She is fearful of having another event.  ED/Hospital notes reviewed.   Social History:  married Family history:  Dad and mom with heart disease at young age; experienced 2nd hand smoke  ROS:   Constitutional:  No f/c, No night sweats, No unexplained weight loss. EENT:  No vision changes, No blurry vision, No hearing changes. No mouth, throat, or ear problems.  Respiratory: No cough, No SOB Cardiac: No CP, no palpitations GI:  No abd pain, No N/V/D. GU: No Urinary s/sx Musculoskeletal: No joint pain Neuro: No headache, no dizziness, no motor weakness.  Skin: No rash Endocrine:  No polydipsia. No polyuria.  Psych: Denies SI/HI  No problems updated.  ALLERGIES: Allergies  Allergen Reactions  . Aleve [Naproxen Sodium] Swelling and Other (See Comments)    Blisters to hand and mouth   . Lisinopril Cough  . Norvasc [Amlodipine Besylate] Rash    PAST MEDICAL HISTORY: Past Medical History:  Diagnosis Date  . Allergy   . CAD in native artery 07/16/2016  . Coronary artery disease   . Hyperlipidemia   . Hypertension   . Migraine    "4-5/week" (07/14/2016)  . NSTEMI (non-ST elevated myocardial infarction) (HCC) 07/13/2016  . S/P angioplasty with stent 07/14/16 with DES to OM2 07/16/2016  . Sleep apnea   . Thyroid nodule 07/16/2016    MEDICATIONS AT HOME: Prior to Admission medications   Medication Sig Start Date End Date Taking? Authorizing Provider  acetaminophen (TYLENOL) 325 MG tablet Take 2 tablets (650 mg total) by mouth every 4 (four) hours as needed for headache or mild pain. Patient not taking: Reported on 07/20/2016 07/16/16   Leone Brand, NP  ALPRAZolam Prudy Feeler) 0.25 MG tablet Take 1 tablet (0.25 mg total) by mouth 2 (two)  times daily as needed for anxiety. 07/20/16   Anders Simmonds, PA-C  aspirin 81 MG chewable tablet Chew 1 tablet (81 mg total) by mouth daily. 07/17/16   Leone Brand, NP  atorvastatin (LIPITOR) 80 MG tablet Take 1 tablet (80 mg total) by mouth daily at 6 PM. 07/16/16   Leone Brand, NP  hydrALAZINE (APRESOLINE) 50 MG tablet Take 1 tablet (50 mg total) by mouth 3 (three) times daily. 07/16/16   Leone Brand, NP  losartan (COZAAR) 100 MG tablet Take 1 tablet (100 mg total) by mouth daily. 07/20/16   Anders Simmonds, PA-C  metoprolol succinate (TOPROL-XL) 100 MG 24 hr tablet TAKE 1 TABLET BY MOUTH DAILY EITHER WITH OR IMMEDIATELY FOLLOWING A MEAL 07/17/16   Salley Scarlet, MD  nitroGLYCERIN (NITROSTAT) 0.4 MG SL tablet Place 1 tablet (0.4 mg total) under the tongue every 5 (five) minutes x 3 doses as needed for chest pain. 07/16/16   Leone Brand, NP  ticagrelor (BRILINTA) 90 MG TABS tablet Take 1 tablet (90 mg total) by mouth 2 (two) times daily. 07/16/16   Leone Brand, NP     Objective:  EXAM:   Vitals:   07/20/16 1119  BP: (!) 169/105   Pulse: 71  Resp: 16  Temp: 98.2 F (36.8 C)  TempSrc: Oral  SpO2: 96%  Weight: 172 lb 9.6 oz (78.3 kg)    General appearance : A&OX3. NAD. Non-toxic-appearing HEENT: Atraumatic and Normocephalic.  PERRLA. EOM intact.  TM clear B. Mouth-MMM, post pharynx WNL w/o erythema, No PND. Neck: supple, no JVD. No cervical lymphadenopathy. No thyromegaly.  No bruit Chest/Lungs:  Breathing-non-labored, Good air entry bilaterally, breath sounds normal without rales, rhonchi, or wheezing  CVS: S1 S2 regular, no murmurs, gallops, rubs  Extremities: Bilateral Lower Ext shows no edema, both legs are warm to touch with = pulse throughout.  Large ecchymosis R volar forearm.  Normal pulses Neurology:  CN II-XII grossly intact, Non focal.   Psych:  TP linear. J/I WNL. Normal speech. Appropriate eye contact and affect.  Skin:  No Rash  Data Review Lab Results  Component Value Date   HGBA1C 5.8 07/20/2016   HGBA1C 6.0 (H) 07/14/2016     Assessment & Plan   1. Hyperglycemia - POCT glycosylated hemoglobin (Hb A1C) I have had a lengthy discussion and provided education about insulin resistance and the intake of too much sugar/refined carbohydrates.  I have advised the patient to work at a goal of eliminating sugary drinks, candy, desserts, sweets, refined sugars, processed foods, and white carbohydrates.  The patient expresses understanding.    2. Hypertension, unspecified type Uncontrolled.  Continue Hydralazine  tid, continue metoprolol  daily Increase dose- losartan (COZAAR) 100 MG tablet; Take 1 tablet (100 mg total) by mouth daily.  Dispense: 90 tablet; Refill: 3 Check BP OOO 2 times daily and record and take to cardiology appt as well as bringing to f/up here  3. NSTEMI (non-ST elevated myocardial infarction) [facility]) See cardiology Monday.  Continue with Brillinta, aspirin, Lipitor, NTG(if needed)  4. Anxiety Use sparingly-doubt she will take- - ALPRAZolam (XANAX) 0.25 MG tablet;  Take 1 tablet (0.25 mg total) by mouth 2 (two) times daily as needed for anxiety.  Dispense: 20 tablet; Refill: 0     Patient have been counseled extensively about nutrition and exercise  Return in about 3 weeks (around 08/10/2016) for assign PCP; f/up BP, NSTEMI.  The patient was given clear instructions to go  to ER or return to medical center if symptoms don't improve, worsen or new problems develop. The patient verbalized understanding. The patient was told to call to get lab results if they haven't heard anything in the next week.     Georgian Co, PA-C [facility] [place], [place] [phone]   07/20/2016, 11:48 AM

## 2016-07-24 ENCOUNTER — Ambulatory Visit (INDEPENDENT_AMBULATORY_CARE_PROVIDER_SITE_OTHER): Payer: Self-pay | Admitting: Physician Assistant

## 2016-07-24 ENCOUNTER — Encounter: Payer: Self-pay | Admitting: Physician Assistant

## 2016-07-24 VITALS — BP 153/102 | HR 76 | Ht 61.0 in | Wt 173.0 lb

## 2016-07-24 DIAGNOSIS — I10 Essential (primary) hypertension: Secondary | ICD-10-CM

## 2016-07-24 DIAGNOSIS — Z79899 Other long term (current) drug therapy: Secondary | ICD-10-CM

## 2016-07-24 DIAGNOSIS — E785 Hyperlipidemia, unspecified: Secondary | ICD-10-CM

## 2016-07-24 DIAGNOSIS — I214 Non-ST elevation (NSTEMI) myocardial infarction: Secondary | ICD-10-CM

## 2016-07-24 MED ORDER — HYDRALAZINE HCL 50 MG PO TABS: 75.0000 mg | ORAL_TABLET | Freq: Three times a day (TID) | ORAL | 6 refills | Status: DC

## 2016-07-24 MED FILL — hydrALAZINE HCL 50 MG TABS: 50 | 30 days supply | Qty: 135 | Fill #0

## 2016-07-24 NOTE — Patient Instructions (Signed)
Medication Instructions:  INCREASE HYDRALAZINE TO  THREE TIMES DAILY  If you need a refill on your cardiac medications before your next appointment, please call your pharmacy.  Labwork: FASTING LIPIDS AND CMP IN 3 MONTHS AT [facility] ON THE 1ST FLOOR-OK AT PCP PLEASE FAX RESULTS TO [phone]  Follow-Up: Your physician wants you to follow-up in: WITH DR CRENSHAW   Special Instructions: IF SHORTNESS OF BREATH DOES NOT IMPROVE PLEASE CALL us AND LET us KNOW  DRINK 4 OUNCES WHEN TAKING BRILINTA-TO SEE IF THIS HELPS WITH SOB    Thank you for choosing [facility] at [place]!!    RHONDA BARRETT, Franchot Gallo, LPN

## 2016-07-24 NOTE — Progress Notes (Signed)
Cardiology Office Note   Date:  07/24/2016   ID:  Jennifer Diaz, DOB 08/20/1970, MRN [id number]  PCP:  Vic Blackbird, MD  Cardiologist:  Dr Stanford Breed (Dr. Sallyanne Kuster was listed but she never met him.) Dr. Stanford Breed saw her in the hospital [facility] Teuscher, [place], [place]   Chief Complaint  Patient presents with  . Follow-up    History of Present Illness: Jennifer Diaz is a 46 y.o. female with a history of HLD, HTN, OSA.  Admit 04/05-04/08 w/ NSTEMI>>s/p DES OM2, had hematoma R wrist  Jennifer Diaz presents for post-hospital follow up  She still sometimes gets the SOB. She has to get up at night and sit up. It will go away in an hour. She gets this in the morning, after she takes the Brilinta as well as after the evening dose of Brilinta.  She gets headaches, most days. It starts in the back of her head and comes over the front. Her BP has been somewhat improved, but is still above target. She had one reading of 138/82, other readings are higher. Chest since leaving the hospital, one reading was as high as 193/116.  She has had some fleeting chest pain, irritable go away when she takes a couple of deep breaths. She did not try nitroglycerin, she did not feel it was the same as her anginal pain. It went away very quickly.  She has not had lower extremity edema. She has not had chronic orthopnea, when the shortness of breath that she gets after the Brilinta goes away, she is able to sleep the rest of the night through without any problems. She has not had any chest pain with exertion.  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 They want to know if it is okay for her to go back to work, if it is okay for her to drive, and if it is okay for her to start using her wrist morning. She was to know if she can start cooking again.    Past Medical History:  Diagnosis Date  . Allergy   . CAD in native artery 07/16/2016  . Coronary artery disease   . Hyperlipidemia   . Hypertension   . Migraine    "4-5/week" (07/14/2016)  . NSTEMI (non-ST elevated myocardial infarction) ([place]) 07/13/2016  . S/P angioplasty with stent 07/14/16 with DES to OM2 07/16/2016  . Sleep apnea   . Thyroid nodule 07/16/2016    Past Surgical History:  Procedure Laterality Date  . CORONARY ANGIOPLASTY WITH STENT PLACEMENT  07/14/2016  . CORONARY STENT INTERVENTION N/A 07/14/2016   Procedure: Coronary Stent Intervention;  Surgeon: Jettie Booze, MD;  Location: [place] [facility];  Service: Cardiovascular;  Laterality: N/A;  . essure  April 2013   GYN- Dr. Valentino Saxon  . LEFT HEART CATH AND CORONARY ANGIOGRAPHY N/A 07/14/2016   Procedure: Left Heart Cath and Coronary Angiography;  Surgeon: Jettie Booze, MD;  Location: [place] [facility];  Service: Cardiovascular;  Laterality: N/A;    Current Outpatient Prescriptions  Medication Sig Dispense Refill  . acetaminophen (TYLENOL) 325 MG tablet Take 2 tablets (650 mg total) by mouth every 4 (four) hours as needed for headache or mild pain.    Marland Kitchen ALPRAZolam (XANAX) 0.25 MG tablet Take 1 tablet (0.25 mg total) by mouth 2 (two) times daily as needed for anxiety. 20 tablet 0  . aspirin 81 MG chewable tablet Chew 1 tablet (81 mg total) by mouth daily.    Marland Kitchen atorvastatin (LIPITOR)  80 MG tablet Take 1 tablet (80 mg total) by mouth daily at 6 PM. 30 tablet 6  . hydrALAZINE (APRESOLINE) 50 MG tablet Take 1 tablet (50 mg total) by mouth 3 (three) times daily. 90 tablet 6  . losartan (COZAAR) 100 MG tablet Take 1 tablet (100 mg total) by mouth daily. 90 tablet 3  . metoprolol succinate (TOPROL-XL) 100 MG 24 hr tablet TAKE 1 TABLET BY MOUTH DAILY EITHER WITH OR IMMEDIATELY FOLLOWING A MEAL 30 tablet 0  . nitroGLYCERIN (NITROSTAT) 0.4 MG SL tablet Place 1 tablet (0.4 mg total) under the tongue every 5 (five) minutes x 3 doses as needed for chest pain. 25 tablet 4  . ticagrelor (BRILINTA) 90 MG TABS tablet Take 1 tablet (90 mg total) by mouth 2 (two) times daily. 180 tablet 4   No  current facility-administered medications for this visit.     Allergies:   Aleve [naproxen sodium]; Lisinopril; and Norvasc [amlodipine besylate]    Social History:  The patient  reports that she has never smoked. She has never used smokeless tobacco. She reports that she drinks alcohol. She reports that she does not use drugs.   Family History:  The patient's family history includes Diabetes in her brother and mother; Heart attack in her father and mother; Heart disease in her father and mother; Hypertension in her father.    ROS:  Please see the history of present illness. All other systems are reviewed and negative.    PHYSICAL EXAM: VS:  BP (!) 153/102   Pulse 76   Ht '5\' 1"'$  (1.549 m)   Wt 173 lb (78.5 kg)   LMP 07/07/2016 Comment: ncop  SpO2 99%   BMI 32.69 kg/m  , BMI Body mass index is 32.69 kg/m. GEN: Well nourished, well developed, female in no acute distress  HEENT: normal for age  Neck: no JVD, no carotid bruit, no masses Cardiac: RRR; no murmur, no rubs, or gallops Respiratory:  clear to auscultation bilaterally, normal work of breathing GI: soft, nontender, nondistended, + BS MS: no deformity or atrophy; no edema; distal pulses are 2+ in all 4 extremities; right radial cath site has extensive but resolving ecchymosis. No hematoma and distal pulses 2+  Skin: warm and dry, no rash Neuro:  Strength and sensation are intact Psych: euthymic mood, full affect   EKG:  EKG is ordered today. The ekg ordered today demonstrates sinus rhythm, no Q waves, normal intervals. Heart rate 73  CARDIAC CATH 07/14/16 by Dr. Irish Lack   Mid RCA lesion, 10 %stenosed.  Dist Cx lesion, 10 %stenosed.  Mid LAD lesion, 10 %stenosed.  2nd Mrg lesion, 100 %stenosed. THis was the culprit for her NSTEMI.  A STENT PROMUS PREM MR 8.58I50 drug eluting stent was successfully placed, postdilated to 2.5 mm.  Post intervention, there is a 0% residual stenosis.  The left ventricular systolic  function is normal.  LV end diastolic pressure is mildly elevated.  The left ventricular ejection fraction is 55-65% by visual estimate.  There is no aortic valve stenosis.  LVEDP 22 mm Hg. Continue aggressive secondary prevention. Plan for DAPT for 12 months without interruption. Chest pain that she had upon arrival to cath lab has resolved.    ECHO 07/15/16 Study Conclusions - Left ventricle: The cavity size was normal. There was mild concentric hypertrophy. Systolic function was normal. The estimated ejection fraction was in the range of 60% to 65%. Wall motion was normal; there were no regional wall motion abnormalities.  Left ventricular diastolic function parameters were normal. - Aortic valve: There was no regurgitation. - Aortic root: The aortic root was normal in size. - Mitral valve: There was mild regurgitation. - Left atrium: The atrium was normal in size. - Right ventricle: The cavity size was normal. Wall thickness was normal. Systolic function was normal. - Right atrium: The atrium was normal in size. - Tricuspid valve: There was trivial regurgitation. - Pulmonary arteries: Systolic pressure was within the normal range. - Inferior vena cava: The vessel was normal in size. - Pericardium, extracardiac: There was no pericardial effusion.   Recent Labs: 07/14/2016: ALT 34; Magnesium 1.8; TSH 1.893 07/16/2016: BUN 12; Creatinine, Ser 0.69; Hemoglobin 14.1; Platelets 319; Potassium 3.9; Sodium 135    Lipid Panel    Component Value Date/Time   CHOL 173 07/14/2016 0157   TRIG 245 (H) 07/14/2016 0157   HDL 39 (L) 07/14/2016 0157   CHOLHDL 4.4 07/14/2016 0157   VLDL 49 (H) 07/14/2016 0157   LDLCALC 85 07/14/2016 0157     Wt Readings from Last 3 Encounters:  07/24/16 173 lb (78.5 kg)  07/20/16 172 lb 9.6 oz (78.3 kg)  07/15/16 178 lb 12.7 oz (81.1 kg)     Other studies Reviewed: Additional studies/ records that were reviewed today include:  Hospital records and testing.  ASSESSMENT AND PLAN:  1.  Non-STEMI, subsequent episode of care: She is on good medical therapy with aspirin, Brilinta, high-dose statin, high-dose metoprolol, and high dose of Cozaar. Continue these medications in current doses. We will make sure that she has a referral to cardiac rehabilitation in that they call her.  2. Hypertension: Her blood pressure still not at goal. She was 136/82 with a manual cuff 153/102 with an automatic cuff. The automatic cuff readings are consistent with what she has been getting at home. We will increase the hydralazine to 75 mg 3 times a day. Continue other medications the current doses.  3. Hyperlipidemia: Continue high-dose statin. Recheck lipid and comp leak metabolic profile prior to follow-up with Dr. Stanford Breed.  4. Shortness of breath: She is getting symptoms consistently after she takes the Brilinta. They resolve in about an hour or so. She is encouraged to continue the Brilinta for the full 30 days. If the symptoms become worse, or do not improve by the end of the month, she can call and we can discuss changing her to Plavix.   Current medicines are reviewed at length with the patient today.  The patient does not have concerns regarding medicines.  The following changes have been made:  Increase hydralazine  Labs/ tests ordered today include:   Orders Placed This Encounter  Procedures  . Lipid panel  . Comprehensive metabolic panel  . EKG 12-Lead     Disposition:   FU with Dr. Stanford Breed  Signed, Rosaria Ferries, PA-C  07/24/2016 12:25 PM    [place] Phone: [phone]; Fax: [phone]  This note was written with the assistance of speech recognition software. Please excuse any transcriptional errors.

## 2016-07-28 ENCOUNTER — Ambulatory Visit: Payer: Self-pay | Attending: Family Medicine

## 2016-08-01 ENCOUNTER — Encounter: Payer: Self-pay | Admitting: Physician Assistant

## 2016-08-07 ENCOUNTER — Telehealth: Payer: Self-pay | Admitting: Cardiology

## 2016-08-07 NOTE — Telephone Encounter (Signed)
Closed encounter °

## 2016-08-14 ENCOUNTER — Encounter: Payer: Self-pay | Admitting: Family Medicine

## 2016-08-14 ENCOUNTER — Ambulatory Visit: Payer: Self-pay | Attending: Family Medicine | Admitting: Family Medicine

## 2016-08-14 VITALS — BP 173/92 | HR 62 | Temp 98.2°F | Resp 18 | Ht 61.0 in | Wt 170.4 lb

## 2016-08-14 DIAGNOSIS — Z7982 Long term (current) use of aspirin: Secondary | ICD-10-CM | POA: Insufficient documentation

## 2016-08-14 DIAGNOSIS — Z955 Presence of coronary angioplasty implant and graft: Secondary | ICD-10-CM | POA: Insufficient documentation

## 2016-08-14 DIAGNOSIS — Z9889 Other specified postprocedural states: Secondary | ICD-10-CM | POA: Insufficient documentation

## 2016-08-14 DIAGNOSIS — G43909 Migraine, unspecified, not intractable, without status migrainosus: Secondary | ICD-10-CM | POA: Insufficient documentation

## 2016-08-14 DIAGNOSIS — I2510 Atherosclerotic heart disease of native coronary artery without angina pectoris: Secondary | ICD-10-CM | POA: Insufficient documentation

## 2016-08-14 DIAGNOSIS — E785 Hyperlipidemia, unspecified: Secondary | ICD-10-CM | POA: Insufficient documentation

## 2016-08-14 DIAGNOSIS — Z888 Allergy status to other drugs, medicaments and biological substances status: Secondary | ICD-10-CM | POA: Insufficient documentation

## 2016-08-14 DIAGNOSIS — Z886 Allergy status to analgesic agent status: Secondary | ICD-10-CM | POA: Insufficient documentation

## 2016-08-14 DIAGNOSIS — G44229 Chronic tension-type headache, not intractable: Secondary | ICD-10-CM | POA: Insufficient documentation

## 2016-08-14 DIAGNOSIS — Z79899 Other long term (current) drug therapy: Secondary | ICD-10-CM | POA: Insufficient documentation

## 2016-08-14 DIAGNOSIS — I10 Essential (primary) hypertension: Secondary | ICD-10-CM | POA: Insufficient documentation

## 2016-08-14 DIAGNOSIS — I252 Old myocardial infarction: Secondary | ICD-10-CM | POA: Insufficient documentation

## 2016-08-14 DIAGNOSIS — Z95820 Peripheral vascular angioplasty status with implants and grafts: Secondary | ICD-10-CM

## 2016-08-14 DIAGNOSIS — G4730 Sleep apnea, unspecified: Secondary | ICD-10-CM | POA: Insufficient documentation

## 2016-08-14 DIAGNOSIS — Z959 Presence of cardiac and vascular implant and graft, unspecified: Secondary | ICD-10-CM

## 2016-08-14 MED ORDER — TICAGRELOR 90 MG PO TABS: 90.0000 mg | ORAL_TABLET | Freq: Two times a day (BID) | ORAL | 3 refills | Status: DC

## 2016-08-14 MED ORDER — CARVEDILOL 25 MG PO TABS: 25.0000 mg | ORAL_TABLET | Freq: Two times a day (BID) | ORAL | 3 refills | Status: DC

## 2016-08-14 MED ORDER — LOSARTAN POTASSIUM 100 MG PO TABS: 100.0000 mg | ORAL_TABLET | Freq: Every day | ORAL | 3 refills | Status: DC

## 2016-08-14 MED ORDER — ATORVASTATIN CALCIUM 80 MG PO TABS: 80.0000 mg | ORAL_TABLET | Freq: Every day | ORAL | 3 refills | Status: DC

## 2016-08-14 MED FILL — BRILINTA 90 MG TABLET: 90 | 30 days supply | Qty: 60 | Fill #0

## 2016-08-14 MED FILL — ATORVASTATIN 80 MG TABLET: 80 | 30 days supply | Qty: 30 | Fill #0

## 2016-08-14 MED FILL — CARVEDILOL 25 MG TABLET: 25 | 30 days supply | Qty: 60 | Fill #0

## 2016-08-14 NOTE — Progress Notes (Signed)
Subjective:  Patient ID: Jennifer Diaz, female    DOB: 06-28-1970  Age: 46 y.o. MRN: [id number]  CC: Establish Care (HTN)   HPI Jennifer Diaz is a 46 year old female with a history of hypertension, and STEMI status post drug-eluting stent on 07/2016 who presents today for follow-up visit. 2-D echo from 07/2016 revealed ejection fraction of 60-65%, no regional wall motion abnormalities.  She was seen by cardiology 2 weeks ago and her hydralazine dose was increased to 75 mg and she reports compliance with all her medications however her blood pressure remains elevated. Her home blood pressure log reveals blood pressures in the 150-170 range systolic. She has adhered to a DASH diet.  She complains of headaches which have been present for the last 3 days and denies nausea, vomiting or blurry vision. Denies chest pains, shortness of breath.  Past Medical History:  Diagnosis Date  . Allergy   . CAD in native artery 07/16/2016  . Coronary artery disease   . Hyperlipidemia   . Hypertension   . Migraine    "4-5/week" (07/14/2016)  . NSTEMI (non-ST elevated myocardial infarction) (HCC) 07/13/2016  . S/P angioplasty with stent 07/14/16 with DES to OM2 07/16/2016  . Sleep apnea   . Thyroid nodule 07/16/2016    Past Surgical History:  Procedure Laterality Date  . CORONARY ANGIOPLASTY WITH STENT PLACEMENT  07/14/2016  . CORONARY STENT INTERVENTION N/A 07/14/2016   Procedure: Coronary Stent Intervention;  Surgeon: Corky CraftsJayadeep S Varanasi, MD;  [facility];  Service: Cardiovascular;  Laterality: N/A;  . essure  April 2013   GYN- Dr. Algie CofferFogelman  . LEFT HEART CATH AND CORONARY ANGIOGRAPHY N/A 07/14/2016   Procedure: Left Heart Cath and Coronary Angiography;  Surgeon: Corky CraftsJayadeep S Varanasi, MD;  [facility];  Service: Cardiovascular;  Laterality: N/A;    Allergies  Allergen Reactions  . Aleve [Naproxen Sodium] Swelling and Other (See Comments)    Blisters to hand and mouth  .  Lisinopril Cough  . Norvasc [Amlodipine Besylate] Rash     Outpatient Medications Prior to Visit  Medication Sig Dispense Refill  . acetaminophen (TYLENOL) 325 MG tablet Take 2 tablets (650 mg total) by mouth every 4 (four) hours as needed for headache or mild pain.    Marland Kitchen. aspirin 81 MG chewable tablet Chew 1 tablet (81 mg total) by mouth daily.    . hydrALAZINE (APRESOLINE) 50 MG tablet Take 1.5 tablets (75 mg total) by mouth 3 (three) times daily. 135 tablet 6  . nitroGLYCERIN (NITROSTAT) 0.4 MG SL tablet Place 1 tablet (0.4 mg total) under the tongue every 5 (five) minutes x 3 doses as needed for chest pain. 25 tablet 4  . atorvastatin (LIPITOR) 80 MG tablet Take 1 tablet (80 mg total) by mouth daily at 6 PM. 30 tablet 6  . losartan (COZAAR) 100 MG tablet Take 1 tablet (100 mg total) by mouth daily. 90 tablet 3  . metoprolol succinate (TOPROL-XL) 100 MG 24 hr tablet TAKE 1 TABLET BY MOUTH DAILY EITHER WITH OR IMMEDIATELY FOLLOWING A MEAL 30 tablet 0  . ticagrelor (BRILINTA) 90 MG TABS tablet Take 1 tablet (90 mg total) by mouth 2 (two) times daily. 180 tablet 4  . ALPRAZolam (XANAX) 0.25 MG tablet Take 1 tablet (0.25 mg total) by mouth 2 (two) times daily as needed for anxiety. (Patient not taking: Reported on 08/14/2016) 20 tablet 0   No facility-administered medications prior to visit.     ROS Review  of Systems  Constitutional: Negative for activity change, appetite change and fatigue.  HENT: Negative for congestion, sinus pressure and sore throat.   Eyes: Negative for visual disturbance.  Respiratory: Negative for cough, chest tightness, shortness of breath and wheezing.   Cardiovascular: Negative for chest pain and palpitations.  Gastrointestinal: Negative for abdominal distention, abdominal pain and constipation.  Endocrine: Negative for polydipsia.  Genitourinary: Negative for dysuria and frequency.  Musculoskeletal: Negative for arthralgias and back pain.  Skin: Negative for  rash.  Neurological: Negative for tremors, light-headedness and numbness.  Hematological: Does not bruise/bleed easily.  Psychiatric/Behavioral: Negative for agitation and behavioral problems.    Objective:  BP (!) 173/92 (BP Location: Left Arm, Patient Position: Sitting, Cuff Size: Normal)   Pulse 62   Temp 98.2 F (36.8 C) (Oral)   Resp 18   Ht 5\' 1"  (1.549 m)   Wt 170 lb 6.4 oz (77.3 kg)   LMP 08/02/2016   SpO2 98%   BMI 32.20 kg/m   BP/Weight 08/14/2016 07/24/2016 07/20/2016  Systolic BP 173 153 169  Diastolic BP 92 102 105  Wt. (Lbs) 170.4 173 172.6  BMI 32.2 32.69 32.61      Physical Exam  Constitutional: She is oriented to person, place, and time. She appears well-developed and well-nourished.  Cardiovascular: Normal rate, normal heart sounds and intact distal pulses.   No murmur heard. Pulmonary/Chest: Effort normal and breath sounds normal. She has no wheezes. She has no rales. She exhibits no tenderness.  Abdominal: Soft. Bowel sounds are normal. She exhibits no distension and no mass. There is no tenderness.  Musculoskeletal: Normal range of motion.  Neurological: She is alert and oriented to person, place, and time.  Skin: Skin is warm and dry.  Psychiatric: She has a normal mood and affect.     Assessment & Plan:   1. Hypertension, unspecified type Uncontrolled Has been unable to tolerate lisinopril and amlodipine in the past Switched from metoprolol to carvedilol Low-sodium diet - carvedilol (COREG) 25 MG tablet; Take 1 tablet (25 mg total) by mouth 2 (two) times daily with a meal.  Dispense: 60 tablet; Refill: 3 - losartan (COZAAR) 100 MG tablet; Take 1 tablet (100 mg total) by mouth daily.  Dispense: 30 tablet; Refill: 3  2. S/P angioplasty with stent 07/14/16 with DES to OM2 Risk factor modification Patient encouraged to stop at the pharmacy to apply for Brilinta through the patient assistance program - ticagrelor (BRILINTA) 90 MG TABS tablet; Take 1  tablet (90 mg total) by mouth 2 (two) times daily.  Dispense: 180 tablet; Refill: 3 - atorvastatin (LIPITOR) 80 MG tablet; Take 1 tablet (80 mg total) by mouth daily at 6 PM.  Dispense: 30 tablet; Refill: 3  3. Chronic tension-type headache, not intractable Could be secondary to hydralazine If headache persists we might have to taper her off hydralazine Chart also reveals questionable history of migraines We'll have to revisit that at her next visit.   Meds ordered this encounter  Medications  . ticagrelor (BRILINTA) 90 MG TABS tablet    Sig: Take 1 tablet (90 mg total) by mouth 2 (two) times daily.    Dispense:  180 tablet    Refill:  3  . carvedilol (COREG) 25 MG tablet    Sig: Take 1 tablet (25 mg total) by mouth 2 (two) times daily with a meal.    Dispense:  60 tablet    Refill:  3    Discontinue metoprolol  . atorvastatin (  LIPITOR) 80 MG tablet    Sig: Take 1 tablet (80 mg total) by mouth daily at 6 PM.    Dispense:  30 tablet    Refill:  3  . losartan (COZAAR) 100 MG tablet    Sig: Take 1 tablet (100 mg total) by mouth daily.    Dispense:  30 tablet    Refill:  3    Follow-up: Return in about 2 weeks (around 08/28/2016) for follow up on Hypertension.   Jaclyn Shaggy MD

## 2016-08-14 NOTE — Progress Notes (Signed)
Patient is here to establish care for BP  Patient has taken medication around 9am today. Patient has eaten today.  Patient denies pain at this time.

## 2016-08-14 NOTE — Patient Instructions (Signed)

## 2016-09-11 ENCOUNTER — Ambulatory Visit: Payer: Self-pay | Attending: Family Medicine | Admitting: Family Medicine

## 2016-09-11 ENCOUNTER — Encounter: Payer: Self-pay | Admitting: Family Medicine

## 2016-09-11 VITALS — BP 134/76 | HR 68 | Temp 98.0°F | Resp 18 | Ht 61.0 in | Wt 167.0 lb

## 2016-09-11 DIAGNOSIS — I2510 Atherosclerotic heart disease of native coronary artery without angina pectoris: Secondary | ICD-10-CM | POA: Insufficient documentation

## 2016-09-11 DIAGNOSIS — I252 Old myocardial infarction: Secondary | ICD-10-CM | POA: Insufficient documentation

## 2016-09-11 DIAGNOSIS — Z888 Allergy status to other drugs, medicaments and biological substances status: Secondary | ICD-10-CM | POA: Insufficient documentation

## 2016-09-11 DIAGNOSIS — E785 Hyperlipidemia, unspecified: Secondary | ICD-10-CM | POA: Insufficient documentation

## 2016-09-11 DIAGNOSIS — Z79899 Other long term (current) drug therapy: Secondary | ICD-10-CM | POA: Insufficient documentation

## 2016-09-11 DIAGNOSIS — Z7902 Long term (current) use of antithrombotics/antiplatelets: Secondary | ICD-10-CM | POA: Insufficient documentation

## 2016-09-11 DIAGNOSIS — J302 Other seasonal allergic rhinitis: Secondary | ICD-10-CM | POA: Insufficient documentation

## 2016-09-11 DIAGNOSIS — Z955 Presence of coronary angioplasty implant and graft: Secondary | ICD-10-CM | POA: Insufficient documentation

## 2016-09-11 DIAGNOSIS — G4730 Sleep apnea, unspecified: Secondary | ICD-10-CM | POA: Insufficient documentation

## 2016-09-11 DIAGNOSIS — E041 Nontoxic single thyroid nodule: Secondary | ICD-10-CM | POA: Insufficient documentation

## 2016-09-11 DIAGNOSIS — Z959 Presence of cardiac and vascular implant and graft, unspecified: Secondary | ICD-10-CM

## 2016-09-11 DIAGNOSIS — J3089 Other allergic rhinitis: Secondary | ICD-10-CM

## 2016-09-11 DIAGNOSIS — I10 Essential (primary) hypertension: Secondary | ICD-10-CM | POA: Insufficient documentation

## 2016-09-11 DIAGNOSIS — Z95820 Peripheral vascular angioplasty status with implants and grafts: Secondary | ICD-10-CM

## 2016-09-11 DIAGNOSIS — T148XXA Other injury of unspecified body region, initial encounter: Secondary | ICD-10-CM

## 2016-09-11 DIAGNOSIS — R58 Hemorrhage, not elsewhere classified: Secondary | ICD-10-CM | POA: Insufficient documentation

## 2016-09-11 NOTE — Patient Instructions (Signed)

## 2016-09-11 NOTE — Progress Notes (Signed)
Patient is here for FU HTN  Patient denies pain at this time.  Patient has taken medication today. Patient has eaten today.  Patient request refills on medications.

## 2016-09-11 NOTE — Progress Notes (Signed)
Subjective:    Patient ID: Jennifer Diaz, female    DOB: 11/21/1970, 46 y.o.   MRN: [id number]  HPI  She is a 46 year old female with a history of hypertension, and STEMI status post drug-eluting stent in 07/2016 who presents today for follow-up visit of hypertension. Metoprolol had been switched to carvedilol at her last office visit. She had also complained of headaches at her last visit which she states have resolved at this time.  2-D echo from 07/2016 revealed ejection fraction of 60-65%, no regional wall motion abnormalities.  Last visit to cardiology was on 07/24/16 and her next visit is not until 3 months  She complains of nasal congestion, postnasal drip which occur more at night in the recumbent position; denies facial tenderness, fevers or tearing from eyes.  She endorses bruising on her arms and abdomen and does not recall a history of trauma. She had a CT angiogram of the chest which revealed an 18 mm thyroid nodule and thyroid ultrasound recommended.  Past Medical History:  Diagnosis Date  . Allergy   . CAD in native artery 07/16/2016  . Coronary artery disease   . Hyperlipidemia   . Hypertension   . Migraine    "4-5/week" (07/14/2016)  . NSTEMI (non-ST elevated myocardial infarction) (HCC) 07/13/2016  . S/P angioplasty with stent 07/14/16 with DES to OM2 07/16/2016  . Sleep apnea   . Thyroid nodule 07/16/2016    Past Surgical History:  Procedure Laterality Date  . CORONARY ANGIOPLASTY WITH STENT PLACEMENT  07/14/2016  . CORONARY STENT INTERVENTION N/A 07/14/2016   Procedure: Coronary Stent Intervention;  Surgeon: Corky Crafts, MD;  Location[facility] INVASIVE CV LAB;  Service: Cardiovascular;  Laterality: N/A;  . essure  April 2013   GYN- Dr. Algie Coffer  . LEFT HEART CATH AND CORONARY ANGIOGRAPHY N/A 07/14/2016   Procedure: Left Heart Cath and Coronary Angiography;  Surgeon: Corky Crafts, MD;  Location: [facility] INVASIVE CV LAB;  Service: Cardiovascular;  Laterality: N/A;     Allergies  Allergen Reactions  . Aleve [Naproxen Sodium] Swelling and Other (See Comments)    Blisters to hand and mouth  . Lisinopril Cough  . Norvasc [Amlodipine Besylate] Rash     Review of Systems  Constitutional: Negative for activity change, appetite change and fatigue.  HENT: Positive for congestion and postnasal drip. Negative for sinus pressure and sore throat.   Eyes: Negative for visual disturbance.  Respiratory: Negative for cough, chest tightness, shortness of breath and wheezing.   Cardiovascular: Negative for chest pain and palpitations.  Gastrointestinal: Negative for abdominal distention, abdominal pain and constipation.  Endocrine: Negative for polydipsia.  Genitourinary: Negative for dysuria and frequency.  Musculoskeletal: Negative for arthralgias and back pain.  Skin: Negative for rash.  Neurological: Negative for tremors, light-headedness and numbness.  Hematological: Bruises/bleeds easily.  Psychiatric/Behavioral: Negative for agitation and behavioral problems.       Objective: Vitals:   09/11/16 1123  BP: 134/76  Pulse: 68  Resp: 18  Temp: 98 F (36.7 C)  TempSrc: Oral  SpO2: 98%  Weight: 167 lb (75.8 kg)  Height: 5\' 1"  (1.549 m)      Physical Exam  Constitutional: She is oriented to person, place, and time. She appears well-developed and well-nourished.  HENT:  Right Ear: External ear normal.  Left Ear: External ear normal.  Mouth/Throat: Oropharynx is clear and moist.  Cardiovascular: Normal rate, normal heart sounds and intact distal pulses.   No murmur heard. Pulmonary/Chest: Effort normal and  breath sounds normal. She has no wheezes. She has no rales. She exhibits no tenderness.  Abdominal: Soft. Bowel sounds are normal. She exhibits no distension and no mass. There is no tenderness.  Musculoskeletal: Normal range of motion.  Neurological: She is alert and oriented to person, place, and time.  Skin: Skin is warm and dry.  Bruises  on the left upper arm and abdomen which are slightly tender  Psychiatric: She has a normal mood and affect.          Assessment & Plan:  1. Hypertension, unspecified type Controlled Continue carvedilol, hydralazine and losartan Low-sodium diet - carvedilol (COREG) 25 MG tablet; Take 1 tablet (25 mg total) by mouth 2 (two) times daily with a meal.  Dispense: 60 tablet; Refill: 3 - losartan (COZAAR) 100 MG tablet; Take 1 tablet (100 mg total) by mouth daily.  Dispense: 30 tablet; Refill: 3  2. S/P angioplasty with stent 07/14/16 with DES to OM2 Risk factor modification Patient encouraged to stop at the pharmacy to apply for Brilinta through the patient assistance program - ticagrelor (BRILINTA) 90 MG TABS tablet; Take 1 tablet (90 mg total) by mouth 2 (two) times daily.  Dispense: 180 tablet; Refill: 3 - atorvastatin (LIPITOR) 80 MG tablet; Take 1 tablet (80 mg total) by mouth daily at 6 PM.  Dispense: 30 tablet; Refill: 3  3. Seasonal allergies Advised to use OTC Zyrtec  4.Thyroid nodule Thyroid ultrasound ordered Normal TSH  5. Bruising Likely secondary to use of Brilinta

## 2016-09-13 ENCOUNTER — Ambulatory Visit (HOSPITAL_COMMUNITY)
Admission: RE | Admit: 2016-09-13 | Discharge: 2016-09-13 | Disposition: A | Discharge status: Home / Self Care | Payer: Self-pay | Source: Ambulatory Visit | Attending: Family Medicine | Admitting: Family Medicine

## 2016-09-13 ENCOUNTER — Ambulatory Visit (HOSPITAL_COMMUNITY): Admission: RE | Admit: 2016-09-13 | Payer: Self-pay | Source: Ambulatory Visit

## 2016-09-13 DIAGNOSIS — E041 Nontoxic single thyroid nodule: Secondary | ICD-10-CM

## 2016-09-13 MED FILL — LOSARTAN POTASSIUM 100 MG T: 100 | 30 days supply | Qty: 30 | Fill #1

## 2016-09-13 MED FILL — CARVEDILOL 25 MG TABLET: 25 | 30 days supply | Qty: 60 | Fill #1

## 2016-09-13 MED FILL — ATORVASTATIN 80 MG TABLET: 80 | 30 days supply | Qty: 30 | Fill #1

## 2016-09-19 MED FILL — BRILINTA 90 MG TABLET: 90 | 30 days supply | Qty: 60 | Fill #1

## 2016-10-04 NOTE — Progress Notes (Signed)
HPI: 46 year old female for follow-up of coronary artery disease. Admitted April 2018 and ruled in for a non-ST elevation myocardial infarction. CTA showed no pulmonary embolus. There was a thyroid nodule. Thyroid ultrasound showed benign mixed cystic and solid nodule in the right mid gland and no further follow-up recommended. Cardiac catheterization revealed an occluded second marginal but no other obstructive disease noted. LV function normal. Patient had PCI of her obtuse marginal with drug-eluting stent. Echocardiogram April 2018 showed normal LV function, mild mitral regurgitation. Since last seen, she has occasional pain in the left chest area for 30 seconds that she attributes to stress. It is not like her infarct pain. She denies exertional chest pain or dyspnea on exertion. No pedal edema.  Current Outpatient Prescriptions  Medication Sig Dispense Refill  . acetaminophen (TYLENOL) 325 MG tablet Take 2 tablets (650 mg total) by mouth every 4 (four) hours as needed for headache or mild pain.    Marland Kitchen. aspirin 81 MG chewable tablet Chew 1 tablet (81 mg total) by mouth daily.    Marland Kitchen. atorvastatin (LIPITOR) 80 MG tablet Take 1 tablet (80 mg total) by mouth daily at 6 PM. 30 tablet 3  . carvedilol (COREG) 25 MG tablet Take 1 tablet (25 mg total) by mouth 2 (two) times daily with a meal. 60 tablet 3  . hydrALAZINE (APRESOLINE) 50 MG tablet Take 1.5 tablets (75 mg total) by mouth 3 (three) times daily. 135 tablet 6  . losartan (COZAAR) 100 MG tablet Take 1 tablet (100 mg total) by mouth daily. 30 tablet 3  . nitroGLYCERIN (NITROSTAT) 0.4 MG SL tablet Place 1 tablet (0.4 mg total) under the tongue every 5 (five) minutes x 3 doses as needed for chest pain. 25 tablet 4  . ticagrelor (BRILINTA) 90 MG TABS tablet Take 1 tablet (90 mg total) by mouth 2 (two) times daily. 180 tablet 3   No current facility-administered medications for this visit.      Past Medical History:  Diagnosis Date  . Allergy    . CAD in native artery 07/16/2016  . Coronary artery disease   . Hyperlipidemia   . Hypertension   . Migraine    "4-5/week" (07/14/2016)  . NSTEMI (non-ST elevated myocardial infarction) (HCC) 07/13/2016  . S/P angioplasty with stent 07/14/16 with DES to OM2 07/16/2016  . Sleep apnea   . Thyroid nodule 07/16/2016    Past Surgical History:  Procedure Laterality Date  . CORONARY ANGIOPLASTY WITH STENT PLACEMENT  07/14/2016  . CORONARY STENT INTERVENTION N/A 07/14/2016   Procedure: Coronary Stent Intervention;  Surgeon: Corky CraftsJayadeep S Varanasi, MD;  [facility];  Service: Cardiovascular;  Laterality: N/A;  . essure  April 2013   GYN- Dr. Algie CofferFogelman  . LEFT HEART CATH AND CORONARY ANGIOGRAPHY N/A 07/14/2016   Procedure: Left Heart Cath and Coronary Angiography;  Surgeon: Corky CraftsJayadeep S Varanasi, MD;  [facility];  Service: Cardiovascular;  Laterality: N/A;    Social History   Social History  . Marital status: Married    Spouse name: N/A  . Number of children: N/A  . Years of education: N/A   Occupational History  . Not on file.   Social History Main Topics  . Smoking status: Never Smoker  . Smokeless tobacco: Never Used  . Alcohol use Yes     Comment: 07/14/2016 "maybe 2 mixed drinks/month"  . Drug use: No  . Sexual activity: Yes    Birth control/ protection:  IUD   Other Topics Concern  . Not on file   Social History Narrative  . No narrative on file    Family History  Problem Relation Age of Onset  . Diabetes Mother   . Heart attack Mother   . Heart disease Mother   . Hypertension Father   . Heart attack Father   . Heart disease Father   . Diabetes Brother     ROS: no fevers or chills, productive cough, hemoptysis, dysphasia, odynophagia, melena, hematochezia, dysuria, hematuria, rash, seizure activity, orthopnea, PND, pedal edema, claudication. Remaining systems are negative.  Physical Exam: Well-developed well-nourished in no acute distress.    Skin is warm and dry.  HEENT is normal.  Neck is supple.  Chest is clear to auscultation with normal expansion.  Cardiovascular exam is regular rate and rhythm.  Abdominal exam nontender or distended. No masses palpated. Extremities show no edema. neuro grossly intact   A/P  1  Coronary artery disease-status post PCI of obtuse marginal. Continue aspirin, statin, metoprolol, brilinta.  2 hypertension-blood pressure is mildly elevated. Increase hydralazine to 100 mg by mouth 3 times a day.  3 hyperlipidemia-continue statin. Check lipids and liver.  Olga Millers, MD

## 2016-10-05 ENCOUNTER — Ambulatory Visit: Payer: Self-pay | Attending: Family Medicine

## 2016-10-16 ENCOUNTER — Other Ambulatory Visit: Payer: Self-pay | Admitting: *Deleted

## 2016-10-16 DIAGNOSIS — Z95820 Peripheral vascular angioplasty status with implants and grafts: Secondary | ICD-10-CM

## 2016-10-16 MED ORDER — TICAGRELOR 90 MG PO TABS: 90.0000 mg | ORAL_TABLET | Freq: Two times a day (BID) | ORAL | 3 refills | Status: DC

## 2016-10-16 NOTE — Telephone Encounter (Signed)
PRINTED FOR PASS PROGRAM 

## 2016-10-18 ENCOUNTER — Ambulatory Visit (INDEPENDENT_AMBULATORY_CARE_PROVIDER_SITE_OTHER): Payer: Self-pay | Admitting: Cardiology

## 2016-10-18 ENCOUNTER — Encounter: Payer: Self-pay | Admitting: Cardiology

## 2016-10-18 VITALS — BP 136/90 | HR 71 | Ht 61.0 in | Wt 164.0 lb

## 2016-10-18 DIAGNOSIS — I10 Essential (primary) hypertension: Secondary | ICD-10-CM

## 2016-10-18 DIAGNOSIS — E7800 Pure hypercholesterolemia, unspecified: Secondary | ICD-10-CM

## 2016-10-18 DIAGNOSIS — I2510 Atherosclerotic heart disease of native coronary artery without angina pectoris: Secondary | ICD-10-CM

## 2016-10-18 MED ORDER — HYDRALAZINE HCL 100 MG PO TABS: 100.0000 mg | ORAL_TABLET | Freq: Three times a day (TID) | ORAL | 3 refills | Status: DC

## 2016-10-18 MED FILL — hydrALAZINE HCL 100 MG TABS: 100 | 30 days supply | Qty: 90 | Fill #0

## 2016-10-18 NOTE — Patient Instructions (Signed)
Medication Instructions:   INCREASE HYDRALAZINE TO 100 MG THREE TIMES DAILY= 2 OF THE 50 MG TABLETS THREE TIMES DAILY  Labwork:  Your physician recommends that you return for lab work WHEN FASTING  Follow-Up:  Your physician wants you to follow-up in: 6 MONTHS WITH DR Jens SomRENSHAW You will receive a reminder letter in the mail two months in advance. If you don't receive a letter, please call our office to schedule the follow-up appointment.   If you need a refill on your cardiac medications before your next appointment, please call your pharmacy.

## 2016-10-23 ENCOUNTER — Ambulatory Visit: Payer: Self-pay | Admitting: Cardiology

## 2016-10-27 MED FILL — BRILINTA 90 MG TABLET: 90 | 30 days supply | Qty: 60 | Fill #2

## 2016-10-27 MED FILL — LOSARTAN POTASSIUM 100 MG T: 100 | 30 days supply | Qty: 30 | Fill #2

## 2016-10-27 MED FILL — ?CARVEDILOL 25 MG TABLET: 25 | 30 days supply | Qty: 60 | Fill #2

## 2016-10-27 MED FILL — hydrALAZINE HCL 50 MG TABS: 50 | 30 days supply | Qty: 135 | Fill #1

## 2016-10-27 MED FILL — ATORVASTATIN 80 MG TABLET: 80 | 30 days supply | Qty: 30 | Fill #2

## 2016-11-22 MED FILL — LOSARTAN POTASSIUM 100 MG T: 100 | 30 days supply | Qty: 30 | Fill #0

## 2016-11-22 MED FILL — hydrALAZINE HCL 100 MG TABS: 100 | 30 days supply | Qty: 90 | Fill #1

## 2016-11-22 MED FILL — ?CARVEDILOL 25 MG TABLET: 25 | 30 days supply | Qty: 60 | Fill #3

## 2016-11-22 MED FILL — ATORVASTATIN 80 MG TABLET: 80 | 30 days supply | Qty: 30 | Fill #3

## 2016-11-23 ENCOUNTER — Encounter: Payer: Self-pay | Admitting: *Deleted

## 2016-11-23 MED FILL — BRILINTA 90 MG TABLET: 90 | 8 days supply | Qty: 16 | Fill #3

## 2016-12-13 ENCOUNTER — Ambulatory Visit: Payer: Self-pay | Admitting: Family Medicine

## 2016-12-19 ENCOUNTER — Other Ambulatory Visit: Payer: Self-pay | Admitting: Family Medicine

## 2016-12-19 DIAGNOSIS — I10 Essential (primary) hypertension: Secondary | ICD-10-CM

## 2016-12-19 DIAGNOSIS — Z95820 Peripheral vascular angioplasty status with implants and grafts: Secondary | ICD-10-CM

## 2016-12-19 MED FILL — LOSARTAN POTASSIUM 100 MG T: 100 | 30 days supply | Qty: 30 | Fill #1

## 2016-12-19 MED FILL — BRILINTA 90 MG TABLET: 90 | 8 days supply | Qty: 16 | Fill #4

## 2016-12-20 MED FILL — ?CARVEDILOL 25 MG TABLET: 25 | 30 days supply | Qty: 60 | Fill #0 | Status: TO

## 2016-12-20 MED FILL — ATORVASTATIN 80 MG TABLET: 80 | 30 days supply | Qty: 30 | Fill #0 | Status: TO

## 2017-01-23 MED FILL — hydrALAZINE HCL 100 MG TABS: 100 | 30 days supply | Qty: 90 | Fill #2

## 2017-01-23 MED FILL — BRILINTA 90 MG TABLET: 90 | 8 days supply | Qty: 16 | Fill #5

## 2017-01-23 MED FILL — LOSARTAN POTASSIUM 100 MG T: 100 | 30 days supply | Qty: 30 | Fill #3

## 2017-01-23 MED FILL — ?CARVEDILOL 25 MG TABLET: 25 | 30 days supply | Qty: 60 | Fill #1 | Status: TO

## 2017-01-23 MED FILL — ATORVASTATIN 80 MG TABLET: 80 | 30 days supply | Qty: 30 | Fill #1 | Status: TO

## 2017-02-06 ENCOUNTER — Encounter: Payer: Self-pay | Admitting: Cardiology

## 2017-02-20 MED FILL — BRILINTA 90 MG TABLET: 90 | 8 days supply | Qty: 16 | Fill #6

## 2017-02-20 MED FILL — LOSARTAN POTASSIUM 100 MG T: 100 | 30 days supply | Qty: 30 | Fill #2

## 2017-02-27 ENCOUNTER — Encounter: Payer: Self-pay | Admitting: Family Medicine

## 2017-02-27 ENCOUNTER — Ambulatory Visit: Payer: Self-pay | Attending: Family Medicine | Admitting: Family Medicine

## 2017-02-27 VITALS — BP 176/96 | HR 61 | Temp 98.0°F | Ht 61.0 in | Wt 173.8 lb

## 2017-02-27 DIAGNOSIS — I252 Old myocardial infarction: Secondary | ICD-10-CM | POA: Insufficient documentation

## 2017-02-27 DIAGNOSIS — Z23 Encounter for immunization: Secondary | ICD-10-CM | POA: Insufficient documentation

## 2017-02-27 DIAGNOSIS — Z959 Presence of cardiac and vascular implant and graft, unspecified: Secondary | ICD-10-CM

## 2017-02-27 DIAGNOSIS — Z7982 Long term (current) use of aspirin: Secondary | ICD-10-CM | POA: Insufficient documentation

## 2017-02-27 DIAGNOSIS — Z955 Presence of coronary angioplasty implant and graft: Secondary | ICD-10-CM | POA: Insufficient documentation

## 2017-02-27 DIAGNOSIS — I2510 Atherosclerotic heart disease of native coronary artery without angina pectoris: Secondary | ICD-10-CM | POA: Insufficient documentation

## 2017-02-27 DIAGNOSIS — I10 Essential (primary) hypertension: Secondary | ICD-10-CM | POA: Insufficient documentation

## 2017-02-27 DIAGNOSIS — G4730 Sleep apnea, unspecified: Secondary | ICD-10-CM | POA: Insufficient documentation

## 2017-02-27 DIAGNOSIS — R21 Rash and other nonspecific skin eruption: Secondary | ICD-10-CM | POA: Insufficient documentation

## 2017-02-27 DIAGNOSIS — Z95820 Peripheral vascular angioplasty status with implants and grafts: Secondary | ICD-10-CM

## 2017-02-27 DIAGNOSIS — E785 Hyperlipidemia, unspecified: Secondary | ICD-10-CM | POA: Insufficient documentation

## 2017-02-27 DIAGNOSIS — Z79899 Other long term (current) drug therapy: Secondary | ICD-10-CM | POA: Insufficient documentation

## 2017-02-27 DIAGNOSIS — Z886 Allergy status to analgesic agent status: Secondary | ICD-10-CM | POA: Insufficient documentation

## 2017-02-27 MED ORDER — TICAGRELOR 90 MG PO TABS: 90.0000 mg | ORAL_TABLET | Freq: Two times a day (BID) | ORAL | 6 refills | Status: DC

## 2017-02-27 MED ORDER — CARVEDILOL 25 MG PO TABS: ORAL_TABLET | ORAL | 6 refills | Status: DC

## 2017-02-27 MED ORDER — HYDRALAZINE HCL 100 MG PO TABS: 100.0000 mg | ORAL_TABLET | Freq: Three times a day (TID) | ORAL | 6 refills | Status: DC

## 2017-02-27 MED ORDER — LOSARTAN POTASSIUM 100 MG PO TABS: 100.0000 mg | ORAL_TABLET | Freq: Every day | ORAL | 6 refills | Status: DC

## 2017-02-27 MED ORDER — ATORVASTATIN CALCIUM 80 MG PO TABS: ORAL_TABLET | ORAL | 6 refills | Status: DC

## 2017-02-27 MED FILL — BRILINTA 90 MG TABLET: 90 | 8 days supply | Qty: 16 | Fill #0

## 2017-02-27 MED FILL — ATORVASTATIN 80 MG TABLET: 80 | 30 days supply | Qty: 30 | Fill #0

## 2017-02-27 MED FILL — LOSARTAN POTASSIUM 100 MG T: 100 | 30 days supply | Qty: 30 | Fill #0

## 2017-02-27 MED FILL — hydrALAZINE HCL 100 MG TABS: 100 | 30 days supply | Qty: 90 | Fill #0

## 2017-02-27 MED FILL — ?CARVEDILOL 25 MG TABLET: 25 | 30 days supply | Qty: 60 | Fill #0

## 2017-02-27 NOTE — Patient Instructions (Signed)

## 2017-02-27 NOTE — Progress Notes (Signed)
Subjective:  Patient ID: Jennifer Diaz, female    DOB: 06-01-1970  Age: 46 y.o. MRN: [id number]  CC: Hypertension   HPI Shaelin Lalley  is a 46 year old female with a history of hypertension, and STEMI status post drug-eluting stent in 07/2016 (currently on dual antiplatelet therapy with Brilinta and aspirin) who presents today for follow-up visit of hypertension.  She denies chest pains, shortness of breath and has had some occasions where she was out of her Brilinta due to a problem with obtaining it from the pharmacy.  She now receives 7-day supplies of Brilinta and was informed she would need to obtain a blood cord prior to obtaining her medications. Last visit to cardiology was in 10/2016.  Her blood pressure is elevated and she endorses not taking her antihypertensives this morning because she is yet to eat breakfast.  She noticed a rash beneath right breast which occurred in clusters with a slightly purulent discharge but rash has resolved ever since the weather turned cold.  She denies pruritus.  Past Medical History:  Diagnosis Date  . Allergy   . CAD in native artery 07/16/2016  . Coronary artery disease   . Hyperlipidemia   . Hypertension   . Migraine    "4-5/week" (07/14/2016)  . NSTEMI (non-ST elevated myocardial infarction) ([place]) 07/13/2016  . S/P angioplasty with stent 07/14/16 with DES to OM2 07/16/2016  . Sleep apnea   . Thyroid nodule 07/16/2016    Past Surgical History:  Procedure Laterality Date  . CORONARY ANGIOPLASTY WITH STENT PLACEMENT  07/14/2016  . Coronary Stent Intervention N/A 07/14/2016   Performed by Jettie Booze, MD at [facility]  . essure  April 2013   GYN- Dr. Valentino Saxon  . Left Heart Cath and Coronary Angiography N/A 07/14/2016   Performed by Jettie Booze, MD at [facility]    Allergies  Allergen Reactions  . Aleve [Naproxen Sodium] Swelling and Other (See Comments)    Blisters to hand and mouth  . Lisinopril Cough  .  Norvasc [Amlodipine Besylate] Rash     Outpatient Medications Prior to Visit  Medication Sig Dispense Refill  . acetaminophen (TYLENOL) 325 MG tablet Take 2 tablets (650 mg total) by mouth every 4 (four) hours as needed for headache or mild pain.    Marland Kitchen aspirin 81 MG chewable tablet Chew 1 tablet (81 mg total) by mouth daily.    . nitroGLYCERIN (NITROSTAT) 0.4 MG SL tablet Place 1 tablet (0.4 mg total) under the tongue every 5 (five) minutes x 3 doses as needed for chest pain. 25 tablet 4  . atorvastatin (LIPITOR) 80 MG tablet TAKE 1 TABLET BY MOUTH DAILY AT 6 PM. 30 tablet 3  . carvedilol (COREG) 25 MG tablet TAKE 1 TABLET BY MOUTH 2 TIMES DAILY WITH A MEAL. 60 tablet 3  . hydrALAZINE (APRESOLINE) 100 MG tablet Take 1 tablet (100 mg total) by mouth 3 (three) times daily. 270 tablet 3  . losartan (COZAAR) 100 MG tablet Take 1 tablet (100 mg total) by mouth daily. 30 tablet 3  . ticagrelor (BRILINTA) 90 MG TABS tablet Take 1 tablet (90 mg total) by mouth 2 (two) times daily. 180 tablet 3   No facility-administered medications prior to visit.     ROS Review of Systems  Constitutional: Negative for activity change, appetite change and fatigue.  HENT: Negative for congestion, sinus pressure and sore throat.   Eyes: Negative for visual disturbance.  Respiratory: Negative for cough,  chest tightness, shortness of breath and wheezing.   Cardiovascular: Negative for chest pain and palpitations.  Gastrointestinal: Negative for abdominal distention, abdominal pain and constipation.  Endocrine: Negative for polydipsia.  Genitourinary: Negative for dysuria and frequency.  Musculoskeletal: Negative for arthralgias and back pain.  Skin: Positive for rash.  Neurological: Negative for tremors, light-headedness and numbness.  Hematological: Does not bruise/bleed easily.  Psychiatric/Behavioral: Negative for agitation and behavioral problems.    Objective:  BP (!) 176/96   Pulse 61   Temp 98 F  (36.7 C) (Oral)   Ht '5\' 1"'$  (1.549 m)   Wt 173 lb 12.8 oz (78.8 kg)   SpO2 99%   BMI 32.84 kg/m   BP/Weight 02/27/2017 12/02/2351 09/08/4429  Systolic BP 540 086 761  Diastolic BP 96 90 76  Wt. (Lbs) 173.8 164 167  BMI 32.84 30.99 31.55      Physical Exam  Constitutional: She is oriented to person, place, and time. She appears well-developed and well-nourished.  Cardiovascular: Normal rate, normal heart sounds and intact distal pulses.  No murmur heard. Pulmonary/Chest: Effort normal and breath sounds normal. She has no wheezes. She has no rales. She exhibits no tenderness.  Abdominal: Soft. Bowel sounds are normal. She exhibits no distension and no mass. There is no tenderness.  Musculoskeletal: Normal range of motion.  Neurological: She is alert and oriented to person, place, and time.  Skin:  Few scars from residual rash in bilateral inframammary region  Psychiatric: She has a normal mood and affect.    CMP Latest Ref Rng & Units 07/16/2016 07/15/2016 07/14/2016  Glucose 65 - 99 mg/dL 116(H) 110(H) 121(H)  BUN 6 - 20 mg/dL 12 6 5(L)  Creatinine 0.44 - 1.00 mg/dL 0.69 0.69 0.61  Sodium 135 - 145 mmol/L 135 136 137  Potassium 3.5 - 5.1 mmol/L 3.9 3.4(L) 3.0(L)  Chloride 101 - 111 mmol/L 105 102 98(L)  CO2 22 - 32 mmol/L '22 22 24  '$ Calcium 8.9 - 10.3 mg/dL 8.9 9.0 9.3  Total Protein 6.5 - 8.1 g/dL - - -  Total Bilirubin 0.3 - 1.2 mg/dL - - -  Alkaline Phos 38 - 126 U/L - - -  AST 15 - 41 U/L - - -  ALT 14 - 54 U/L - - -    Lipid Panel     Component Value Date/Time   CHOL 173 07/14/2016 0157   TRIG 245 (H) 07/14/2016 0157   HDL 39 (L) 07/14/2016 0157   CHOLHDL 4.4 07/14/2016 0157   VLDL 49 (H) 07/14/2016 0157   LDLCALC 85 07/14/2016 0157    Assessment & Plan:   1. Hypertension, unspecified type Uncontrolled due to not taking antihypertensives this morning No regimen change Low sodium, DASH diet - carvedilol (COREG) 25 MG tablet; TAKE 1 TABLET BY MOUTH 2 TIMES DAILY  WITH A MEAL.  Dispense: 60 tablet; Refill: 6 - losartan (COZAAR) 100 MG tablet; Take 1 tablet (100 mg total) daily by mouth.  Dispense: 30 tablet; Refill: 6 - CMP14+EGFR - Lipid panel  2. Need for influenza vaccination - Flu Vaccine QUAD 36+ mos IM  3. S/P angioplasty with stent 07/14/16 with DES to OM2 LDL is 85; goal is less than 70 Continue dual antiplatelet therapy with Brilinta and aspirin Low-cholesterol diet, continue statin and will check lipid panel today  Advised to complete application for financial assistance to prevent interruption of Brilinta supply - atorvastatin (LIPITOR) 80 MG tablet; TAKE 1 TABLET BY MOUTH DAILY AT 6 PM.  Dispense: 30 tablet; Refill: 6 - ticagrelor (BRILINTA) 90 MG TABS tablet; Take 1 tablet (90 mg total) 2 (two) times daily by mouth.  Dispense: 60 tablet; Refill: 6  4. Coronary artery disease involving native coronary artery of native heart without angina pectoris Asymptomatic at this time - hydrALAZINE (APRESOLINE) 100 MG tablet; Take 1 tablet (100 mg total) 3 (three) times daily by mouth.  Dispense: 90 tablet; Refill: 6  5. Rash Resolved   Meds ordered this encounter  Medications  . atorvastatin (LIPITOR) 80 MG tablet    Sig: TAKE 1 TABLET BY MOUTH DAILY AT 6 PM.    Dispense:  30 tablet    Refill:  6  . carvedilol (COREG) 25 MG tablet    Sig: TAKE 1 TABLET BY MOUTH 2 TIMES DAILY WITH A MEAL.    Dispense:  60 tablet    Refill:  6  . hydrALAZINE (APRESOLINE) 100 MG tablet    Sig: Take 1 tablet (100 mg total) 3 (three) times daily by mouth.    Dispense:  90 tablet    Refill:  6  . losartan (COZAAR) 100 MG tablet    Sig: Take 1 tablet (100 mg total) daily by mouth.    Dispense:  30 tablet    Refill:  6  . ticagrelor (BRILINTA) 90 MG TABS tablet    Sig: Take 1 tablet (90 mg total) 2 (two) times daily by mouth.    Dispense:  60 tablet    Refill:  6    Follow-up: Return in about 3 months (around 05/30/2017) for follow up of chronic medical  conditions.   Arnoldo Morale MD

## 2017-02-28 LAB — CMP14+EGFR
A/G RATIO: 1.4 (ref 1.2–2.2)
ALT: 26 IU/L (ref 0–32)
AST: 19 IU/L (ref 0–40)
Albumin: 4 g/dL (ref 3.5–5.5)
Alkaline Phosphatase: 103 IU/L (ref 39–117)
BUN/Creatinine Ratio: 10 (ref 9–23)
BUN: 8 mg/dL (ref 6–24)
Bilirubin Total: 0.6 mg/dL (ref 0.0–1.2)
CALCIUM: 9.1 mg/dL (ref 8.7–10.2)
CO2: 24 mmol/L (ref 20–29)
Chloride: 105 mmol/L (ref 96–106)
Creatinine, Ser: 0.82 mg/dL (ref 0.57–1.00)
GFR, EST AFRICAN AMERICAN: 98 mL/min/{1.73_m2} (ref >59)
GFR, EST NON AFRICAN AMERICAN: 85 mL/min/{1.73_m2} (ref >59)
GLOBULIN, TOTAL: 2.8 g/dL (ref 1.5–4.5)
Glucose: 97 mg/dL (ref 65–99)
POTASSIUM: 4.4 mmol/L (ref 3.5–5.2)
SODIUM: 141 mmol/L (ref 134–144)
TOTAL PROTEIN: 6.8 g/dL (ref 6.0–8.5)

## 2017-02-28 LAB — LIPID PANEL
CHOL/HDL RATIO: 2.2 ratio (ref 0.0–4.4)
Cholesterol, Total: 142 mg/dL (ref 100–199)
HDL: 66 mg/dL (ref >39)
LDL CALC: 61 mg/dL (ref 0–99)
Triglycerides: 76 mg/dL (ref 0–149)
VLDL Cholesterol Cal: 15 mg/dL (ref 5–40)

## 2017-03-07 MED FILL — $BRILINTA 90 MG TABLET: 90 | 30 days supply | Qty: 60 | Fill #1 | Status: TO

## 2017-03-13 ENCOUNTER — Encounter: Payer: Self-pay | Admitting: Cardiology

## 2017-03-29 ENCOUNTER — Encounter: Payer: Self-pay | Admitting: Cardiology

## 2017-03-30 ENCOUNTER — Encounter: Payer: Self-pay | Admitting: Family Medicine

## 2017-04-12 MED FILL — ATORVASTATIN 80 MG TABLET: 80 | 30 days supply | Qty: 30 | Fill #1

## 2017-04-12 MED FILL — ?CARVEDILOL 25 MG TABLET: 25 | 30 days supply | Qty: 60 | Fill #1

## 2017-04-12 MED FILL — hydrALAZINE HCL 100 MG TABS: 100 | 30 days supply | Qty: 90 | Fill #1

## 2017-04-12 MED FILL — LOSARTAN POTASSIUM 100 MG T: 100 | 30 days supply | Qty: 30 | Fill #1

## 2017-04-12 MED FILL — $BRILINTA 90 MG TABLET: 90 | 30 days supply | Qty: 60 | Fill #2 | Status: TO

## 2017-04-13 ENCOUNTER — Encounter: Payer: Self-pay | Admitting: Family Medicine

## 2017-04-15 ENCOUNTER — Encounter: Payer: Self-pay | Admitting: Cardiology

## 2017-04-16 ENCOUNTER — Telehealth: Payer: Self-pay | Admitting: Cardiology

## 2017-04-16 NOTE — Telephone Encounter (Signed)
Left a message to call back.

## 2017-04-16 NOTE — Telephone Encounter (Signed)
Returned the call to the patient. She has been having on and off chest pain for the past two months. She stated that it has not been bad enough that she felt like she needed to take a nitroglycerin. She does not feel like anything particular she does brings on the pain or relieves it. Per an email she sent via MyChart, it has been recommended that she have an appointment. One has been made for 1/9 with Joni ReiningKathryn Lawrence, DNP. The patient has stated that if the pain gets worse she will take a nitroglycerin and go to the hospital.

## 2017-04-16 NOTE — Telephone Encounter (Signed)
New Message  Pt c/o of Chest Pain: STAT if CP now or developed within 24 hours  1. Are you having CP right now? No. Per pt states the are sharp pains.   2. Are you experiencing any other symptoms (ex. SOB, nausea, vomiting, sweating)? No   3. How long have you been experiencing CP? Per pt a couple of weeks   4. Is your CP continuous or coming and going? Coming and going   5. Have you taken Nitroglycerin? No    ?

## 2017-04-17 NOTE — Telephone Encounter (Signed)
Mychart message

## 2017-04-17 NOTE — Progress Notes (Signed)
Cardiology Office Note   Date:  04/18/2017   ID:  Jennifer Breednjli Kempton, DOB 06/24/1970, MRN [id number]  PCP:  Jaclyn ShaggyAmao, Enobong, MD  Cardiologist:  Ivor MessierBrain Crenshaw, MD  Chief Complaint  Patient presents with  . Chest Pain    fatigue, says she feels swollen all over  . Coronary Artery Disease  . Hyperlipidemia  . Hypertension     History of Present Illness: Jennifer Diaz is a 47 y.o. female who presents for ongoing assessment and management of coronary artery disease, with history of non-ST elevation myocardial infarction in April 2018. Cardiac catheterization revealed an occluded second marginal but no other obstructive disease noted. LV function normal. Patient had PCI of her obtuse marginal with drug-eluting stent. Echocardiogram April 2018 showed normal LV function, mild mitral regurgitation.  She remains on dual antiplatelet therapy with aspirin and Brilinta.   Other history includes, hypercholesterolemia, hypertension, thyroid nodule, found to be benign with mixed cystic and solid nodule in the right and mid gland with no further follow-up recommendation, and OSA.Marland Kitchen.  She was last seen in the office by Dr. Jens Somrenshaw on 10/18/2016.  At that time she was doing well was asymptomatic.  Blood pressure was found to be mildly elevated in the office.  And Dr. Jens Somrenshaw increase her hydralazine to 100 mg by mouth 3 times daily.  Follow-up labs to include lipids and LFTs were ordered.  The patient called our office on 04/16/2017 with complaints of chest pain, coming and going, on return phone call, she was stating that the pain has been going on for the last 2 months.  The pain was found to have that she felt as if she had to take nitroglycerin.  She is here today for follow-up but was advised to go to the emergency room should she have recurrent symptoms that were unrelenting.  She comes today stating that she is having left midsternal sharp chest pain occasional pressure not associated with activity, no  associated dyspnea diaphoresis or nausea.  She states that it comes and goes can last an hour at a time and go away on its own.  She has not had to take any nitroglycerin.  She has noticed that her blood pressures were elevated at home.  She has not taken her medications today yet she normally takes around 9 AM.  She is quite concerned about the recurrence of her chest discomfort because it is reminiscent of the pain she experienced prior to her myocardial infarction and stent placement.   Past Medical History:  Diagnosis Date  . Allergy   . CAD in native artery 07/16/2016  . Coronary artery disease   . Hyperlipidemia   . Hypertension   . Migraine    "4-5/week" (07/14/2016)  . NSTEMI (non-ST elevated myocardial infarction) ([facility]) 07/13/2016  . S/P angioplasty with stent 07/14/16 with DES to OM2 07/16/2016  . Sleep apnea   . Thyroid nodule 07/16/2016    Past Surgical History:  Procedure Laterality Date  . CORONARY ANGIOPLASTY WITH STENT PLACEMENT  07/14/2016  . CORONARY STENT INTERVENTION N/A 07/14/2016   Procedure: Coronary Stent Intervention;  Surgeon: Corky CraftsJayadeep S Varanasi, MD;  [facility];  Service: Cardiovascular;  Laterality: N/A;  . essure  April 2013   GYN- Dr. Algie CofferFogelman  . LEFT HEART CATH AND CORONARY ANGIOGRAPHY N/A 07/14/2016   Procedure: Left Heart Cath and Coronary Angiography;  Surgeon: Corky CraftsJayadeep S Varanasi, MD;  [facility];  Service: Cardiovascular;  Laterality: N/A;  Current Outpatient Medications  Medication Sig Dispense Refill  . acetaminophen (TYLENOL) 325 MG tablet Take 2 tablets (650 mg total) by mouth every 4 (four) hours as needed for headache or mild pain.    Marland Kitchen aspirin 81 MG chewable tablet Chew 1 tablet (81 mg total) by mouth daily.    Marland Kitchen atorvastatin (LIPITOR) 80 MG tablet TAKE 1 TABLET BY MOUTH DAILY AT 6 PM. 30 tablet 6  . carvedilol (COREG) 25 MG tablet TAKE 1 TABLET BY MOUTH 2 TIMES DAILY WITH A MEAL. 60 tablet 6  . hydrALAZINE  (APRESOLINE) 100 MG tablet Take 1 tablet (100 mg total) 3 (three) times daily by mouth. 90 tablet 6  . losartan (COZAAR) 100 MG tablet Take 1 tablet (100 mg total) daily by mouth. 30 tablet 6  . nitroGLYCERIN (NITROSTAT) 0.4 MG SL tablet Place 1 tablet (0.4 mg total) under the tongue every 5 (five) minutes x 3 doses as needed for chest pain. 25 tablet 4  . ticagrelor (BRILINTA) 90 MG TABS tablet Take 1 tablet (90 mg total) 2 (two) times daily by mouth. 60 tablet 6   No current facility-administered medications for this visit.     Allergies:   Aleve [naproxen sodium]; Lisinopril; and Norvasc [amlodipine besylate]    Social History:  The patient  reports that  has never smoked. she has never used smokeless tobacco. She reports that she drinks alcohol. She reports that she does not use drugs.   Family History:  The patient's family history includes Diabetes in her brother and mother; Heart attack in her father and mother; Heart disease in her father and mother; Hypertension in her father.    ROS: All other systems are reviewed and negative. Unless otherwise mentioned in H&P    PHYSICAL EXAM: VS:  BP (!) 160/98   Pulse (!) 53   Ht 5\' 1"  (1.549 m)   Wt 174 lb (78.9 kg)   BMI 32.88 kg/m  , BMI Body mass index is 32.88 kg/m. GEN: Well nourished, well developed, in no acute distress  HEENT: normal  Neck: no JVD, carotid bruits, or masses Cardiac: RRR; no murmurs, rubs, or gallops,no edema  Respiratory:  clear to auscultation bilaterally, normal work of breathing GI: soft, nontender, nondistended, + BS MS: no deformity or atrophy  Skin: warm and dry, no rash Neuro:  Strength and sensation are intact Psych: euthymic mood, full affect, easily tearful   EKG: Normal sinus rhythm   Heart rate of 53 bpm.  Recent Labs: 07/14/2016: Magnesium 1.8; TSH 1.893 07/16/2016: Hemoglobin 14.1; Platelets 319 02/27/2017: ALT 26; BUN 8; Creatinine, Ser 0.82; Potassium 4.4; Sodium 141    Lipid Panel      Component Value Date/Time   CHOL 142 02/27/2017 0951   TRIG 76 02/27/2017 0951   HDL 66 02/27/2017 0951   CHOLHDL 2.2 02/27/2017 0951   CHOLHDL 4.4 07/14/2016 0157   VLDL 49 (H) 07/14/2016 0157   LDLCALC 61 02/27/2017 0951      Wt Readings from Last 3 Encounters:  04/18/17 174 lb (78.9 kg)  02/27/17 173 lb 12.8 oz (78.8 kg)  10/18/16 164 lb (74.4 kg)      Other studies Reviewed: Cardiac Cath 07/14/2016 Conclusion     Mid RCA lesion, 10 %stenosed.  Dist Cx lesion, 10 %stenosed.  Mid LAD lesion, 10 %stenosed.  2nd Mrg lesion, 100 %stenosed. THis was the culprit for her NSTEMI.  A STENT PROMUS PREM MR 2.25X12 drug eluting stent was successfully placed, postdilated to  2.5 mm.  Post intervention, there is a 0% residual stenosis.  The left ventricular systolic function is normal.  LV end diastolic pressure is mildly elevated.  The left ventricular ejection fraction is 55-65% by visual estimate.  There is no aortic valve stenosis.  LVEDP 22 mm Hg.   Continue aggressive secondary prevention.  Plan for DAPT for 12 months without interruption.  Chest pain that she had upon arrival to cath lab has resolved.    Echocardiogram 07/15/2016 Study Conclusions  - Left ventricle: The cavity size was normal. There was mild   concentric hypertrophy. Systolic function was normal. The   estimated ejection fraction was in the range of 60% to 65%. Wall   motion was normal; there were no regional wall motion   abnormalities. Left ventricular diastolic function parameters   were normal. - Aortic valve: There was no regurgitation. - Aortic root: The aortic root was normal in size. - Mitral valve: There was mild regurgitation. - Left atrium: The atrium was normal in size. - Right ventricle: The cavity size was normal. Wall thickness was   normal. Systolic function was normal. - Right atrium: The atrium was normal in size. - Tricuspid valve: There was trivial regurgitation. -  Pulmonary arteries: Systolic pressure was within the normal   range. - Inferior vena cava: The vessel was normal in size. - Pericardium, extracardiac: There was no pericardial effusion.    ASSESSMENT AND PLAN:  1.  Coronary artery disease: History of drug-eluting stent to the 2nd marginal.  Nonobstructive disease elsewhere.  The patient is continued on dual antiplatelet therapy with Brilinta and aspirin.  EKG is unchanged from prior EKG completed during hospitalization in April.   I will plan nuclear medicine stress Myoview for evaluation of progressive coronary artery disease causing her symptoms.  She will discontinue carvedilol morning of stress test but will take it soon thereafter to allow for better heart rate response to exercise and provided good test.  2.  Hypertension: Not well controlled today.  The patient has not taken her medication yet today she is on carvedilol losartan and hydralazine.  I am going to add low-dose chlorthalidone 12.5 mg daily.  I have advised her that this is a diuretic and she may notice increased urine production.  She verbalizes understanding.  She states that her blood pressure remains elevated at home in the 140s and 150s systolic.  She states that she and her family are going through a lot of stress which may be contributing.  It is not optimal for someone with coronary artery disease.  Therefore better control will be initiated with chlorthalidone.  3.  Hypercholesterolemia: Patient will continue atorvastatin 80 mg daily as directed.  4.  Questionable:GERD: Begin her on low-dose Protonix 20 mg daily.  I have advised her that she may not experience full effect of Protonix for a couple of days if this is indeed a partial cause of her discomfort substernally.   Current medicines are reviewed at length with the patient today.    Labs/ tests ordered today include: Nuclear medicine stress test. Bettey Mare. Liborio Nixon, ANP, AACC   04/18/2017 8:30 AM    [facility] [address] [place], [place] [address] Phone: [phone]; Fax: [phone]

## 2017-04-18 ENCOUNTER — Encounter: Payer: Self-pay | Admitting: Adult Health

## 2017-04-18 ENCOUNTER — Ambulatory Visit: Payer: Self-pay | Admitting: Adult Health

## 2017-04-18 ENCOUNTER — Telehealth (HOSPITAL_COMMUNITY): Payer: Self-pay

## 2017-04-18 ENCOUNTER — Ambulatory Visit (INDEPENDENT_AMBULATORY_CARE_PROVIDER_SITE_OTHER): Payer: Self-pay | Admitting: Adult Health

## 2017-04-18 VITALS — BP 160/98 | HR 53 | Ht 61.0 in | Wt 174.0 lb

## 2017-04-18 DIAGNOSIS — I2510 Atherosclerotic heart disease of native coronary artery without angina pectoris: Secondary | ICD-10-CM

## 2017-04-18 DIAGNOSIS — R079 Chest pain, unspecified: Secondary | ICD-10-CM

## 2017-04-18 DIAGNOSIS — I10 Essential (primary) hypertension: Secondary | ICD-10-CM

## 2017-04-18 DIAGNOSIS — E7800 Pure hypercholesterolemia, unspecified: Secondary | ICD-10-CM

## 2017-04-18 MED ORDER — PANTOPRAZOLE SODIUM 20 MG PO TBEC: 20.0000 mg | DELAYED_RELEASE_TABLET | Freq: Every day | ORAL | 3 refills | Status: DC

## 2017-04-18 MED ORDER — CHLORTHALIDONE 25 MG PO TABS: 12.5000 mg | ORAL_TABLET | Freq: Every day | ORAL | 3 refills | Status: DC

## 2017-04-18 MED FILL — ?CHLORTHALIDONE 25 MG TABLE: 25 | 30 days supply | Qty: 15 | Fill #0

## 2017-04-18 MED FILL — PANTOPRAZOLE SOD DR 20 MG T: 20 | 30 days supply | Qty: 30 | Fill #0

## 2017-04-18 NOTE — Patient Instructions (Signed)
Medication Instructions:  START CHLORTHALIDONE 12.5MG  (1/2 TAB-25MG )  START PROTONIX 25MG  DAILY  If you need a refill on your cardiac medications before your next appointment, please call your pharmacy.  Testing/Procedures: Your physician has requested that you have a stress myoview. A exercise Myoview stress test is used to check for coronary disease. During the procedure, a small amount of radioactive isotope called Myoview is injected into a vein, one injection is given through an IV when you first begin the exam called resting images, and a second injection is given while you exercise on a treadmill. The Myoview circulates in your bloodstream and is absorbed by your heart while a special camera takes pictures that show whether your heart is receiving enough blood and oxygen.DO NOT TAKE YOUR CARVEDILOL THE MORNING OF TESTING!!   Follow-Up: Your physician wants you to follow-up in: 1 MONTH WITH CRENSHAW -OR- Joni ReiningKATHRYN LAWRENCE, DNP.    [facility] you [facility] choosing [facility]!!

## 2017-04-18 NOTE — Telephone Encounter (Signed)
Encounter complete. 

## 2017-04-20 ENCOUNTER — Ambulatory Visit (HOSPITAL_COMMUNITY)
Admission: RE | Admit: 2017-04-20 | Discharge: 2017-04-20 | Disposition: A | Discharge status: Home / Self Care | Payer: Self-pay | Source: Ambulatory Visit | Attending: Cardiovascular Disease | Admitting: Cardiovascular Disease

## 2017-04-20 DIAGNOSIS — R0609 Other forms of dyspnea: Secondary | ICD-10-CM | POA: Insufficient documentation

## 2017-04-20 DIAGNOSIS — I252 Old myocardial infarction: Secondary | ICD-10-CM | POA: Insufficient documentation

## 2017-04-20 DIAGNOSIS — R079 Chest pain, unspecified: Secondary | ICD-10-CM | POA: Insufficient documentation

## 2017-04-20 DIAGNOSIS — G4733 Obstructive sleep apnea (adult) (pediatric): Secondary | ICD-10-CM | POA: Insufficient documentation

## 2017-04-20 DIAGNOSIS — Z8249 Family history of ischemic heart disease and other diseases of the circulatory system: Secondary | ICD-10-CM | POA: Insufficient documentation

## 2017-04-20 DIAGNOSIS — R5383 Other fatigue: Secondary | ICD-10-CM | POA: Insufficient documentation

## 2017-04-20 DIAGNOSIS — I10 Essential (primary) hypertension: Secondary | ICD-10-CM | POA: Insufficient documentation

## 2017-04-20 DIAGNOSIS — I2510 Atherosclerotic heart disease of native coronary artery without angina pectoris: Secondary | ICD-10-CM | POA: Insufficient documentation

## 2017-04-20 DIAGNOSIS — R42 Dizziness and giddiness: Secondary | ICD-10-CM | POA: Insufficient documentation

## 2017-04-20 LAB — MYOCARDIAL PERFUSION IMAGING
CHL CUP NUCLEAR SDS: 3
CHL CUP NUCLEAR SRS: 1
CHL CUP NUCLEAR SSS: 4
CHL CUP STRESS STAGE 1 DBP: 103 mmHg
CHL CUP STRESS STAGE 1 GRADE: 0 %
CHL CUP STRESS STAGE 1 HR: 54 {beats}/min
CHL CUP STRESS STAGE 1 SBP: 172 mmHg
CHL CUP STRESS STAGE 2 GRADE: 0 %
CHL CUP STRESS STAGE 3 GRADE: 0.1 %
CHL CUP STRESS STAGE 3 SPEED: 1 mph
CHL CUP STRESS STAGE 4 DBP: 97 mmHg
CHL CUP STRESS STAGE 4 GRADE: 10 %
CHL CUP STRESS STAGE 4 HR: 103 {beats}/min
CHL CUP STRESS STAGE 5 HR: 117 {beats}/min
CHL CUP STRESS STAGE 5 SBP: 166 mmHg
CHL CUP STRESS STAGE 5 SPEED: 2.5 mph
CHL CUP STRESS STAGE 6 DBP: 110 mmHg
CHL CUP STRESS STAGE 6 GRADE: 14 %
CHL CUP STRESS STAGE 6 SBP: 182 mmHg
CHL CUP STRESS STAGE 7 SBP: 211 mmHg
CHL CUP STRESS STAGE 8 SPEED: 0 mph
CSEPED: 10 min
CSEPEW: 10.1 METS
CSEPHR: 90 %
CSEPPHR: 155 {beats}/min
Exercise duration (sec): 1 s
LVDIAVOL: 81 mL (ref 46–106)
LVSYSVOL: 28 mL
MPHR: 172 {beats}/min
NUC STRESS TID: 0.89
Peak BP: 182 mmHg
Percent of predicted max HR: 90 %
RPE: 18
Rest HR: 51 {beats}/min
Stage 1 Speed: 0 mph
Stage 2 HR: 54 {beats}/min
Stage 2 Speed: 1 mph
Stage 3 HR: 54 {beats}/min
Stage 4 SBP: 184 mmHg
Stage 4 Speed: 1.7 mph
Stage 5 DBP: 101 mmHg
Stage 5 Grade: 12 %
Stage 6 HR: 155 {beats}/min
Stage 6 Speed: 3.4 mph
Stage 7 DBP: 102 mmHg
Stage 7 Grade: 0 %
Stage 7 HR: 123 {beats}/min
Stage 7 Speed: 0 mph
Stage 8 DBP: 101 mmHg
Stage 8 Grade: 0 %
Stage 8 HR: 84 {beats}/min
Stage 8 SBP: 181 mmHg

## 2017-04-20 MED ORDER — TECHNETIUM TC 99M TETROFOSMIN IV KIT
30.9000 | PACK | Freq: Once | INTRAVENOUS | Status: AC | PRN
  Administered 2017-04-20: 30.9 via INTRAVENOUS
  Filled 2017-04-20: qty 31

## 2017-04-20 MED ORDER — TECHNETIUM TC 99M TETROFOSMIN IV KIT
10.6000 | PACK | Freq: Once | INTRAVENOUS | Status: AC | PRN
  Administered 2017-04-20: 10.6 via INTRAVENOUS
  Filled 2017-04-20: qty 11

## 2017-04-23 ENCOUNTER — Telehealth: Payer: Self-pay | Admitting: Cardiology

## 2017-04-23 NOTE — Telephone Encounter (Signed)
New message ° °Pt verbalized that she is returning call for RN °

## 2017-04-23 NOTE — Telephone Encounter (Signed)
Returning a call . Thanks  °

## 2017-04-24 NOTE — Telephone Encounter (Signed)
See result note.  

## 2017-05-03 NOTE — Progress Notes (Signed)
HPI: FU coronary artery disease. Admitted April 2018 and ruled in for a non-ST elevation myocardial infarction. CTA showed no pulmonary embolus. There was a thyroid nodule. Thyroid ultrasound showed benign mixed cystic and solid nodule in the right mid gland and no further follow-up recommended. Cardiac catheterization revealed an occluded second marginal but no other obstructive disease noted. LV function normal. Patient had PCI of her obtuse marginal with drug-eluting stent. Echocardiogram April 2018 showed normal LV function, mild mitral regurgitation. Nuclear study 1/19 showed EF 66 and normal perfusion. Since last seen,  she has occasional chest pain.  It is in the left breast area and lasts 30 seconds to 1 minute.  It is not exertional.  No associated symptoms.  Resolves spontaneously.  Current Outpatient Medications  Medication Sig Dispense Refill  . acetaminophen (TYLENOL) 325 MG tablet Take 2 tablets (650 mg total) by mouth every 4 (four) hours as needed for headache or mild pain.    Marland Kitchen. aspirin 81 MG chewable tablet Chew 1 tablet (81 mg total) by mouth daily.    Marland Kitchen. atorvastatin (LIPITOR) 80 MG tablet TAKE 1 TABLET BY MOUTH DAILY AT 6 PM. 30 tablet 6  . carvedilol (COREG) 25 MG tablet TAKE 1 TABLET BY MOUTH 2 TIMES DAILY WITH A MEAL. 60 tablet 6  . chlorthalidone (HYGROTON) 25 MG tablet Take 0.5 tablets (12.5 mg total) by mouth daily. 15 tablet 3  . hydrALAZINE (APRESOLINE) 100 MG tablet Take 1 tablet (100 mg total) 3 (three) times daily by mouth. 90 tablet 6  . losartan (COZAAR) 100 MG tablet Take 1 tablet (100 mg total) daily by mouth. 30 tablet 6  . nitroGLYCERIN (NITROSTAT) 0.4 MG SL tablet Place 1 tablet (0.4 mg total) under the tongue every 5 (five) minutes x 3 doses as needed for chest pain. 25 tablet 4  . pantoprazole (PROTONIX) 20 MG tablet Take 1 tablet (20 mg total) by mouth daily. 30 tablet 3  . ticagrelor (BRILINTA) 90 MG TABS tablet Take 1 tablet (90 mg total) 2 (two) times  daily by mouth. 60 tablet 6   No current facility-administered medications for this visit.      Past Medical History:  Diagnosis Date  . Allergy   . CAD in native artery 07/16/2016  . Coronary artery disease   . Hyperlipidemia   . Hypertension   . Migraine    "4-5/week" (07/14/2016)  . NSTEMI (non-ST elevated myocardial infarction) (HCC) 07/13/2016  . S/P angioplasty with stent 07/14/16 with DES to OM2 07/16/2016  . Sleep apnea   . Thyroid nodule 07/16/2016    Past Surgical History:  Procedure Laterality Date  . CORONARY ANGIOPLASTY WITH STENT PLACEMENT  07/14/2016  . CORONARY STENT INTERVENTION N/A 07/14/2016   Procedure: Coronary Stent Intervention;  Surgeon: Corky CraftsJayadeep S Varanasi, MD;  [facility];  Service: Cardiovascular;  Laterality: N/A;  . essure  April 2013   GYN- Dr. Algie CofferFogelman  . LEFT HEART CATH AND CORONARY ANGIOGRAPHY N/A 07/14/2016   Procedure: Left Heart Cath and Coronary Angiography;  Surgeon: Corky CraftsJayadeep S Varanasi, MD;  [facility];  Service: Cardiovascular;  Laterality: N/A;    Social History   Socioeconomic History  . Marital status: Married    Spouse name: Not on file  . Number of children: Not on file  . Years of education: Not on file  . Highest education level: Not on file  Social Needs  . Financial resource strain: Not on file  .  Food insecurity - worry: Not on file  . Food insecurity - inability: Not on file  . Transportation needs - medical: Not on file  . Transportation needs - non-medical: Not on file  Occupational History  . Not on file  Tobacco Use  . Smoking status: Never Smoker  . Smokeless tobacco: Never Used  Substance and Sexual Activity  . Alcohol use: Yes    Comment: 07/14/2016 "maybe 2 mixed drinks/month"  . Drug use: No  . Sexual activity: Yes    Birth control/protection: IUD  Other Topics Concern  . Not on file  Social History Narrative  . Not on file    Family History  Problem Relation Age of Onset    . Diabetes Mother   . Heart attack Mother   . Heart disease Mother   . Hypertension Father   . Heart attack Father   . Heart disease Father   . Diabetes Brother     ROS: no fevers or chills, productive cough, hemoptysis, dysphasia, odynophagia, melena, hematochezia, dysuria, hematuria, rash, seizure activity, orthopnea, PND, pedal edema, claudication. Remaining systems are negative.  Physical Exam: Well-developed well-nourished in no acute distress.  Skin is warm and dry.  HEENT is normal.  Neck is supple.  Chest is clear to auscultation with normal expansion.  Cardiovascular exam is regular rate and rhythm.  Abdominal exam nontender or distended. No masses palpated. Extremities show no edema. neuro grossly intact  ECG-sinus rhythm at a rate of 77.  No ST changes.  Personally reviewed  A/P  1 coronary artery disease-continue aspirin, statin, Brilinta and metoprolol.  Discontinue Brilinta May 2019.  Chest pain is atypical.  Electrocardiogram shows no ST changes.  Nuclear study shows no ischemia.  Plan medical therapy at this point.  2 hypertension-blood pressure controlled.  Continue present medications.    3 hyperlipidemia-continue statin.  Olga Millers, MD

## 2017-05-14 ENCOUNTER — Encounter: Payer: Self-pay | Admitting: Cardiology

## 2017-05-14 ENCOUNTER — Ambulatory Visit (INDEPENDENT_AMBULATORY_CARE_PROVIDER_SITE_OTHER): Payer: Self-pay | Admitting: Cardiology

## 2017-05-14 VITALS — BP 103/63 | HR 77 | Ht 61.0 in | Wt 176.4 lb

## 2017-05-14 DIAGNOSIS — I10 Essential (primary) hypertension: Secondary | ICD-10-CM

## 2017-05-14 DIAGNOSIS — R072 Precordial pain: Secondary | ICD-10-CM

## 2017-05-14 DIAGNOSIS — I2510 Atherosclerotic heart disease of native coronary artery without angina pectoris: Secondary | ICD-10-CM

## 2017-05-14 DIAGNOSIS — E7800 Pure hypercholesterolemia, unspecified: Secondary | ICD-10-CM

## 2017-05-14 NOTE — Patient Instructions (Signed)
Medication Instructions:   STOP BRILINTA MAY 1ST  Follow-Up:  Your physician wants you to follow-up in: 6 MONTHS WITH DR Jens SomRENSHAW You will receive a reminder letter in the mail two months in advance. If you don't receive a letter, please call our office to schedule the follow-up appointment.   If you need a refill on your cardiac medications before your next appointment, please call your pharmacy.

## 2017-05-23 ENCOUNTER — Ambulatory Visit: Payer: Self-pay | Admitting: Adult Health

## 2017-05-25 MED FILL — PANTOPRAZOLE SOD DR 20 MG T: 20 | 30 days supply | Qty: 30 | Fill #1

## 2017-05-25 MED FILL — ?CARVEDILOL 25 MG TABLET: 25 | 30 days supply | Qty: 60 | Fill #2

## 2017-05-25 MED FILL — ?CHLORTHALIDONE 25 MG TABLE: 25 | 30 days supply | Qty: 15 | Fill #1

## 2017-05-25 MED FILL — $BRILINTA 90 MG TABLET: 90 | 30 days supply | Qty: 60 | Fill #3 | Status: TO

## 2017-05-25 MED FILL — hydrALAZINE HCL 100 MG TABS: 100 | 30 days supply | Qty: 90 | Fill #2

## 2017-05-25 MED FILL — LOSARTAN POTASSIUM 100 MG T: 100 | 30 days supply | Qty: 30 | Fill #2

## 2017-05-25 MED FILL — ATORVASTATIN 80 MG TABLET: 80 | 30 days supply | Qty: 30 | Fill #2

## 2017-05-27 NOTE — Progress Notes (Signed)
Cardiology Office Note   Date:  05/29/2017   ID:  Jennifer Diaz, DOB Jul 05, 1970, MRN [id number]  PCP:  Hoy Register, MD  Cardiologist: Jens Som Chief Complaint  Patient presents with  . Follow-up  . Chest Pain     History of Present Illness: Jennifer Diaz is a 47 y.o. female who presents for ongoing assessment and management of coronary artery disease.  Cardiac catheterization April in 2018 revealed an occluded second marginal but no other obstructive disease was noted.  LV function was normal.  The patient subsequently had a PCI of her obtuse marginal with drug-eluting stent.  A repeat nuclear medicine study in the setting of recurrent chest discomfort revealed an EF of 66% with normal perfusion.  When last seen in the office on 05/14/2017, the patient continued to have some discomfort in her left chest in her breast area.  When seen last by Dr. Jens Som, she was to be continued on medical therapy blood pressure is well controlled and no medications were changed.  Here today with continuing discomfort in her left breast which she is now leaning to be stress related. Often he'll pain when she becomes very anxious. Becoming more mindful of her stressors and triggers. She is due to follow-up with her PCP concerning management strategies.  Past Medical History:  Diagnosis Date  . Allergy   . CAD in native artery 07/16/2016  . Coronary artery disease   . Hyperlipidemia   . Hypertension   . Migraine    "4-5/week" (07/14/2016)  . NSTEMI (non-ST elevated myocardial infarction) (HCC) 07/13/2016  . S/P angioplasty with stent 07/14/16 with DES to OM2 07/16/2016  . Sleep apnea   . Thyroid nodule 07/16/2016    Past Surgical History:  Procedure Laterality Date  . CORONARY ANGIOPLASTY WITH STENT PLACEMENT  07/14/2016  . CORONARY STENT INTERVENTION N/A 07/14/2016   Procedure: Coronary Stent Intervention;  Surgeon: Corky Crafts, MD;  Location: [facility] INVASIVE CV LAB;  Service: Cardiovascular;   Laterality: N/A;  . essure  April 2013   GYN- Dr. Algie Coffer  . LEFT HEART CATH AND CORONARY ANGIOGRAPHY N/A 07/14/2016   Procedure: Left Heart Cath and Coronary Angiography;  Surgeon: Corky Crafts, MD;  Location: [facility] INVASIVE CV LAB;  Service: Cardiovascular;  Laterality: N/A;     Current Outpatient Medications  Medication Sig Dispense Refill  . acetaminophen (TYLENOL) 325 MG tablet Take 2 tablets (650 mg total) by mouth every 4 (four) hours as needed for headache or mild pain.    Marland Kitchen aspirin 81 MG chewable tablet Chew 1 tablet (81 mg total) by mouth daily.    Marland Kitchen atorvastatin (LIPITOR) 80 MG tablet TAKE 1 TABLET BY MOUTH DAILY AT 6 PM. 30 tablet 6  . carvedilol (COREG) 25 MG tablet TAKE 1 TABLET BY MOUTH 2 TIMES DAILY WITH A MEAL. 60 tablet 6  . chlorthalidone (HYGROTON) 25 MG tablet Take 0.5 tablets (12.5 mg total) by mouth daily. 15 tablet 3  . hydrALAZINE (APRESOLINE) 100 MG tablet Take 1 tablet (100 mg total) 3 (three) times daily by mouth. 90 tablet 6  . losartan (COZAAR) 100 MG tablet Take 1 tablet (100 mg total) daily by mouth. 30 tablet 6  . nitroGLYCERIN (NITROSTAT) 0.4 MG SL tablet Place 1 tablet (0.4 mg total) under the tongue every 5 (five) minutes x 3 doses as needed for chest pain. 25 tablet 4  . pantoprazole (PROTONIX) 20 MG tablet Take 1 tablet (20 mg total) by mouth daily. 30 tablet 3  .  ticagrelor (BRILINTA) 90 MG TABS tablet Take 1 tablet (90 mg total) 2 (two) times daily by mouth. 60 tablet 6   No current facility-administered medications for this visit.     Allergies:   Aleve [naproxen sodium]; Lisinopril; and Norvasc [amlodipine besylate]    Social History:  The patient  reports that  has never smoked. she has never used smokeless tobacco. She reports that she drinks alcohol. She reports that she does not use drugs.   Family History:  The patient's family history includes Diabetes in her brother and mother; Heart attack in her father and mother; Heart disease in her  father and mother; Hypertension in her father.    ROS: All other systems are reviewed and negative. Unless otherwise mentioned in H&P    PHYSICAL EXAM: VS:  BP 124/84   Pulse (!) 57   Ht 5\' 1"  (1.549 m)   Wt 181 lb 3.2 oz (82.2 kg)   BMI 34.24 kg/m  , BMI Body mass index is 34.24 kg/m. GEN: Well nourished, well developed, in no acute distress  HEENT: normal  Neck: no JVD, carotid bruits, or masses Cardiac: RRR; no murmurs, rubs, or gallops,no edema  Respiratory:  clear to auscultation bilaterally, normal work of breathing GI: soft, nontender, nondistended, + BS MS: no deformity or atrophy  Skin: warm and dry, no rash Neuro:  Strength and sensation are intact Psych: euthymic mood, full affect   Recent Labs: 07/14/2016: Magnesium 1.8; TSH 1.893 07/16/2016: Hemoglobin 14.1; Platelets 319 02/27/2017: ALT 26; BUN 8; Creatinine, Ser 0.82; Potassium 4.4; Sodium 141    Lipid Panel    Component Value Date/Time   CHOL 142 02/27/2017 0951   TRIG 76 02/27/2017 0951   HDL 66 02/27/2017 0951   CHOLHDL 2.2 02/27/2017 0951   CHOLHDL 4.4 07/14/2016 0157   VLDL 49 (H) 07/14/2016 0157   LDLCALC 61 02/27/2017 0951      Wt Readings from Last 3 Encounters:  05/29/17 181 lb 3.2 oz (82.2 kg)  05/14/17 176 lb 6.4 oz (80 kg)  04/20/17 174 lb (78.9 kg)      Other studies Reviewed:  ASSESSMENT AND PLAN: Cardiac Cath 07/14/2016  Mid RCA lesion, 10 %stenosed.  Dist Cx lesion, 10 %stenosed.  Mid LAD lesion, 10 %stenosed.  2nd Mrg lesion, 100 %stenosed. THis was the culprit for her NSTEMI.  A STENT PROMUS PREM MR 2.25X12 drug eluting stent was successfully placed, postdilated to 2.5 mm.  Post intervention, there is a 0% residual stenosis.  The left ventricular systolic function is normal.  LV end diastolic pressure is mildly elevated.  The left ventricular ejection fraction is 55-65% by visual estimate.  There is no aortic valve stenosis.  LVEDP 22 mm Hg.   ASSESSMENT AND  PLAN  1.  Chronic chest pain: She has been ruled out for cardiac etiology of discomfort that time. The patient is now finding that her chest discomfort is related to stress and anxiety. She states that when she becomes upset or anxious she notes discomfort in her left breast. He is trying to become better and managing stress. Her prenatal literature concerning stress management. She is to see her PCP later in the concerning other options to control stress.  2. Coronary artery disease: Stent to the second marginal. She remains on DAPT therapy. She will stop Brilinta in May 2019.  3. Hypertension: Well-controlled on her current medication regimen. Will continue losartan, chlorthalidone, carvedilol, no changes to her regimen.  4. Hypercholesterolemia: Statin therapy. Follow-up  labs in 6 months  Current medicines are reviewed at length with the patient today.    Labs/ tests ordered today include:  Bettey MareKathryn M. Liborio NixonLawrence DNP, ANP, AACC   05/29/2017 8:09 AM    [facility] [address], [place], [place] [address] Phone: [phone]; Fax: [phone]

## 2017-05-29 ENCOUNTER — Ambulatory Visit (INDEPENDENT_AMBULATORY_CARE_PROVIDER_SITE_OTHER): Payer: Self-pay | Admitting: Adult Health

## 2017-05-29 ENCOUNTER — Encounter: Payer: Self-pay | Admitting: Adult Health

## 2017-05-29 ENCOUNTER — Encounter: Payer: Self-pay | Admitting: Family Medicine

## 2017-05-29 ENCOUNTER — Ambulatory Visit: Payer: Self-pay | Attending: Family Medicine | Admitting: Family Medicine

## 2017-05-29 VITALS — BP 136/72 | HR 59 | Temp 98.0°F | Ht 61.0 in | Wt 182.2 lb

## 2017-05-29 VITALS — BP 124/84 | HR 57 | Ht 61.0 in | Wt 181.2 lb

## 2017-05-29 DIAGNOSIS — R079 Chest pain, unspecified: Secondary | ICD-10-CM

## 2017-05-29 DIAGNOSIS — I10 Essential (primary) hypertension: Secondary | ICD-10-CM

## 2017-05-29 DIAGNOSIS — Z7982 Long term (current) use of aspirin: Secondary | ICD-10-CM | POA: Insufficient documentation

## 2017-05-29 DIAGNOSIS — Z955 Presence of coronary angioplasty implant and graft: Secondary | ICD-10-CM | POA: Insufficient documentation

## 2017-05-29 DIAGNOSIS — I252 Old myocardial infarction: Secondary | ICD-10-CM | POA: Insufficient documentation

## 2017-05-29 DIAGNOSIS — I2510 Atherosclerotic heart disease of native coronary artery without angina pectoris: Secondary | ICD-10-CM

## 2017-05-29 DIAGNOSIS — F419 Anxiety disorder, unspecified: Secondary | ICD-10-CM

## 2017-05-29 DIAGNOSIS — Z79899 Other long term (current) drug therapy: Secondary | ICD-10-CM | POA: Insufficient documentation

## 2017-05-29 DIAGNOSIS — E7800 Pure hypercholesterolemia, unspecified: Secondary | ICD-10-CM

## 2017-05-29 DIAGNOSIS — R739 Hyperglycemia, unspecified: Secondary | ICD-10-CM

## 2017-05-29 DIAGNOSIS — Z7902 Long term (current) use of antithrombotics/antiplatelets: Secondary | ICD-10-CM | POA: Insufficient documentation

## 2017-05-29 DIAGNOSIS — Z6834 Body mass index (BMI) 34.0-34.9, adult: Secondary | ICD-10-CM | POA: Insufficient documentation

## 2017-05-29 DIAGNOSIS — Z124 Encounter for screening for malignant neoplasm of cervix: Secondary | ICD-10-CM

## 2017-05-29 DIAGNOSIS — N939 Abnormal uterine and vaginal bleeding, unspecified: Secondary | ICD-10-CM

## 2017-05-29 DIAGNOSIS — E785 Hyperlipidemia, unspecified: Secondary | ICD-10-CM | POA: Insufficient documentation

## 2017-05-29 DIAGNOSIS — R072 Precordial pain: Secondary | ICD-10-CM | POA: Insufficient documentation

## 2017-05-29 DIAGNOSIS — R635 Abnormal weight gain: Secondary | ICD-10-CM

## 2017-05-29 DIAGNOSIS — G4730 Sleep apnea, unspecified: Secondary | ICD-10-CM | POA: Insufficient documentation

## 2017-05-29 NOTE — Progress Notes (Signed)
Subjective:  Patient ID: Jennifer Diaz, female    DOB: 1970/08/18  Age: 47 y.o. MRN: [id number]  CC: Hypertension   HPI Jennifer Diaz is a 47 year old female with a history of hypertension, and STEMI status post drug-eluting stent in 07/2016 (currently on dual antiplatelet therapy with Brilinta and aspirin) who presents today for follow-up visit of hypertension. She underwent a nuclear stress test last month due to ongoing precordial chest pain and study was a low risk study with no ST segment deviation, EF 66%.  Seen by cardiology this morning with no change in her management with plans to discontinue Brilinta in 08/2017; chest pain was thought to be secondary to underlying anxiety and stressors.  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 She denies depression no suicidal ideations.  She is concerned of weight gain - has gained 16 pounds in the last 7 months.  She has also noticed intermittent passing of clots during her periods which lasted 2/4-05/17/17 but prior to that she had been without a period for 3 months and now continues to spot ever since her period ended.  She denies abdominal cramp but does have low back pain. She has associated fatigue. She also has questions about her thyroid nodule seen on prior imaging.  I have reviewed her thyroid ultrasound with her.  Ultrasound of the thyroid 09/13/16: IMPRESSION: Sonographically benign mixed cystic and solid nodule in the right mid gland. No further follow-up required.  Past Medical History:  Diagnosis Date  . Allergy   . CAD in native artery 07/16/2016  . Coronary artery disease   . Hyperlipidemia   . Hypertension   . Migraine    "4-5/week" (07/14/2016)  . NSTEMI (non-ST elevated myocardial infarction) (HCC) 07/13/2016  . S/P angioplasty with  stent 07/14/16 with DES to OM2 07/16/2016  . Sleep apnea   . Thyroid nodule 07/16/2016    Outpatient Medications Prior to Visit  Medication Sig Dispense Refill  . aspirin 81 MG chewable tablet Chew 1 tablet (81 mg total) by mouth daily.    Marland Kitchen atorvastatin (LIPITOR) 80 MG tablet TAKE 1 TABLET BY MOUTH DAILY AT 6 PM. 30 tablet 6  . carvedilol (COREG) 25 MG tablet TAKE 1 TABLET BY MOUTH 2 TIMES DAILY WITH A MEAL. 60 tablet 6  . chlorthalidone (HYGROTON) 25 MG tablet Take 0.5 tablets (12.5 mg total) by mouth daily. 15 tablet 3  . hydrALAZINE (APRESOLINE) 100 MG tablet Take 1 tablet (100 mg total) 3 (three) times daily by mouth. 90 tablet 6  . losartan (COZAAR) 100 MG tablet Take 1 tablet (100 mg total) daily by mouth. 30 tablet 6  . nitroGLYCERIN (NITROSTAT) 0.4 MG SL tablet Place 1 tablet (0.4 mg total) under the tongue every 5 (five) minutes x 3 doses as needed for chest pain. 25 tablet 4  . pantoprazole (PROTONIX) 20 MG tablet Take 1 tablet (20 mg total) by mouth daily. 30 tablet 3  . ticagrelor (BRILINTA) 90 MG TABS tablet Take 1 tablet (90 mg total) 2 (two) times daily by mouth. 60 tablet 6  . acetaminophen (TYLENOL) 325 MG tablet Take 2 tablets (650 mg total) by mouth every 4 (four) hours as needed for headache or mild pain. (Patient not taking: Reported on 05/29/2017)     No facility-administered medications prior to  visit.     ROS Review of Systems  Constitutional: Positive for fatigue. Negative for activity change and appetite change.  HENT: Negative for congestion, sinus pressure and sore throat.   Eyes: Negative for visual disturbance.  Respiratory: Negative for cough, chest tightness, shortness of breath and wheezing.   Cardiovascular: Negative for chest pain and palpitations.  Gastrointestinal: Negative for abdominal distention, abdominal pain and constipation.  Endocrine: Negative for polydipsia.  Genitourinary: Positive for menstrual problem. Negative for dysuria and frequency.    Musculoskeletal: Negative for arthralgias and back pain.  Skin: Negative for rash.  Neurological: Negative for tremors, light-headedness and numbness.  Hematological: Does not bruise/bleed easily.  Psychiatric/Behavioral: Negative for agitation and behavioral problems.       Positive for anxiety    Objective:  BP 136/72   Pulse (!) 59   Temp 98 F (36.7 C) (Oral)   Ht 5\' 1"  (1.549 m)   Wt 182 lb 3.2 oz (82.6 kg)   SpO2 99%   BMI 34.43 kg/m   BP/Weight 05/29/2017 05/29/2017 05/14/2017  Systolic BP 124 136 103  Diastolic BP 84 72 63  Wt. (Lbs) 181.2 182.2 176.4  BMI 34.24 34.43 33.33      Physical Exam  Constitutional: She is oriented to person, place, and time. She appears well-developed and well-nourished.  Cardiovascular: Normal heart sounds and intact distal pulses. Bradycardia present.  No murmur heard. Pulmonary/Chest: Effort normal and breath sounds normal. She has no wheezes. She has no rales. She exhibits no tenderness.  Abdominal: Soft. Bowel sounds are normal. She exhibits no distension and no mass. There is no tenderness.  Genitourinary:  Genitourinary Comments: Normal external genitalia and vagina, brownish discharge from cervical os, normal adnexa.  Musculoskeletal: Normal range of motion.  Neurological: She is alert and oriented to person, place, and time.  Psychiatric: She has a normal mood and affect.    CMP Latest Ref Rng & Units 02/27/2017 07/16/2016 07/15/2016  Glucose 65 - 99 mg/dL 97 045(W116(H) 098(J110(H)  BUN 6 - 24 mg/dL 8 12 6   Creatinine 0.57 - 1.00 mg/dL 1.910.82 4.780.69 2.950.69  Sodium 134 - 144 mmol/L 141 135 136  Potassium 3.5 - 5.2 mmol/L 4.4 3.9 3.4(L)  Chloride 96 - 106 mmol/L 105 105 102  CO2 20 - 29 mmol/L 24 22 22   Calcium 8.7 - 10.2 mg/dL 9.1 8.9 9.0  Total Protein 6.0 - 8.5 g/dL 6.8 - -  Total Bilirubin 0.0 - 1.2 mg/dL 0.6 - -  Alkaline Phos 39 - 117 IU/L 103 - -  AST 0 - 40 IU/L 19 - -  ALT 0 - 32 IU/L 26 - -    Lipid Panel     Component Value  Date/Time   CHOL 142 02/27/2017 0951   TRIG 76 02/27/2017 0951   HDL 66 02/27/2017 0951   CHOLHDL 2.2 02/27/2017 0951   CHOLHDL 4.4 07/14/2016 0157   VLDL 49 (H) 07/14/2016 0157   LDLCALC 61 02/27/2017 0951    EXAM: THYROID ULTRASOUND  TECHNIQUE: Ultrasound examination of the thyroid gland and adjacent soft tissues was performed.  COMPARISON:  CT scan of the chest 07/14/2016  FINDINGS: Parenchymal Echotexture: Normal  Isthmus: 0.3 cm  Right lobe: 4.1 x 2.0 x 1.9 cm  Left lobe: 3.4 x 1.8 x 1.4 cm  _________________________________________________________  Estimated total number of nodules >/= 1 cm: 1  Number of spongiform nodules >/=  2 cm not described below (TR1): 0  Number of mixed cystic and solid  nodules >/= 1.5 cm not described below (TR2): 0  _________________________________________________________  Nodule # 1:  Location: Right; Mid  Maximum size: 1.6 cm; Other 2 dimensions: 1.3 x 1.5 cm  Composition: mixed cystic and solid (1)  Echogenicity: hyperechoic (1)  Shape: not taller-than-wide (0)  Margins: ill-defined (0)  Echogenic foci: none (0)  ACR TI-RADS total points: 2.  ACR TI-RADS risk category: TR2 (2 points).  ACR TI-RADS recommendations:  This nodule does NOT meet TI-RADS criteria for biopsy or dedicated follow-up.  ________________________________________________________  IMPRESSION: Sonographically benign mixed cystic and solid nodule in the right mid gland. No further follow-up required.  The above is in keeping with the ACR TI-RADS recommendations - J Am Coll Radiol 2017;14:587-595.   Electronically Signed   By: Malachy Moan M.D.   On: 09/13/2016 14:45  Assessment & Plan:   1. Essential hypertension Controlled Counseled on blood pressure goal of less than 130/80, low-sodium, DASH diet, medication compliance, 150 minutes of moderate intensity exercise per week. Discussed medication  compliance, adverse effects. Continue losartan, chlorthalidone, hydralazine  2. CAD in native artery Status post DES in 07/2016 Intermittent precordial chest pain thought to be secondary to anxiety Continue Brilinta and ASA and discontinue Brilinta in 08/2017  3. Abnormal uterine bleeding She could be perimenopausal Will order pelvic ultrasound to exclude fibroids - CBC with Differential/Platelet - US PELVIC COMPLETE W TRANSVAGINAL AND TORSION R/O; Future - FSH/LH - Estradiol  4. Anxiety Secondary to stressors She declines counseling and pharmacotherapy at this time  5. Weight gain Could be possibly related to decreased activity and Encouraged to reduce portion sizes, increase physical activity, avoid late meals - TSH  6. Hyperglycemia - Hemoglobin A1c  7. Screening for cervical cancer - Cytology - PAP(Kathleen)   No orders of the defined types were placed in this encounter.   Follow-up: Return in about 3 months (around 08/26/2017) for follow up chronic medical conditions.   Hoy Register MD

## 2017-05-29 NOTE — Patient Instructions (Addendum)
Medication Instructions:  NO CHANGES-STOP BRILINTA IN MAY, AS DISCUSSED   If you need a refill on your cardiac medications before your next appointment, please call your pharmacy.   Follow-Up: Your physician wants you to follow-up in: [place] as planned. You should receive a reminder letter in the mail two months in advance. If you do not receive a letter, please call our office June 2019 to schedule the AUGUST 2019 follow-up appointment.   Thank you for choosing [facility] at [facility]!!     Reducing Stress and Stress Management Stress is a normal reaction to life events. It is what you feel when life demands more than you are used to or more than you can handle. Some stress can be useful. For example, the stress reaction can help you catch the last bus of the day, study for a test, or meet a deadline at work. But stress that occurs too often or for too long can cause problems. It can affect your emotional health and interfere with relationships and normal daily activities. Too much stress can weaken your immune system and increase your risk for physical illness. If you already have a medical problem, stress can make it worse. What are the causes? All sorts of life events may cause stress. An event that causes stress for one person may not be stressful for another person. Major life events commonly cause stress. These may be positive or negative. Examples include losing your job, moving into a new home, getting married, having a baby, or losing a loved one. Less obvious life events may also cause stress, especially if they occur day after day or in combination. Examples include working long hours, driving in traffic, caring for children, being in debt, or being in a difficult relationship. What are the signs or symptoms? Stress may cause emotional symptoms including, the following:  Anxiety. This is feeling worried, afraid, on edge, overwhelmed, or out of  control.  Anger. This is feeling irritated or impatient.  Depression. This is feeling sad, down, helpless, or guilty.  Difficulty focusing, remembering, or making decisions.  Stress may cause physical symptoms, including the following:  Aches and pains. These may affect your head, neck, back, stomach, or other areas of your body.  Tight muscles or clenched jaw.  Low energy or trouble sleeping.  Stress may cause unhealthy behaviors, including the following:  Eating to feel better (overeating) or skipping meals.  Sleeping too little, too much, or both.  Working too much or putting off tasks (procrastination).  Smoking, drinking alcohol, or using drugs to feel better.  How is this diagnosed? Stress is diagnosed through an assessment by your health care provider. Your health care provider will ask questions about your symptoms and any stressful life events.Your health care provider will also ask about your medical history and may order blood tests or other tests. Certain medical conditions and medicine can cause physical symptoms similar to stress. Mental illness can cause emotional symptoms and unhealthy behaviors similar to stress. Your health care provider may refer you to a mental health professional for further evaluation. How is this treated? Stress management is the recommended treatment for stress.The goals of stress management are reducing stressful life events and coping with stress in healthy ways. Techniques for reducing stressful life events include the following:  Stress identification. Self-monitor for stress and identify what causes stress for you. These skills may help you to avoid some stressful events.  Time management. Set your priorities, keep a  calendar of events, and learn to say "no." These tools can help you avoid making too many commitments.  Techniques for coping with stress include the following:  Rethinking the problem. Try to think realistically about  stressful events rather than ignoring them or overreacting. Try to find the positives in a stressful situation rather than focusing on the negatives.  Exercise. Physical exercise can release both physical and emotional tension. The key is to find a form of exercise you enjoy and do it regularly.  Relaxation techniques. These relax the body and mind. Examples include yoga, meditation, tai chi, biofeedback, deep breathing, progressive muscle relaxation, listening to music, being out in nature, journaling, and other hobbies. Again, the key is to find one or more that you enjoy and can do regularly.  Healthy lifestyle. Eat a balanced diet, get plenty of sleep, and do not smoke. Avoid using alcohol or drugs to relax.  Strong support network. Spend time with family, friends, or other people you enjoy being around.Express your feelings and talk things over with someone you trust.  Counseling or talktherapy with a mental health professional may be helpful if you are having difficulty managing stress on your own. Medicine is typically not recommended for the treatment of stress.Talk to your health care provider if you think you need medicine for symptoms of stress. Follow these instructions at home:  Keep all follow-up visits as directed by your health care provider.  Take all medicines as directed by your health care provider. Contact a health care provider if:  Your symptoms get worse or you start having new symptoms.  You feel overwhelmed by your problems and can no longer manage them on your own. Get help right away if:  You feel like hurting yourself or someone else. This information is not intended to replace advice given to you by your health care provider. Make sure you discuss any questions you have with your health care provider. Document Released: 09/20/2000 Document Revised: 09/02/2015 Document Reviewed: 11/19/2012 Elsevier Interactive Patient Education  2017 [employer].

## 2017-05-29 NOTE — Patient Instructions (Signed)

## 2017-05-30 ENCOUNTER — Encounter: Payer: Self-pay | Admitting: Family Medicine

## 2017-05-30 LAB — CBC WITH DIFFERENTIAL/PLATELET
BASOS ABS: 0.1 10*3/uL (ref 0.0–0.2)
Basos: 0 %
EOS (ABSOLUTE): 0.3 10*3/uL (ref 0.0–0.4)
Eos: 2 %
HEMOGLOBIN: 12.7 g/dL (ref 11.1–15.9)
Hematocrit: 37.9 % (ref 34.0–46.6)
Immature Grans (Abs): 0.1 10*3/uL (ref 0.0–0.1)
Immature Granulocytes: 1 %
LYMPHS ABS: 2.7 10*3/uL (ref 0.7–3.1)
Lymphs: 23 %
MCH: 28.1 pg (ref 26.6–33.0)
MCHC: 33.5 g/dL (ref 31.5–35.7)
MCV: 84 fL (ref 79–97)
Monocytes Absolute: 0.6 10*3/uL (ref 0.1–0.9)
Monocytes: 5 %
NEUTROS ABS: 8.2 10*3/uL — AB (ref 1.4–7.0)
Neutrophils: 69 %
PLATELETS: 260 10*3/uL (ref 150–379)
RBC: 4.52 x10E6/uL (ref 3.77–5.28)
RDW: 14.6 % (ref 12.3–15.4)
WBC: 11.9 10*3/uL — AB (ref 3.4–10.8)

## 2017-05-30 LAB — HEMOGLOBIN A1C
Est. average glucose Bld gHb Est-mCnc: 126 mg/dL
HEMOGLOBIN A1C: 6 % — AB (ref 4.8–5.6)

## 2017-05-30 LAB — FSH/LH
FSH: 4.4 m[IU]/mL
LH: 7.6 m[IU]/mL

## 2017-05-30 LAB — TSH: TSH: 3.24 u[IU]/mL (ref 0.450–4.500)

## 2017-05-30 LAB — ESTRADIOL: Estradiol: 770.7 pg/mL

## 2017-06-01 ENCOUNTER — Encounter: Payer: Self-pay | Admitting: Family Medicine

## 2017-06-01 ENCOUNTER — Ambulatory Visit (HOSPITAL_COMMUNITY)
Admission: RE | Admit: 2017-06-01 | Discharge: 2017-06-01 | Disposition: A | Discharge status: Home / Self Care | Payer: Self-pay | Source: Ambulatory Visit | Attending: Family Medicine | Admitting: Family Medicine

## 2017-06-01 DIAGNOSIS — N939 Abnormal uterine and vaginal bleeding, unspecified: Secondary | ICD-10-CM | POA: Insufficient documentation

## 2017-06-01 DIAGNOSIS — N83201 Unspecified ovarian cyst, right side: Secondary | ICD-10-CM | POA: Insufficient documentation

## 2017-06-01 LAB — CYTOLOGY - PAP
DIAGNOSIS: NEGATIVE
Diagnosis: REACTIVE
HPV: NOT DETECTED

## 2017-06-06 ENCOUNTER — Encounter: Payer: Self-pay | Admitting: Family Medicine

## 2017-06-07 ENCOUNTER — Encounter: Payer: Self-pay | Admitting: Family Medicine

## 2017-06-19 MED FILL — PANTOPRAZOLE SOD DR 20 MG T: 20 | 30 days supply | Qty: 30 | Fill #2

## 2017-06-19 MED FILL — CARVEDILOL 25 MG TABS: 25 | 30 days supply | Qty: 60 | Fill #3

## 2017-06-19 MED FILL — LOSARTAN POTASSIUM 100 MG T: 100 | 30 days supply | Qty: 30 | Fill #3

## 2017-06-19 MED FILL — CHLORTHALIDONE 25 MG TABLET: 25 | 30 days supply | Qty: 15 | Fill #2

## 2017-06-20 ENCOUNTER — Emergency Department (HOSPITAL_COMMUNITY): Payer: Self-pay

## 2017-06-20 ENCOUNTER — Emergency Department (HOSPITAL_COMMUNITY)
Admission: EM | Admit: 2017-06-20 | Discharge: 2017-06-20 | Disposition: A | Discharge status: Home / Self Care | Payer: Self-pay | Attending: Emergency Medicine | Admitting: Emergency Medicine

## 2017-06-20 DIAGNOSIS — Z955 Presence of coronary angioplasty implant and graft: Secondary | ICD-10-CM | POA: Insufficient documentation

## 2017-06-20 DIAGNOSIS — Z7982 Long term (current) use of aspirin: Secondary | ICD-10-CM | POA: Insufficient documentation

## 2017-06-20 DIAGNOSIS — I2510 Atherosclerotic heart disease of native coronary artery without angina pectoris: Secondary | ICD-10-CM | POA: Insufficient documentation

## 2017-06-20 DIAGNOSIS — Z79899 Other long term (current) drug therapy: Secondary | ICD-10-CM | POA: Insufficient documentation

## 2017-06-20 DIAGNOSIS — I10 Essential (primary) hypertension: Secondary | ICD-10-CM | POA: Insufficient documentation

## 2017-06-20 DIAGNOSIS — R42 Dizziness and giddiness: Secondary | ICD-10-CM | POA: Insufficient documentation

## 2017-06-20 LAB — CBC WITH DIFFERENTIAL/PLATELET
BASOS PCT: 1 %
Basophils Absolute: 0.1 10*3/uL (ref 0.0–0.1)
EOS ABS: 0.2 10*3/uL (ref 0.0–0.7)
Eosinophils Relative: 2 %
HEMATOCRIT: 38.5 % (ref 36.0–46.0)
Hemoglobin: 12.8 g/dL (ref 12.0–15.0)
Lymphocytes Relative: 23 %
Lymphs Abs: 2.4 10*3/uL (ref 0.7–4.0)
MCH: 27.5 pg (ref 26.0–34.0)
MCHC: 33.2 g/dL (ref 30.0–36.0)
MCV: 82.8 fL (ref 78.0–100.0)
MONO ABS: 0.1 10*3/uL (ref 0.1–1.0)
Monocytes Relative: 1 %
NEUTROS ABS: 7.6 10*3/uL (ref 1.7–7.7)
Neutrophils Relative %: 73 %
Platelets: 294 10*3/uL (ref 150–400)
RBC: 4.65 MIL/uL (ref 3.87–5.11)
RDW: 13.8 % (ref 11.5–15.5)
WBC: 10.3 10*3/uL (ref 4.0–10.5)

## 2017-06-20 LAB — COMPREHENSIVE METABOLIC PANEL
ALBUMIN: 4.1 g/dL (ref 3.5–5.0)
ALT: 31 U/L (ref 14–54)
AST: 30 U/L (ref 15–41)
Alkaline Phosphatase: 104 U/L (ref 38–126)
Anion gap: 12 (ref 5–15)
BILIRUBIN TOTAL: 1.1 mg/dL (ref 0.3–1.2)
BUN: 14 mg/dL (ref 6–20)
CO2: 22 mmol/L (ref 22–32)
Calcium: 9.7 mg/dL (ref 8.9–10.3)
Chloride: 102 mmol/L (ref 101–111)
Creatinine, Ser: 0.7 mg/dL (ref 0.44–1.00)
GFR calc Af Amer: >60 mL/min (ref >60)
GFR calc non Af Amer: >60 mL/min (ref >60)
GLUCOSE: 200 mg/dL — AB (ref 65–99)
Potassium: 3.7 mmol/L (ref 3.5–5.1)
Sodium: 136 mmol/L (ref 135–145)
TOTAL PROTEIN: 7.8 g/dL (ref 6.5–8.1)

## 2017-06-20 MED ORDER — MECLIZINE HCL 25 MG PO TABS: ORAL_TABLET | ORAL | 0 refills | Status: DC

## 2017-06-20 MED ORDER — LORAZEPAM 2 MG/ML IJ SOLN
INTRAMUSCULAR | Status: AC
  Administered 2017-06-20: 1 mg
  Filled 2017-06-20: qty 1

## 2017-06-20 MED ORDER — ONDANSETRON 4 MG PO TBDP: ORAL_TABLET | ORAL | 0 refills | Status: DC

## 2017-06-20 MED ORDER — HYDROMORPHONE HCL 1 MG/ML IJ SOLN
0.5000 mg | Freq: Once | INTRAMUSCULAR | Status: AC
  Administered 2017-06-20: 0.5 mg via INTRAVENOUS
  Filled 2017-06-20: qty 1

## 2017-06-20 MED ORDER — SODIUM CHLORIDE 0.9 % IV BOLUS (SEPSIS)
500.0000 mL | Freq: Once | INTRAVENOUS | Status: AC
  Administered 2017-06-20: 500 mL via INTRAVENOUS

## 2017-06-20 MED ORDER — MECLIZINE HCL 12.5 MG PO TABS
25.0000 mg | ORAL_TABLET | Freq: Once | ORAL | Status: AC
  Administered 2017-06-20: 25 mg via ORAL
  Filled 2017-06-20: qty 2

## 2017-06-20 MED ORDER — ONDANSETRON HCL 4 MG/2ML IJ SOLN
4.0000 mg | Freq: Once | INTRAMUSCULAR | Status: AC
  Administered 2017-06-20: 4 mg via INTRAVENOUS
  Filled 2017-06-20: qty 2

## 2017-06-20 MED ORDER — SODIUM CHLORIDE 0.9 % IV BOLUS (SEPSIS)
1000.0000 mL | Freq: Once | INTRAVENOUS | Status: AC
  Administered 2017-06-20: 1000 mL via INTRAVENOUS

## 2017-06-20 MED ORDER — DIAZEPAM 5 MG/ML IJ SOLN: 5.0000 mg | Freq: Once | INTRAMUSCULAR | Status: DC

## 2017-06-20 NOTE — ED Notes (Signed)
Pt taken to MRI  

## 2017-06-20 NOTE — ED Notes (Signed)
Still c/o dizziness, family at bedside.

## 2017-06-20 NOTE — ED Notes (Signed)
EDP at bedside  

## 2017-06-20 NOTE — ED Notes (Signed)
Refused, bedpan, bedside commode and purewick

## 2017-06-20 NOTE — ED Provider Notes (Signed)
[facility]GENCY [facility]PART[facility] Provider Note   CSN: [id number] Arrival date & time: 06/20/17  [id number]     History   Chief Complaint Chief Complaint  Patient presents with  . Dizziness  . Emesis    HPI Jennifer Diaz is a 47 y.o. female.  Patient complains of dizziness and nausea.  Patient states that she had a sore throat runny nose a few days ago that has gotten better   The history is provided by the patient. No language interpreter was used.  Dizziness  Quality:  Vertigo, imbalance and room spinning Severity:  Severe Onset quality:  Sudden Timing:  Constant Progression:  Unchanged Chronicity:  New Context: bending over   Associated symptoms: vomiting   Associated symptoms: no chest pain, no diarrhea and no headaches   Emesis   Pertinent negatives include no abdominal pain, no cough, no diarrhea and no headaches.    Past Medical History:  Diagnosis Date  . Allergy   . CAD in native artery 07/16/2016  . Coronary artery disease   . Hyperlipidemia   . Hypertension   . Migraine    "4-5/week" (07/14/2016)  . NSTEMI (non-ST elevated myocardial infarction) (HCC) 07/13/2016  . S/P angioplasty with stent 07/14/16 with DES to OM2 07/16/2016  . Sleep apnea   . Thyroid nodule 07/16/2016    Patient Active Problem List   Diagnosis Date Noted  . Hyperglycemia 07/20/2016  . Thyroid nodule 07/16/2016  . CAD in native artery 07/16/2016  . S/P angioplasty with stent 07/14/16 with DES to OM2 07/16/2016  . NSTEMI (non-ST elevated myocardial infarction) (HCC) 07/14/2016  . Hypokalemia 07/14/2016  . Headache 07/14/2016  . Chest pain   . Migraine headache 01/20/2014  . Acute URI 01/20/2014  . Laceration of hand 08/29/2013  . Neuropathy 08/02/2011  . Fatigue 07/05/2011  . Headache(784.0) 07/05/2011  . Family history of Crohn's disease 07/05/2011  . Hip pain, left 07/03/2011  . Hyperlipidemia 03/21/2009  . Obesity 02/08/2009  . SLEEP APNEA, CHRONIC 02/08/2009  . Essential  hypertension 03/05/2006  . IBS 03/05/2006    Past Surgical History:  Procedure Laterality Date  . CORONARY ANGIOPLASTY WITH STENT PLACEMENT  07/14/2016  . CORONARY STENT INTERVENTION N/A 07/14/2016   Procedure: Coronary Stent Intervention;  Surgeon: Corky CraftsJayadeep S Varanasi, MD;  [facility];  Service: Cardiovascular;  Laterality: N/A;  . essure  April 2013   GYN- Dr. Algie CofferFogelman  . LEFT HEART CATH AND CORONARY ANGIOGRAPHY N/A 07/14/2016   Procedure: Left Heart Cath and Coronary Angiography;  Surgeon: Corky CraftsJayadeep S Varanasi, MD;  [facility];  Service: Cardiovascular;  Laterality: N/A;    OB History    No data available       Home Medications    Prior to Admission medications   Medication Sig Start Date End Date Taking? Authorizing Provider  aspirin 81 MG chewable tablet Chew 1 tablet (81 mg total) by mouth daily. 07/17/16  Yes Leone BrandIngold, Laura R, NP  atorvastatin (LIPITOR) 80 MG tablet TAKE 1 TABLET BY MOUTH DAILY AT 6 PM. 02/27/17  Yes Newlin, Enobong, MD  carvedilol (COREG) 25 MG tablet TAKE 1 TABLET BY MOUTH 2 TIMES DAILY WITH A MEAL. 02/27/17  Yes Hoy RegisterNewlin, Enobong, MD  chlorthalidone (HYGROTON) 25 MG tablet Take 0.5 tablets (12.5 mg total) by mouth daily. 04/18/17  Yes Jodelle GrossLawrence, Kathryn M, NP  hydrALAZINE (APRESOLINE) 100 MG tablet Take 1 tablet (100 mg total) 3 (three) times daily by mouth. 02/27/17  Yes Newlin, Enobong,  MD  losartan (COZAAR) 100 MG tablet Take 1 tablet (100 mg total) daily by mouth. 02/27/17  Yes Newlin, Enobong, MD  pantoprazole (PROTONIX) 20 MG tablet Take 1 tablet (20 mg total) by mouth daily. 04/18/17  Yes Jodelle Gross, NP  ticagrelor (BRILINTA) 90 MG TABS tablet Take 1 tablet (90 mg total) 2 (two) times daily by mouth. 02/27/17  Yes Newlin, Enobong, MD  acetaminophen (TYLENOL) 325 MG tablet Take 2 tablets (650 mg total) by mouth every 4 (four) hours as needed for headache or mild pain. Patient not taking: Reported on 05/29/2017 07/16/16    Leone Brand, NP  meclizine (ANTIVERT) 25 MG tablet Take one pill every 6-8 hours for  dizziness 06/20/17   Bethann Berkshire, MD  nitroGLYCERIN (NITROSTAT) 0.4 MG SL tablet Place 1 tablet (0.4 mg total) under the tongue every 5 (five) minutes x 3 doses as needed for chest pain. 07/16/16   Leone Brand, NP  ondansetron (ZOFRAN ODT) 4 MG disintegrating tablet 4mg  ODT q4 hours prn nausea/vomit 06/20/17   Bethann Berkshire, MD    Family History Family History  Problem Relation Age of Onset  . Diabetes Mother   . Heart attack Mother   . Heart disease Mother   . Hypertension Father   . Heart attack Father   . Heart disease Father   . Diabetes Brother     Social History Social History   Tobacco Use  . Smoking status: Never Smoker  . Smokeless tobacco: Never Used  Substance Use Topics  . Alcohol use: Yes    Comment: 07/14/2016 "maybe 2 mixed drinks/month"  . Drug use: No     Allergies   Aleve [naproxen sodium]; Lisinopril; and Norvasc [amlodipine besylate]   Review of Systems Review of Systems  Constitutional: Negative for appetite change and fatigue.  HENT: Negative for congestion, ear discharge and sinus pressure.   Eyes: Negative for discharge.  Respiratory: Negative for cough.   Cardiovascular: Negative for chest pain.  Gastrointestinal: Positive for vomiting. Negative for abdominal pain and diarrhea.  Genitourinary: Negative for frequency and hematuria.  Musculoskeletal: Negative for back pain.  Skin: Negative for rash.  Neurological: Positive for dizziness. Negative for seizures and headaches.  Psychiatric/Behavioral: Negative for hallucinations.     Physical Exam Updated Vital Signs BP (!) 154/93   Pulse 62   Temp 97.8 F (36.6 C)   Resp 16   Ht 5' (1.524 m)   Wt 84.8 kg (187 lb)   LMP 06/10/2017 (LMP Unknown)   SpO2 94%   BMI 36.52 kg/m   Physical Exam  Constitutional: She is oriented to person, place, and time. She appears well-developed.  HENT:  Head:  Normocephalic.  Eyes: Conjunctivae and EOM are normal. No scleral icterus.  No nystagmus  Neck: Neck supple. No thyromegaly present.  Cardiovascular: Normal rate and regular rhythm. Exam reveals no gallop and no friction rub.  No murmur heard. Pulmonary/Chest: No stridor. She has no wheezes. She has no rales. She exhibits no tenderness.  Abdominal: She exhibits no distension. There is no tenderness. There is no rebound.  Musculoskeletal: Normal range of motion. She exhibits no edema.  Lymphadenopathy:    She has no cervical adenopathy.  Neurological: She is oriented to person, place, and time. She exhibits normal muscle tone. Coordination normal.  Skin: No rash noted. No erythema.  Psychiatric: She has a normal mood and affect. Her behavior is normal.     ED Treatments / Results  Labs (all labs  ordered are listed, but only abnormal results are displayed) Labs Reviewed  COMPREHENSIVE METABOLIC PANEL - Abnormal; Notable for the following components:      Result Value   Glucose, Bld 200 (*)    All other components within normal limits  CBC WITH DIFFERENTIAL/PLATELET    EKG  EKG Interpretation  Date/Time:  Wednesday June 20 2017 10:12:00 EDT Ventricular Rate:  87 PR Interval:    QRS Duration: 92 QT Interval:  403 QTC Calculation: 485 R Axis:   77 Text Interpretation:  Sinus rhythm Confirmed by Bethann Berkshire [phone]) on 06/20/2017 1:09:16 PM       Radiology Ct Head Wo Contrast  Result Date: 06/20/2017 CLINICAL DATA:  Headache and dizziness with nausea EXAM: CT HEAD WITHOUT CONTRAST TECHNIQUE: Contiguous axial images were obtained from the base of the skull through the vertex without intravenous contrast. COMPARISON:  Aug 11, 2013 FINDINGS: Brain: The ventricles are normal in size and configuration. There is no intracranial mass, hemorrhage, extra-axial fluid collection, or midline shift. There is stable patchy small vessel disease in the centra semiovale bilaterally. Compared  to prior study, there is now a small area of decreased attenuation in the anterior right thalamus, felt to represent prior infarct in this area. No acute infarct evident. Vascular: No hyperdense vessel evident. There is calcification in each carotid siphon region. Skull: Bony calvarium appears intact. Sinuses/Orbits: Visualized paranasal sinuses are clear. Orbits appear symmetric bilaterally. Other: Mastoid air cells are clear. IMPRESSION: Patchy periventricular small vessel disease, stable. Prior small infarct in the anterior right thalamus, not present on prior study but nonacute appearing currently. No evident acute infarct. No mass or hemorrhage. Foci of arterial vascular calcification noted. Electronically Signed   By: Bretta Bang III M.D.   On: 06/20/2017 11:35   Mr Brain Wo Contrast  Result Date: 06/20/2017 CLINICAL DATA:  Dizziness and headaches. Altered level of consciousness. EXAM: MRI HEAD WITHOUT CONTRAST TECHNIQUE: Multiplanar, multiecho pulse sequences of the brain and surrounding structures were obtained without intravenous contrast. COMPARISON:  CT same day. FINDINGS: Brain: Diffusion imaging does not show any acute or subacute infarction. The brainstem and cerebellum are normal. There are a few old small vessel infarctions in the anterior right thalamus. Cerebral hemispheres show moderate chronic small-vessel ischemic changes throughout the deep and subcortical white matter, markedly premature for age. No cortical or large vessel territory infarction. No mass lesion, hemorrhage, hydrocephalus or extra-axial collection. Vascular: Major vessels at the base of the brain show flow. Skull and upper cervical spine: Negative Sinuses/Orbits: Clear/normal Other: None IMPRESSION: No acute or reversible finding. 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 Chronic small-vessel ischemic changes affecting the cerebral hemispheric white matter, markedly premature for age. Electronically Signed   By: Paulina Fusi M.D.   On: 06/20/2017 14:15    Procedures Procedures (including critical care time)  Medications Ordered in ED Medications  diazepam (VALIUM) injection 5 mg (not administered)  sodium chloride 0.9 % bolus 1,000 mL (0 mLs Intravenous Stopped 06/20/17 1300)  ondansetron (ZOFRAN) injection 4 mg (4 mg Intravenous Given 06/20/17 1040)  meclizine (ANTIVERT) tablet 25 mg (25 mg Oral Given 06/20/17 1139)  HYDROmorphone (DILAUDID) injection 0.5 mg (0.5 mg Intravenous Given 06/20/17 1224)  ondansetron (ZOFRAN) injection 4 mg (4 mg Intravenous Given 06/20/17 1339)  sodium chloride 0.9 % bolus 500 mL (500 mLs Intravenous New Bag/Given 06/20/17 1339)  LORazepam (ATIVAN) 2 MG/ML injection (1 mg  Given 06/20/17 1339)     Initial Impression / Assessment and Plan /  ED Course  I have reviewed the triage vital signs and the nursing notes.  Pertinent labs & imaging results that were available during my care of the patient were reviewed by me and considered in my medical decision making (see chart for details).   Labs including CBC and chemistries unremarkable except for glucose 200.  CT of the head and MRI of the head did not show any acute problems.  Patient was given Antivert and Ativan to help with the vertigo symptoms.  Patient has improved some and will be discharged home with Antivert Zofran and follow-up with her PCP    Final Clinical Impressions(s) / ED Diagnoses   Final diagnoses:  Vertigo    ED Discharge Orders        Ordered    meclizine (ANTIVERT) 25 MG tablet     06/20/17 1515    ondansetron (ZOFRAN ODT) 4 MG disintegrating tablet     06/20/17 1515       Bethann Berkshire, MD 06/20/17 1519

## 2017-06-20 NOTE — ED Triage Notes (Signed)
Dizziness for 2 days, nausea and vomiting onset today

## 2017-06-20 NOTE — Discharge Instructions (Signed)
Follow-up with your doctor next week for recheck.  Return sooner if problems.  Just rest drink plenty of fluids

## 2017-06-21 ENCOUNTER — Encounter: Payer: Self-pay | Admitting: Family Medicine

## 2017-06-25 ENCOUNTER — Emergency Department (HOSPITAL_COMMUNITY)
Admission: EM | Admit: 2017-06-25 | Discharge: 2017-06-25 | Disposition: A | Discharge status: Home / Self Care | Payer: Self-pay | Attending: Emergency Medicine | Admitting: Emergency Medicine

## 2017-06-25 ENCOUNTER — Emergency Department (HOSPITAL_COMMUNITY): Payer: Self-pay

## 2017-06-25 ENCOUNTER — Encounter (HOSPITAL_COMMUNITY): Payer: Self-pay | Admitting: Emergency Medicine

## 2017-06-25 DIAGNOSIS — I2510 Atherosclerotic heart disease of native coronary artery without angina pectoris: Secondary | ICD-10-CM | POA: Insufficient documentation

## 2017-06-25 DIAGNOSIS — R42 Dizziness and giddiness: Secondary | ICD-10-CM | POA: Insufficient documentation

## 2017-06-25 DIAGNOSIS — R509 Fever, unspecified: Secondary | ICD-10-CM | POA: Insufficient documentation

## 2017-06-25 DIAGNOSIS — R51 Headache: Secondary | ICD-10-CM | POA: Insufficient documentation

## 2017-06-25 DIAGNOSIS — R05 Cough: Secondary | ICD-10-CM | POA: Insufficient documentation

## 2017-06-25 DIAGNOSIS — I252 Old myocardial infarction: Secondary | ICD-10-CM | POA: Insufficient documentation

## 2017-06-25 DIAGNOSIS — I10 Essential (primary) hypertension: Secondary | ICD-10-CM | POA: Insufficient documentation

## 2017-06-25 LAB — COMPREHENSIVE METABOLIC PANEL
ALT: 37 U/L (ref 14–54)
AST: 35 U/L (ref 15–41)
Albumin: 4.1 g/dL (ref 3.5–5.0)
Alkaline Phosphatase: 102 U/L (ref 38–126)
Anion gap: 12 (ref 5–15)
BUN: 19 mg/dL (ref 6–20)
CO2: 26 mmol/L (ref 22–32)
Calcium: 9.1 mg/dL (ref 8.9–10.3)
Chloride: 97 mmol/L — ABNORMAL LOW (ref 101–111)
Creatinine, Ser: 0.95 mg/dL (ref 0.44–1.00)
GFR calc Af Amer: >60 mL/min (ref >60)
GFR calc non Af Amer: >60 mL/min (ref >60)
Glucose, Bld: 95 mg/dL (ref 65–99)
Potassium: 3.7 mmol/L (ref 3.5–5.1)
Sodium: 135 mmol/L (ref 135–145)
Total Bilirubin: 1.1 mg/dL (ref 0.3–1.2)
Total Protein: 8 g/dL (ref 6.5–8.1)

## 2017-06-25 LAB — CBC WITH DIFFERENTIAL/PLATELET
BASOS ABS: 0.1 10*3/uL (ref 0.0–0.1)
Basophils Relative: 0 %
EOS PCT: 2 %
Eosinophils Absolute: 0.2 10*3/uL (ref 0.0–0.7)
HCT: 39 % (ref 36.0–46.0)
Hemoglobin: 13.1 g/dL (ref 12.0–15.0)
Lymphocytes Relative: 19 %
Lymphs Abs: 2.9 10*3/uL (ref 0.7–4.0)
MCH: 28 pg (ref 26.0–34.0)
MCHC: 33.6 g/dL (ref 30.0–36.0)
MCV: 83.3 fL (ref 78.0–100.0)
MONO ABS: 1.2 10*3/uL — AB (ref 0.1–1.0)
Monocytes Relative: 8 %
Neutro Abs: 10.8 10*3/uL — ABNORMAL HIGH (ref 1.7–7.7)
Neutrophils Relative %: 71 %
PLATELETS: 303 10*3/uL (ref 150–400)
RBC: 4.68 MIL/uL (ref 3.87–5.11)
RDW: 13.9 % (ref 11.5–15.5)
WBC: 15.2 10*3/uL — ABNORMAL HIGH (ref 4.0–10.5)

## 2017-06-25 NOTE — ED Notes (Signed)
Pt alert & oriented x4, stable gait. Patient given discharge instructions, paperwork & prescription(s). Patient verbalized understanding. Pt left department in wheelchair escorted by staff. Pt left w/ no further questions. 

## 2017-06-25 NOTE — ED Provider Notes (Signed)
Emergency Department Provider Note   I have reviewed the triage vital signs and the nursing notes.   HISTORY  Chief Complaint Fever   HPI Jennifer Diaz is a 47 y.o. female was sent here from her primary care office for "x-ray and repeat labs" secondary to a viral illness.  Patient states that she was seen here 3 days ago with dizziness, headache and nausea.  The symptoms improved with medications however this week and she developed a temperature of 1 102.  This alarmed her so she went to see her primary doctor today who gave her a prescription for Levaquin and Tamiflu even though her flu test was negative and sent her here for further evaluation.  Patient states she is a little bit of a cough today, frontal headache for the last few days dizziness described as vertigo but no other symptoms.  She states that she does not have any neck pain, back pain, nuchal rigidity.  She also has not had any diarrhea in the last couple days she did have some initially.  No constipation.  No rashes.  No abdominal pain.  No urinary symptoms. No other associated or modifying symptoms.    Past Medical History:  Diagnosis Date  . Allergy   . CAD in native artery 07/16/2016  . Coronary artery disease   . Hyperlipidemia   . Hypertension   . Migraine    "4-5/week" (07/14/2016)  . NSTEMI (non-ST elevated myocardial infarction) (HCC) 07/13/2016  . S/P angioplasty with stent 07/14/16 with DES to OM2 07/16/2016  . Sleep apnea   . Thyroid nodule 07/16/2016    Patient Active Problem List   Diagnosis Date Noted  . Hyperglycemia 07/20/2016  . Thyroid nodule 07/16/2016  . CAD in native artery 07/16/2016  . S/P angioplasty with stent 07/14/16 with DES to OM2 07/16/2016  . NSTEMI (non-ST elevated myocardial infarction) (HCC) 07/14/2016  . Hypokalemia 07/14/2016  . Headache 07/14/2016  . Chest pain   . Migraine headache 01/20/2014  . Acute URI 01/20/2014  . Laceration of hand 08/29/2013  . Neuropathy 08/02/2011    . Fatigue 07/05/2011  . Headache(784.0) 07/05/2011  . Family history of Crohn's disease 07/05/2011  . Hip pain, left 07/03/2011  . Hyperlipidemia 03/21/2009  . Obesity 02/08/2009  . SLEEP APNEA, CHRONIC 02/08/2009  . Essential hypertension 03/05/2006  . IBS 03/05/2006    Past Surgical History:  Procedure Laterality Date  . CORONARY ANGIOPLASTY WITH STENT PLACEMENT  07/14/2016  . CORONARY STENT INTERVENTION N/A 07/14/2016   Procedure: Coronary Stent Intervention;  Surgeon: Corky Crafts, MD;  Location: [facility] INVASIVE CV LAB;  Service: Cardiovascular;  Laterality: N/A;  . essure  April 2013   GYN- Dr. Algie Coffer  . LEFT HEART CATH AND CORONARY ANGIOGRAPHY N/A 07/14/2016   Procedure: Left Heart Cath and Coronary Angiography;  Surgeon: Corky Crafts, MD;  Location: [facility] INVASIVE CV LAB;  Service: Cardiovascular;  Laterality: N/A;    Current Outpatient Rx  . Order #: [id number] Class: No Print  . Order #: [id number] Class: No Print  . Order #: [id number] Class: Normal  . Order #: [id number] Class: Normal  . Order #: [id number] Class: Normal  . Order #: [id number] Class: Normal  . Order #: [id number] Class: Normal  . Order #: [id number] Class: Print  . Order #: [id number] Class: Normal  . Order #: [id number] Class: Print  . Order #: [id number] Class: Normal  . Order #: [id number] Class: Normal    Allergies Aleve [naproxen sodium]; Lisinopril; and Norvasc [amlodipine besylate]  Family History  Problem Relation  Age of Onset  . Diabetes Mother   . Heart attack Mother   . Heart disease Mother   . Hypertension Father   . Heart attack Father   . Heart disease Father   . Diabetes Brother     Social History Social History   Tobacco Use  . Smoking status: Never Smoker  . Smokeless tobacco: Never Used  Substance Use Topics  . Alcohol use: Yes    Comment: 07/14/2016 "maybe 2 mixed drinks/month"  . Drug use: No    Review of Systems  All other systems negative except as documented in the HPI.  All pertinent positives and negatives as reviewed in the HPI. ____________________________________________   PHYSICAL EXAM:  VITAL SIGNS: ED Triage Vitals [06/25/17 1843]  Enc Vitals Group     BP 130/84     Pulse Rate 81     Resp 18     Temp 100.2 F (37.9 C)     Temp Source Oral     SpO2 100 %     Weight 187 lb (84.8 kg)     Height 5' (1.524 m)     Head Circumference      Peak Flow      Pain Score 6     Pain Loc      Pain Edu?      Excl. in GC?     Constitutional: Alert and oriented. Well appearing and in no acute distress. Eyes: Conjunctivae are normal. PERRL. EOMI. Head: Atraumatic. Nose: No congestion/rhinnorhea. Mouth/Throat: Mucous membranes are moist.  Oropharynx non-erythematous. Neck: No stridor.  No meningeal signs.   Cardiovascular: Normal rate, regular rhythm. Good peripheral circulation. Grossly normal heart sounds.   Respiratory: Normal respiratory effort.  No retractions. Lungs CTAB. Gastrointestinal: Soft and nontender. No distention.  Musculoskeletal: No lower extremity tenderness nor edema. No gross deformities of extremities. Neurologic:  Normal speech and language. No gross focal neurologic deficits are appreciated.  Skin:  Skin is warm, dry and intact. No rash noted.  ____________________________________________   LABS (all labs ordered are listed, but only abnormal results are displayed)  Labs Reviewed  COMPREHENSIVE METABOLIC PANEL - Abnormal; Notable for the following components:      Result Value   Chloride 97 (*)    All other components within normal limits  CBC WITH DIFFERENTIAL/PLATELET - Abnormal; Notable for the following components:   WBC 15.2 (*)    Neutro Abs 10.8 (*)    Monocytes Absolute 1.2 (*)    All other components within normal limits  URINALYSIS, ROUTINE W REFLEX MICROSCOPIC   ____________________________________________   RADIOLOGY  Dg Chest 2 View  Result Date: 06/25/2017 CLINICAL DATA:  Fever EXAM: CHEST - 2  VIEW COMPARISON:  CT 07/14/2016, radiograph 07/13/2016 FINDINGS: Mildly low lung volumes. Minimal atelectasis at the bases. No focal consolidation. No pleural effusion. Normal cardiomediastinal silhouette. No pneumothorax. IMPRESSION: Low lung volumes with minimal basilar atelectasis Electronically Signed   By: Jasmine PangKim  Fujinaga M.D.   On: 06/25/2017 19:24    ____________________________________________   PROCEDURES  Procedure(s) performed:   Procedures   ____________________________________________   INITIAL IMPRESSION / ASSESSMENT AND PLAN / ED COURSE  Unclear cause of symptoms at this time.  Suspect likely viral illness but she has a prescription for levofloxacin and Tamiflu so she can utilize those per her discretion.  Discussed the possibility of meningitis with there is that she does have a headache and a fever however both of us doubt that it is a bacterial meningitis  and she defers lumbar puncture at this time.  Chest x-ray is clear.  CBC does show elevated white blood cell count but this can be elevated setting of bacteria, fever just distress.  Parainfluenza test at her doctor's office was negative I will not repeat that now.  Patient has not had any urinary symptoms and refuses to do the urinary test.  She is repeatedly says she is ready go home as she thinks she will feel better if she just sleeps it off.  Patient not made to leave AGAINST MEDICAL ADVICE as I feel like it is a reasonable decision to not continue working up what is likely a viral illness.  She knows when to come back for new or worsening symptoms as does her husband.  All questions were answered.     Pertinent labs & imaging results that were available during my care of the patient were reviewed by me and considered in my medical decision making (see chart for details).  ____________________________________________  FINAL CLINICAL IMPRESSION(S) / ED DIAGNOSES  Final diagnoses:  Febrile illness     MEDICATIONS  GIVEN DURING THIS VISIT:  Medications - No data to display   NEW OUTPATIENT MEDICATIONS STARTED DURING THIS VISIT:  New Prescriptions   No medications on file    Note:  This note was prepared with assistance of Dragon voice recognition software. Occasional wrong-word or sound-a-like substitutions may have occurred due to the inherent limitations of voice recognition software.   Marily Memos, MD 06/25/17 2115

## 2017-06-25 NOTE — ED Triage Notes (Signed)
Pt states she was seen on 06/20/17 for a headache and dizziness.  Began running a fever along with the headache and dizziness on Saturday.  Went to pcp today and had a temp of 102 and was sent here for re eval and repeat labs.

## 2017-06-25 NOTE — ED Notes (Signed)
Pt says was sent by PCP for blood work & chest xray. Pt states that's the only reason she is here tonight. EDP in room.

## 2017-06-27 MED FILL — $BRILINTA 90 MG TABLET: 90 | 30 days supply | Qty: 60 | Fill #4 | Status: TO

## 2017-06-27 MED FILL — ATORVASTATIN 80 MG TABLET: 80 | 30 days supply | Qty: 30 | Fill #3

## 2017-06-27 MED FILL — hydrALAZINE HCL 100 MG TABS: 100 | 30 days supply | Qty: 90 | Fill #3

## 2017-06-28 ENCOUNTER — Encounter: Payer: Self-pay | Admitting: Family Medicine

## 2017-06-28 ENCOUNTER — Telehealth: Payer: Self-pay | Admitting: Cardiology

## 2017-06-28 NOTE — Telephone Encounter (Signed)
Spoke with patient and her blood pressure was up to 113/79. She has been to ED twice in the last week. States no fever for several days. Stated she had been drinking plenty of fluids. She continues to have headache, blurred vision in one eye that comes and goes, and fatigue. She is currently taking Hydralazine 100 mg TID, Carvedilol 25 mg BID, Losartan 100 mg in am, and Chlorthalidone 26 mg 1/2 tablet in the am. Heart rate running int the 60's. Her normal SBP is 130's-150's at home per patient. Did advise to contact eye doctor for visual disturbances in the one eye, last eye exam over a year ago.  Discussed with Dr Tresa EndoKelly DOD and will have patient hold evening dose of Hydralazine and starting tomorrow decrease to 50 mg three times a day until she is feeling better and blood pressure goes up. Advised patient, verbalized understanding. Did advise patient go follow up with PCP when she returns in regards to ED visits.

## 2017-06-28 NOTE — Telephone Encounter (Signed)
New Message   Pt c/o BP issue:  1. What are your last 5 BP readings? 96/59 2. Are you having any other symptoms (ex. Dizziness, headache, blurred vision, passed out)? Dizziness, vomiting and diarrhea . She said that she also had a fever for about 3 days.  3. What is your medication issue? none

## 2017-07-04 ENCOUNTER — Inpatient Hospital Stay: Payer: Self-pay | Admitting: Internal Medicine

## 2017-07-30 MED FILL — hydrALAZINE HCL 100 MG TABS: 100 | 30 days supply | Qty: 90 | Fill #4 | Status: TO

## 2017-07-30 MED FILL — CHLORTHALIDONE 25 MG TABLET: 25 | 30 days supply | Qty: 15 | Fill #3 | Status: TO

## 2017-07-30 MED FILL — ?CARVEDILOL 25 MG TABLET: 25 | 30 days supply | Qty: 60 | Fill #2 | Status: TO

## 2017-07-30 MED FILL — $BRILINTA 90 MG TABLET: 90 | 90 days supply | Qty: 180 | Fill #7 | Status: TO

## 2017-07-30 MED FILL — hydrALAZINE HCL 100 MG TABS: 100 | 30 days supply | Qty: 90 | Fill #3 | Status: TO

## 2017-07-30 MED FILL — ATORVASTATIN 80 MG TABLET: 80 | 30 days supply | Qty: 30 | Fill #2 | Status: TO

## 2017-07-30 MED FILL — PANTOPRAZOLE SOD DR 20 MG T: 20 | 30 days supply | Qty: 30 | Fill #3 | Status: TO

## 2017-07-30 MED FILL — ATORVASTATIN 80 MG TABLET: 80 | 30 days supply | Qty: 30 | Fill #4 | Status: TO

## 2017-07-30 MED FILL — CARVEDILOL 25 MG TABS: 25 | 30 days supply | Qty: 60 | Fill #4 | Status: TO

## 2017-07-30 MED FILL — LOSARTAN POTASSIUM 100 MG T: 100 | 30 days supply | Qty: 30 | Fill #4 | Status: TO

## 2017-07-30 MED FILL — LOSARTAN POTASSIUM 100 MG T: 100 | 30 days supply | Qty: 30 | Fill #3 | Status: TO

## 2017-08-08 ENCOUNTER — Encounter

## 2017-08-09 ENCOUNTER — Encounter: Payer: Self-pay | Admitting: Cardiology

## 2017-08-24 ENCOUNTER — Ambulatory Visit: Payer: Self-pay | Admitting: Family Medicine

## 2017-08-26 ENCOUNTER — Other Ambulatory Visit: Payer: Self-pay | Admitting: Adult Health

## 2017-08-27 IMAGING — DX DG ANKLE COMPLETE 3+V*L*
4 series · 4 of 4 positions shown · non-contrast
Comparison: None.

CLINICAL DATA: Pain following fall with twisting injury

EXAM:
LEFT ANKLE COMPLETE - 3+ VIEW

[ankle ap]
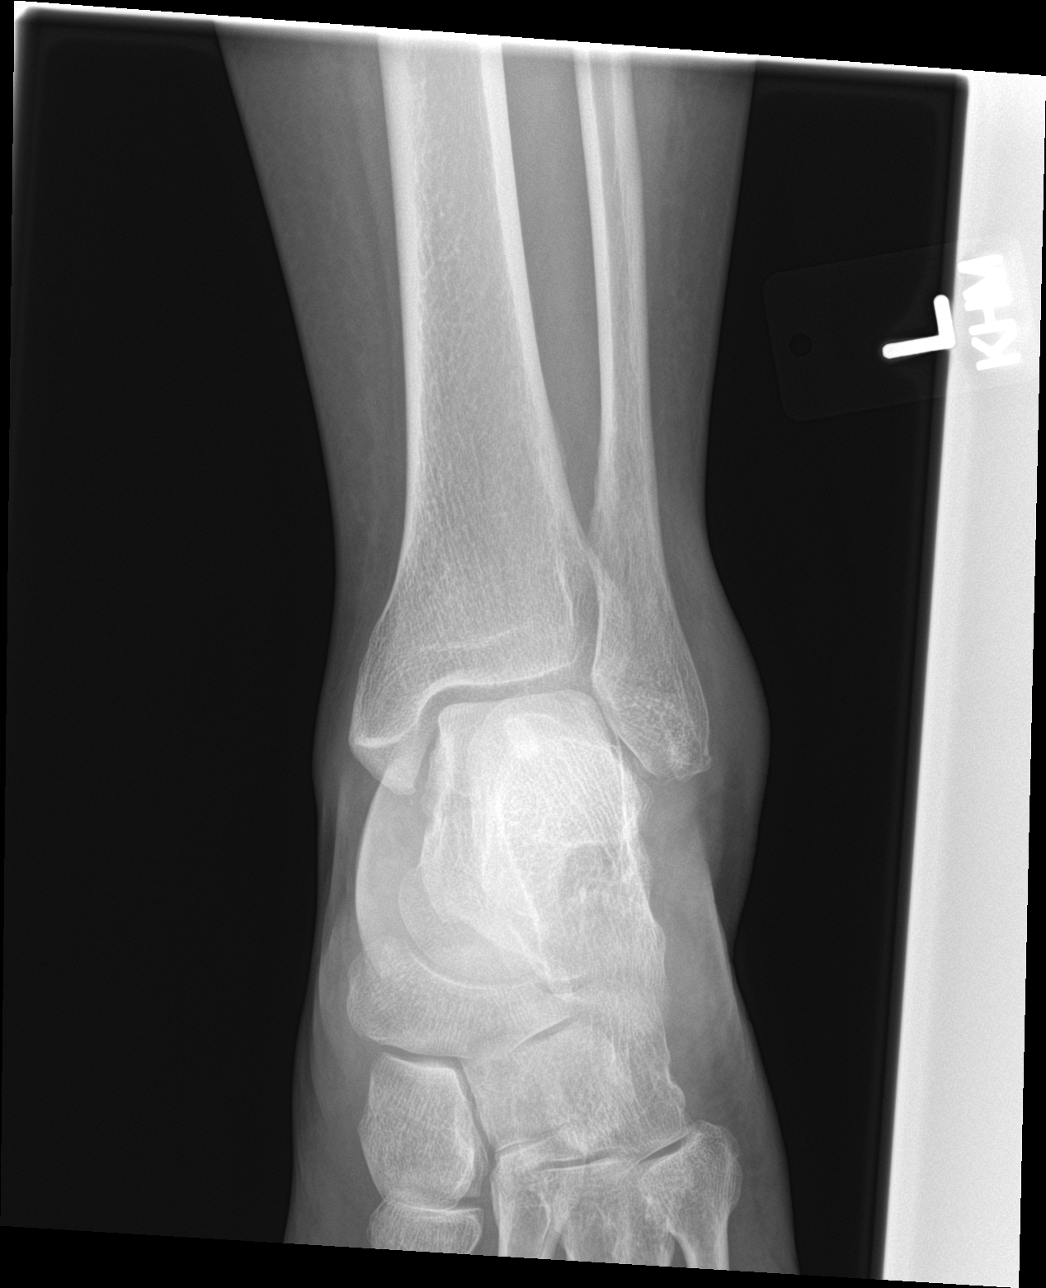

[ankle obl (1 of 2)]
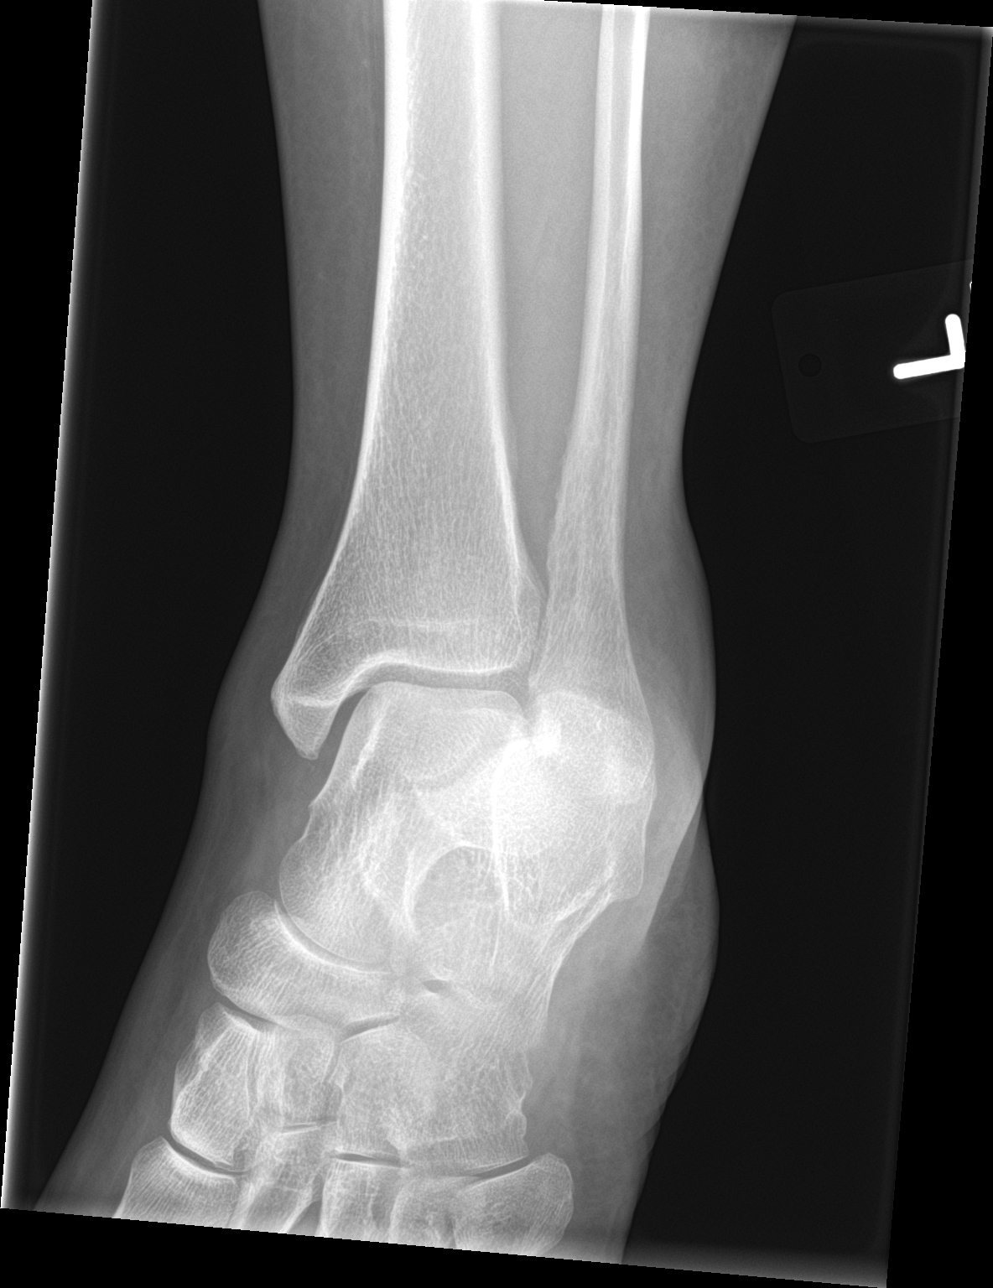

[ankle lat]
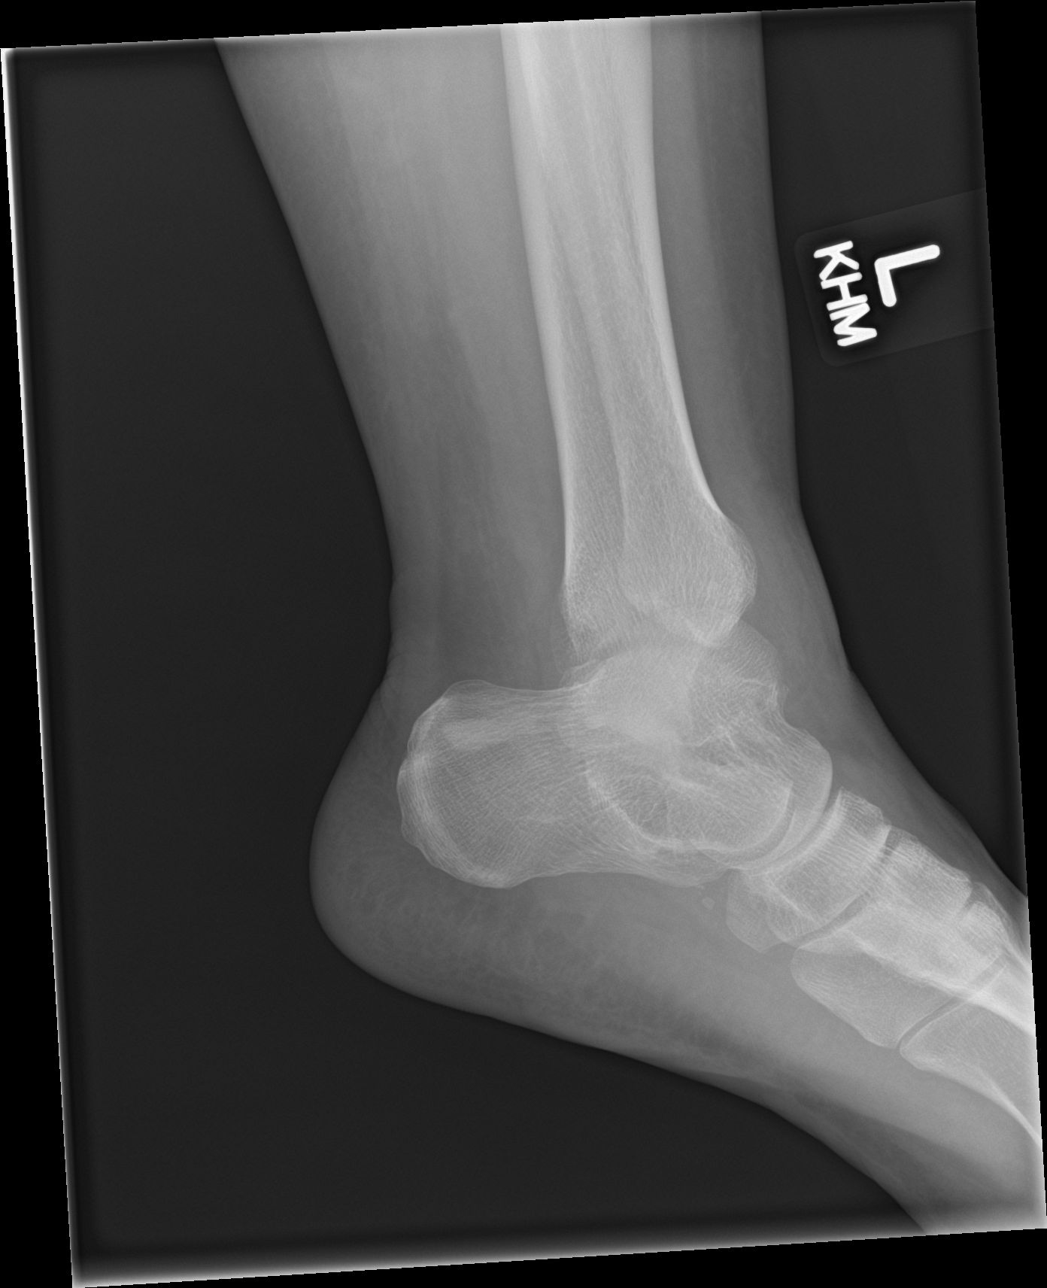

[ankle obl (2 of 2)]
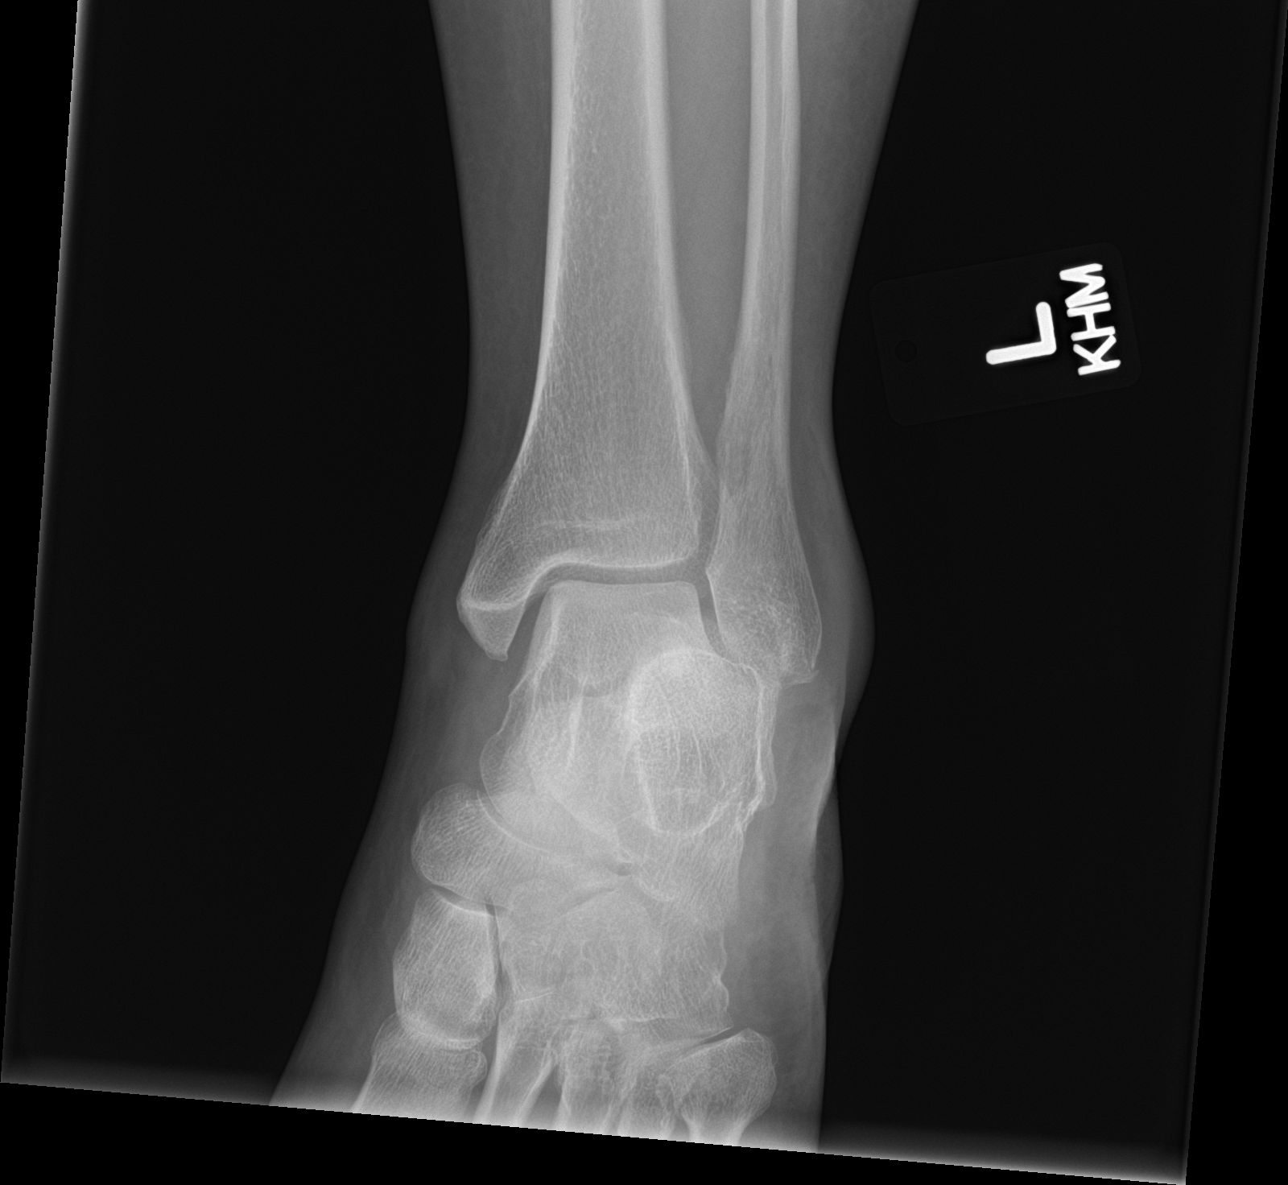

[4 of 4 positions shown; findings below may reference images not displayed]

FINDINGS: Frontal, oblique, and lateral views obtained. There is marked
swelling laterally. There is avulsion in the lateral malleolar
region. No other fractures are apparent. The ankle mortise appears
grossly intact. There is no appreciable joint effusion. There is a
bone island in the superior posterior calcaneus.
IMPRESSION: Avulsion lateral malleolus with marked swelling laterally. Ankle
mortise appears grossly intact.

## 2017-08-27 NOTE — Telephone Encounter (Signed)
Rx has been sent to the pharmacy electronically. ° °

## 2017-09-22 ENCOUNTER — Other Ambulatory Visit: Payer: Self-pay | Admitting: Family Medicine

## 2017-09-22 DIAGNOSIS — I10 Essential (primary) hypertension: Secondary | ICD-10-CM

## 2017-10-02 ENCOUNTER — Other Ambulatory Visit: Payer: Self-pay | Admitting: Family Medicine

## 2017-10-02 DIAGNOSIS — I10 Essential (primary) hypertension: Secondary | ICD-10-CM

## 2017-10-17 ENCOUNTER — Other Ambulatory Visit: Payer: Self-pay | Admitting: Family Medicine

## 2017-10-17 ENCOUNTER — Ambulatory Visit: Payer: Self-pay | Admitting: Family Medicine

## 2017-10-17 DIAGNOSIS — Z95820 Peripheral vascular angioplasty status with implants and grafts: Secondary | ICD-10-CM

## 2017-10-17 NOTE — Telephone Encounter (Signed)
Per Dr. Baxter FlatteryNewlin's note from 05/2017 office visit, pt to discontinue Brilinta 08/2017. This recommendation comes from patient's cardiologist, who last saw her in 05/2017 as well. Refill not appropriate.

## 2018-01-22 ENCOUNTER — Ambulatory Visit (INDEPENDENT_AMBULATORY_CARE_PROVIDER_SITE_OTHER): Payer: Self-pay

## 2018-01-22 ENCOUNTER — Ambulatory Visit (HOSPITAL_COMMUNITY)
Admission: EM | Admit: 2018-01-22 | Discharge: 2018-01-22 | Disposition: A | Discharge status: Home / Self Care | Payer: Self-pay | Attending: Family Medicine | Admitting: Family Medicine

## 2018-01-22 ENCOUNTER — Encounter (HOSPITAL_COMMUNITY): Payer: Self-pay | Admitting: Emergency Medicine

## 2018-01-22 DIAGNOSIS — S93492A Sprain of other ligament of left ankle, initial encounter: Secondary | ICD-10-CM

## 2018-01-22 DIAGNOSIS — M25572 Pain in left ankle and joints of left foot: Secondary | ICD-10-CM

## 2018-01-22 MED ORDER — IBUPROFEN 800 MG PO TABS: 800.0000 mg | ORAL_TABLET | Freq: Three times a day (TID) | ORAL | 0 refills | Status: DC

## 2018-01-22 MED ORDER — HYDROCODONE-ACETAMINOPHEN 7.5-325 MG PO TABS: 1.0000 | ORAL_TABLET | Freq: Four times a day (QID) | ORAL | 0 refills | Status: DC | PRN

## 2018-01-22 NOTE — ED Provider Notes (Signed)
[facility]    CSN: [id number] Arrival date & time: 01/22/18  1745     History   Chief Complaint Chief Complaint  Patient presents with  . Ankle Pain    HPI Jennifer Diaz is a 47 y.o. female.   HPI  Patient twisted her ankle just prior to arrival.  She states she was walking on uneven ground.  She states that her left ankle turned inward, she felt a pop and pain.  She has tenderness over her lateral ankle.  She states she is unable to bear weight because of the pain.  No prior problems with this ankle.  No prior fractures. She states she is otherwise well.  Compliant with her medication.  Blood pressure usually well controlled.  Elevated today because of stress and pain.  She has taken her medications as prescribed.  Past Medical History:  Diagnosis Date  . Allergy   . CAD in native artery 07/16/2016  . Coronary artery disease   . Hyperlipidemia   . Hypertension   . Migraine    "4-5/week" (07/14/2016)  . NSTEMI (non-ST elevated myocardial infarction) (HCC) 07/13/2016  . S/P angioplasty with stent 07/14/16 with DES to OM2 07/16/2016  . Sleep apnea   . Thyroid nodule 07/16/2016    Patient Active Problem List   Diagnosis Date Noted  . Hyperglycemia 07/20/2016  . Thyroid nodule 07/16/2016  . CAD in native artery 07/16/2016  . S/P angioplasty with stent 07/14/16 with DES to OM2 07/16/2016  . NSTEMI (non-ST elevated myocardial infarction) (HCC) 07/14/2016  . Hypokalemia 07/14/2016  . Headache 07/14/2016  . Chest pain   . Migraine headache 01/20/2014  . Acute URI 01/20/2014  . Laceration of hand 08/29/2013  . Neuropathy 08/02/2011  . Fatigue 07/05/2011  . Headache(784.0) 07/05/2011  . Family history of Crohn's disease 07/05/2011  . Hip pain, left 07/03/2011  . Hyperlipidemia 03/21/2009  . Obesity 02/08/2009  . SLEEP APNEA, CHRONIC 02/08/2009  . Essential hypertension 03/05/2006  . IBS 03/05/2006    Past Surgical History:  Procedure Laterality Date  .  CORONARY ANGIOPLASTY WITH STENT PLACEMENT  07/14/2016  . CORONARY STENT INTERVENTION N/A 07/14/2016   Procedure: Coronary Stent Intervention;  Surgeon: Corky Crafts, MD;  Location: [facility] INVASIVE CV LAB;  Service: Cardiovascular;  Laterality: N/A;  . essure  April 2013   GYN- Dr. Algie Coffer  . LEFT HEART CATH AND CORONARY ANGIOGRAPHY N/A 07/14/2016   Procedure: Left Heart Cath and Coronary Angiography;  Surgeon: Corky Crafts, MD;  Location: [facility] INVASIVE CV LAB;  Service: Cardiovascular;  Laterality: N/A;    OB History   None      Home Medications    Prior to Admission medications   Medication Sig Start Date End Date Taking? Authorizing Provider  aspirin 81 MG chewable tablet Chew 1 tablet (81 mg total) by mouth daily. 07/17/16   Leone Brand, NP  atorvastatin (LIPITOR) 80 MG tablet TAKE 1 TABLET BY MOUTH DAILY AT 6 PM. 02/27/17   Hoy Register, MD  carvedilol (COREG) 25 MG tablet TAKE 1 TABLET BY MOUTH 2 TIMES DAILY WITH A MEAL. 02/27/17   Hoy Register, MD  chlorthalidone (HYGROTON) 25 MG tablet TAKE HALF A TABLET BY MOUTH EVERY DAY 08/27/17   Lewayne Bunting, MD  hydrALAZINE (APRESOLINE) 100 MG tablet Take 1 tablet (100 mg total) 3 (three) times daily by mouth. 02/27/17   Hoy Register, MD  HYDROcodone-acetaminophen (NORCO) 7.5-325 MG tablet Take 1 tablet by mouth every 6 (  six) hours as needed for moderate pain. 01/22/18   Eustace Moore, MD  ibuprofen (ADVIL,MOTRIN) 800 MG tablet Take 1 tablet (800 mg total) by mouth 3 (three) times daily. 01/22/18   Eustace Moore, MD  losartan (COZAAR) 100 MG tablet TAKE 1 TABLET BY MOUTH EVERY DAY 09/24/17   Hoy Register, MD  meclizine (ANTIVERT) 25 MG tablet Take one pill every 6-8 hours for  dizziness 06/20/17   Bethann Berkshire, MD  nitroGLYCERIN (NITROSTAT) 0.4 MG SL tablet Place 1 tablet (0.4 mg total) under the tongue every 5 (five) minutes x 3 doses as needed for chest pain. 07/16/16   Leone Brand, NP  ondansetron  (ZOFRAN ODT) 4 MG disintegrating tablet 4mg  ODT q4 hours prn nausea/vomit 06/20/17   Bethann Berkshire, MD  pantoprazole (PROTONIX) 20 MG tablet TAKE 1 TABLET BY MOUTH EVERY DAY 08/27/17   Lewayne Bunting, MD    Family History Family History  Problem Relation Age of Onset  . Diabetes Mother   . Heart attack Mother   . Heart disease Mother   . Hypertension Father   . Heart attack Father   . Heart disease Father   . Diabetes Brother     Social History Social History   Tobacco Use  . Smoking status: Never Smoker  . Smokeless tobacco: Never Used  Substance Use Topics  . Alcohol use: Yes    Comment: 07/14/2016 "maybe 2 mixed drinks/month"  . Drug use: No     Allergies   Aleve [naproxen sodium]; Lisinopril; and Norvasc [amlodipine besylate]   Review of Systems Review of Systems  Constitutional: Negative for chills and fever.  HENT: Negative for ear pain and sore throat.   Eyes: Negative for pain and visual disturbance.  Respiratory: Negative for cough and shortness of breath.   Cardiovascular: Negative for chest pain and palpitations.  Gastrointestinal: Negative for abdominal pain and vomiting.  Genitourinary: Negative for dysuria and hematuria.  Musculoskeletal: Positive for arthralgias and gait problem. Negative for back pain.  Skin: Negative for color change and rash.  Neurological: Negative for seizures and syncope.  All other systems reviewed and are negative.    Physical Exam Triage Vital Signs ED Triage Vitals  Enc Vitals Group     BP 01/22/18 1805 (!) 182/98     Pulse Rate 01/22/18 1805 82     Resp 01/22/18 1805 16     Temp 01/22/18 1806 98.1 F (36.7 C)     Temp Source 01/22/18 1806 Oral     SpO2 01/22/18 1805 100 %     Weight --      Height --      Head Circumference --      Peak Flow --      Pain Score 01/22/18 1805 10     Pain Loc --      Pain Edu? --      Excl. in GC? --    No data found.  Updated Vital Signs BP (!) 182/98   Pulse 82   Temp  98.1 F (36.7 C) (Oral)   Resp 16   SpO2 100%   Visual Acuity Right Eye Distance:   Left Eye Distance:   Bilateral Distance:    Right Eye Near:   Left Eye Near:    Bilateral Near:     Physical Exam  Constitutional: She appears well-developed and well-nourished. She appears distressed.  Appears uncomfortable.  In wheelchair.  HENT:  Head: Normocephalic and atraumatic.  Mouth/Throat: Oropharynx  is clear and moist.  Eyes: Pupils are equal, round, and reactive to light. Conjunctivae are normal.  Neck: Normal range of motion.  Cardiovascular: Normal rate.  Pulmonary/Chest: Effort normal. No respiratory distress.  Abdominal: Soft. She exhibits no distension.  Musculoskeletal: Normal range of motion. She exhibits no edema.  Left ankle is examined.  Limited range of motion secondary to pain.  Swelling laterally.  Tenderness over lateral malleolus.  No instability.  No tenderness of the heel, medial ankle, or metatarsals.  Pulses and cap refill are intact.  Normal sensation.  Neurological: She is alert.  Skin: Skin is warm and dry.     UC Treatments / Results  Labs (all labs ordered are listed, but only abnormal results are displayed) Labs Reviewed - No data to display  EKG None  Radiology Dg Ankle Complete Left  Result Date: 01/22/2018 CLINICAL DATA:  47 year old female post fall. Pain. Initial encounter. EXAM: LEFT ANKLE COMPLETE - 3+ VIEW COMPARISON:  02/06/2015. FINDINGS: No acute fracture or dislocation. Soft tissue swelling lateral malleolus consistent with soft tissue injury. Ankle mortise is intact. Nonspecific sclerotic focus posterior calcaneus unchanged and possibly a bone island. IMPRESSION: 1. No acute fracture or dislocation. 2. Soft tissue swelling lateral malleolar region consistent with soft tissue injury. Electronically Signed   By: Lacy Duverney M.D.   On: 01/22/2018 18:44    Procedures Procedures (including critical care time)  Medications Ordered in  UC Medications - No data to display  Initial Impression / Assessment and Plan / UC Course  I have reviewed the triage vital signs and the nursing notes.  Pertinent labs & imaging results that were available during my care of the patient were reviewed by me and considered in my medical decision making (see chart for details).     Discussed sprain ankle.  Ice, elevation, immobilization. Final Clinical Impressions(s) / UC Diagnoses   Final diagnoses:  Sprain of anterior talofibular ligament of left ankle, initial encounter     Discharge Instructions     Elevate to reduce swelling Ice pack for 20 minutes every 2-4 hours Walk as tolerated Take ibuprofen 3 times a day with food for mild pain Take hydrocodone as needed for severe pain Hydrocodone can cause dizziness, drowsiness, constipation.  Do not drive Follow-up with your primary care doctor in a week or 2   ED Prescriptions    Medication Sig Dispense Auth. Provider   ibuprofen (ADVIL,MOTRIN) 800 MG tablet Take 1 tablet (800 mg total) by mouth 3 (three) times daily. 21 tablet Eustace Moore, MD   HYDROcodone-acetaminophen [facility]) 7.5-325 MG tablet Take 1 tablet by mouth every 6 (six) hours as needed for moderate pain. 15 tablet Eustace Moore, MD     Controlled Substance Prescriptions [place] Controlled Substance Registry consulted? Not Applicable   Eustace Moore, MD 01/22/18 2016

## 2018-01-22 NOTE — ED Triage Notes (Signed)
PT rolled left ankle earlier today. Cannot bear weight.

## 2018-01-22 NOTE — Discharge Instructions (Signed)
Elevate to reduce swelling Ice pack for 20 minutes every 2-4 hours Walk as tolerated Take ibuprofen 3 times a day with food for mild pain Take hydrocodone as needed for severe pain Hydrocodone can cause dizziness, drowsiness, constipation.  Do not drive Follow-up with your primary care doctor in a week or 2

## 2018-01-25 ENCOUNTER — Other Ambulatory Visit: Payer: Self-pay | Admitting: Family Medicine

## 2018-01-25 DIAGNOSIS — I10 Essential (primary) hypertension: Secondary | ICD-10-CM

## 2018-02-23 ENCOUNTER — Other Ambulatory Visit: Payer: Self-pay | Admitting: Family Medicine

## 2018-02-23 DIAGNOSIS — I10 Essential (primary) hypertension: Secondary | ICD-10-CM

## 2018-03-08 ENCOUNTER — Other Ambulatory Visit: Payer: Self-pay | Admitting: Family Medicine

## 2018-03-08 ENCOUNTER — Other Ambulatory Visit: Payer: Self-pay

## 2018-03-08 DIAGNOSIS — I10 Essential (primary) hypertension: Secondary | ICD-10-CM

## 2018-03-11 ENCOUNTER — Other Ambulatory Visit: Payer: Self-pay | Admitting: Family Medicine

## 2018-03-11 ENCOUNTER — Telehealth: Payer: Self-pay | Admitting: *Deleted

## 2018-03-11 DIAGNOSIS — I10 Essential (primary) hypertension: Secondary | ICD-10-CM

## 2018-03-11 NOTE — Telephone Encounter (Signed)
    2  [facility], 47 y.o., 1970-07-30 MRN:  [id number] Phone:  [phone] (H) PCP:  Jennifer Diaz, [place], Jennifer Diaz Coverage:  None Next Appt With Podiatry 03/20/2018 at 1:30 PM Medication Renewal Request  Received: 3 days ago  Message Contents  Naraine, Natalyia  P Cv Div Ch St Triage  Phone Number: [phone]        ----- Message from Mychart, Generic sent at 03/08/2018 7:37 PM EST -----   [name] would like a refill of the following medications:    aspirin 81 MG chewable tablet Jennifer Diaz[Laura Ingold, NP]   Preferred pharmacy: [id number] - [place], [place] - [address] AT [address]    Medication renewals requested in this message routed separately:    losartan (COZAAR) 100 MG tablet [Jennifer Diaz Newlin, Jennifer Diaz]    carvedilol (COREG) 25 MG tablet [Jennifer Diaz Newlin, Jennifer Diaz]   Requested Medications    aspirin 81 MG chewable tablet       Sig: Chew 1 tablet (81 mg total) by mouth daily.   Disp:  Not specified  Refills:     Start: 03/08/2018   Class: No Print   Non-formulary   Last ordered: 1 year ago by Leone BrandLaura R Ingold, NP      To be filled at: [id number] - [place], [place] - [address] AT [address]      Medication Refill   You 2 hours ago (8:28 AM)      LM TO CALL BACK ./CY      Documentation     Martino, Sharra  Patient Medication Renewal Request Pool 3 days ago      Jennifer Diaz would like a refill of the following medications:    aspirin 81 MG chewable tablet Jennifer Diaz[Laura Ingold, NP]   Preferred pharmacy: [id number] - , [place] - [address] AT [address]    Medication renewals requested in this message routed separately:    losartan (COZAAR) 100 MG tablet [Jennifer Diaz Newlin, Jennifer Diaz]    carvedilol (COREG) 25 MG tablet Jennifer Register[Jennifer Diaz Newlin, Jennifer Diaz]

## 2018-03-11 NOTE — Telephone Encounter (Signed)
LM TO CALL BACK ./CY 

## 2018-03-15 MED ORDER — LOSARTAN POTASSIUM 100 MG PO TABS: 100.0000 mg | ORAL_TABLET | Freq: Every day | ORAL | 0 refills | Status: DC

## 2018-03-15 MED ORDER — CARVEDILOL 25 MG PO TABS: ORAL_TABLET | ORAL | 0 refills | Status: DC

## 2018-03-15 NOTE — Telephone Encounter (Signed)
Lm to call back ./cy 

## 2018-03-18 NOTE — Telephone Encounter (Signed)
Unable to reach pt have left a couple of messages for pt to call back re Aspirin may get this OTC dos not need a script ./cy

## 2018-03-20 ENCOUNTER — Ambulatory Visit (INDEPENDENT_AMBULATORY_CARE_PROVIDER_SITE_OTHER): Payer: Self-pay | Admitting: Podiatry

## 2018-03-20 ENCOUNTER — Encounter: Payer: Self-pay | Admitting: Podiatry

## 2018-03-20 ENCOUNTER — Ambulatory Visit (INDEPENDENT_AMBULATORY_CARE_PROVIDER_SITE_OTHER): Payer: Self-pay

## 2018-03-20 VITALS — BP 163/93 | HR 65

## 2018-03-20 DIAGNOSIS — M2041 Other hammer toe(s) (acquired), right foot: Secondary | ICD-10-CM

## 2018-03-20 DIAGNOSIS — M21611 Bunion of right foot: Secondary | ICD-10-CM

## 2018-03-20 NOTE — Patient Instructions (Signed)
Pre-Operative Instructions  Congratulations, you have decided to take an important step towards improving your quality of life.  You can be assured that the doctors and staff at [facility] will be with you every step of the way.  Here are some important things you should know:  1. Plan to be at the surgery center/hospital at least 1 (one) hour prior to your scheduled time, unless otherwise directed by the surgical center/hospital staff.  You must have a responsible adult accompany you, remain during the surgery and drive you home.  Make sure you have directions to the surgical center/hospital to ensure you arrive on time. 2. If you are having surgery at [facility] or [facility], you will need a copy of your medical history and physical form from your family physician within one month prior to the date of surgery. We will give you a form for your primary physician to complete.  3. We make every effort to accommodate the date you request for surgery.  However, there are times where surgery dates or times have to be moved.  We will contact you as soon as possible if a change in schedule is required.   4. No aspirin/ibuprofen for one week before surgery.  If you are on aspirin, any non-steroidal anti-inflammatory medications (Mobic, Aleve, Ibuprofen) should not be taken seven (7) days prior to your surgery.  You make take Tylenol for pain prior to surgery.  5. Medications - If you are taking daily heart and blood pressure medications, seizure, reflux, allergy, asthma, anxiety, pain or diabetes medications, make sure you notify the surgery center/hospital before the day of surgery so they can tell you which medications you should take or avoid the day of surgery. 6. No food or drink after midnight the night before surgery unless directed otherwise by surgical center/hospital staff. 7. No alcoholic beverages 24-hours prior to surgery.  No smoking 24-hours prior or 24-hours after  surgery. 8. Wear loose pants or shorts. They should be loose enough to fit over bandages, boots, and casts. 9. Don't wear slip-on shoes. Sneakers are preferred. 10. Bring your boot with you to the surgery center/hospital.  Also bring crutches or a walker if your physician has prescribed it for you.  If you do not have this equipment, it will be provided for you after surgery. 11. If you have not been contacted by the surgery center/hospital by the day before your surgery, call to confirm the date and time of your surgery. 12. Leave-time from work may vary depending on the type of surgery you have.  Appropriate arrangements should be made prior to surgery with your employer. 13. Prescriptions will be provided immediately following surgery by your doctor.  Fill these as soon as possible after surgery and take the medication as directed. Pain medications will not be refilled on weekends and must be approved by the doctor. 14. Remove nail polish on the operative foot and avoid getting pedicures prior to surgery. 15. Wash the night before surgery.  The night before surgery wash the foot and leg well with water and the antibacterial soap provided. Be sure to pay special attention to beneath the toenails and in between the toes.  Wash for at least three (3) minutes. Rinse thoroughly with water and dry well with a towel.  Perform this wash unless told not to do so by your physician.  Enclosed: 1 Ice pack (please put in freezer the night before surgery)   1 Hibiclens skin cleaner     Pre-op instructions  If you have any questions regarding the instructions, please do not hesitate to call our office.  [place]: [address], [place], [place] [address] -- [phone]  [place]: [address] [place], [place] [address] -- [phone]  [place]: [address]  [place], [place] [address] -- [phone]  [place]: [address], [address], [place], [place] [address] -- [phone]  Website:  [email or web address] 

## 2018-03-24 NOTE — Progress Notes (Signed)
   Subjective: 47 year old female presenting today as a new patient, referred by Dr. Elijah Birkom, with a chief complaint of constant, nagging pain of the right great toe and dorsum of the foot that began a few years ago. She reports associated intermittent sharp pain. She has not had any recent treatment. Walking and wearing certain shoes increases her pain. Patient is here for further evaluation and treatment.    Past Medical History:  Diagnosis Date  . Allergy   . CAD in native artery 07/16/2016  . Coronary artery disease   . Hyperlipidemia   . Hypertension   . Migraine    "4-5/week" (07/14/2016)  . NSTEMI (non-ST elevated myocardial infarction) (HCC) 07/13/2016  . S/P angioplasty with stent 07/14/16 with DES to OM2 07/16/2016  . Sleep apnea   . Thyroid nodule 07/16/2016      Objective: Physical Exam General: The patient is alert and oriented x3 in no acute distress.  Dermatology: Skin is cool, dry and supple bilateral lower extremities. Negative for open lesions or macerations.  Vascular: Palpable pedal pulses bilaterally. No edema or erythema noted. Capillary refill within normal limits.  Neurological: Epicritic and protective threshold grossly intact bilaterally.   Musculoskeletal Exam: Clinical evidence of bunion deformity noted to the respective foot. There is moderate pain on palpation range of motion of the first MPJ. Lateral deviation of the hallux noted consistent with hallux abductovalgus.  Radiographic Exam: Increased intermetatarsal angle greater than 15 with a hallux abductus angle greater than 30 noted on AP view. Moderate degenerative changes noted within the first MPJ.  Assessment: 1. HAV w/ bunion deformity right     Plan of Care:  1. Patient was evaluated. X-Rays reviewed. 2. Today we discussed the conservative versus surgical management of the presenting pathology. The patient opts for surgical management. All possible complications and details of the procedure were  explained. All patient questions were answered. No guarantees were expressed or implied. 3. Authorization for surgery was initiated today. Surgery will consist of bunionectomy with metatarsal osteotomy right.  4. Return to clinic one week post op.     Felecia ShellingBrent M. Evans, DPM [facility]  Dr. Felecia ShellingBrent M. Evans, DPM    [address]                                        [place], [place] [address]                Office [phone]  Fax [phone]

## 2018-04-10 ENCOUNTER — Other Ambulatory Visit: Payer: Self-pay | Admitting: Family Medicine

## 2018-04-10 DIAGNOSIS — I10 Essential (primary) hypertension: Secondary | ICD-10-CM

## 2018-04-11 ENCOUNTER — Telehealth: Payer: Self-pay | Admitting: *Deleted

## 2018-04-11 NOTE — Telephone Encounter (Addendum)
"  I'm due to have surgery new month.  I'm calling to give you my new insurance ID and see if it can be verified and what I have to pay out of pocket.  Please call me on my cell."

## 2018-04-15 ENCOUNTER — Other Ambulatory Visit: Payer: Self-pay | Admitting: Family Medicine

## 2018-04-15 DIAGNOSIS — I10 Essential (primary) hypertension: Secondary | ICD-10-CM

## 2018-04-16 NOTE — Telephone Encounter (Signed)
"  I'm scheduled for surgery on February 6.  My insurance has changed and I'd like to know how much I will have to pay out of pocket."  What insurance do you have now?  "I have [employer]."  What is the ID and group number?  "The number is [id number] and the group number is [id number]."  I will ask Chip BoerVicki in our insurance department to give you a call and give you an estimate.  "Okay, that's fine.  Thank you."  Eliberto Ivory([facility])

## 2018-04-18 ENCOUNTER — Telehealth: Payer: Self-pay | Admitting: Cardiology

## 2018-04-18 ENCOUNTER — Ambulatory Visit: Payer: BLUE CROSS/BLUE SHIELD | Admitting: Physician Assistant

## 2018-04-18 ENCOUNTER — Encounter: Payer: Self-pay | Admitting: Physician Assistant

## 2018-04-18 VITALS — BP 132/90 | HR 77 | Ht 61.0 in | Wt 190.0 lb

## 2018-04-18 DIAGNOSIS — I10 Essential (primary) hypertension: Secondary | ICD-10-CM | POA: Diagnosis not present

## 2018-04-18 DIAGNOSIS — I2510 Atherosclerotic heart disease of native coronary artery without angina pectoris: Secondary | ICD-10-CM | POA: Diagnosis not present

## 2018-04-18 DIAGNOSIS — E7800 Pure hypercholesterolemia, unspecified: Secondary | ICD-10-CM

## 2018-04-18 DIAGNOSIS — R079 Chest pain, unspecified: Secondary | ICD-10-CM

## 2018-04-18 MED ORDER — NITROGLYCERIN 0.4 MG SL SUBL: 0.4000 mg | SUBLINGUAL_TABLET | SUBLINGUAL | 4 refills | Status: DC | PRN

## 2018-04-18 MED ORDER — LOSARTAN POTASSIUM 100 MG PO TABS: 100.0000 mg | ORAL_TABLET | Freq: Every day | ORAL | 11 refills | Status: DC

## 2018-04-18 MED ORDER — CARVEDILOL 25 MG PO TABS: ORAL_TABLET | ORAL | 11 refills | Status: DC

## 2018-04-18 NOTE — Patient Instructions (Signed)
Medication Instructions:  Continue current medications If you need a refill on your cardiac medications before your next appointment, please call your pharmacy.   Lab work: FASTING lab work to check cholesterol & CMET (can be done same day as stress test) If you have labs (blood work) drawn today and your tests are completely normal, you will receive your results only by: Marland Kitchen MyChart Message (if you have MyChart) OR . A paper copy in the mail If you have any lab test that is abnormal or we need to change your treatment, we will call you to review the results.  Testing/Procedures: Your physician has requested that you have an exercise tolerance test.     Follow-Up: At [facility], you and your health needs are our priority.  As part of our continuing mission to provide you with exceptional heart care, we have created designated Provider Care Teams.  These Care Teams include your primary Cardiologist (physician) and Advanced Practice Providers (APPs -  Physician Assistants and Nurse Practitioners) who all work together to provide you with the care you need, when you need it. You will need a follow up appointment in 6-8 months.  Please call our office 2 months in advance to schedule this appointment.  You may see Dr. Jens Som or one of the following Advanced Practice Providers on your designated Care Team:   Corine Shelter, PA-C Judy Pimple, [place] . Marjie Skiff, PA-C  Any Other Special Instructions Will Be Listed Below (If Applicable).

## 2018-04-18 NOTE — Progress Notes (Signed)
Cardiology Office Note    Date:  04/20/2018   ID:  Jennifer Diaz, DOB April 17, 1970, MRN [id number]  PCP:  Hoy RegisterNewlin, Enobong, MD  Cardiologist: Dr. Jens Somrenshaw  Chief Complaint  Patient presents with  . Follow-up    chest pain going on for a few months    History of Present Illness:  Jennifer Diaz is a 48 y.o. female with PMH of HTN, HLD, OSA and CAD.  Patient was admitted in April 2018 and ruled in for NSTEMI.  CT angiogram of the chest was negative for PE, however did pick up a thyroid nodule.  Thyroid ultrasound showed benign mixed cystic and solid nodule in the right mid gland, no further work-up was recommended.  Cardiac catheterization revealed occluded second marginal but no other obstructive disease.  She had PCI of of her obtuse marginal branch with drug-eluting stent.  Echocardiogram obtained in April 2018 showed normal LV function, mild MR.  Myoview in January 2019 showed EF 66% with normal perfusion.  She was seen by Dr. Jens Somrenshaw in February 2019 at which time she continued to have occasional chest discomfort.  She was last seen by Bailey MechKatherine Lawrence, NP on 05/29/2017, her chest pain was felt to be more stress related.  Patient presents today for evaluation of chest pain.  For the past several months, patient has been having increasing episodes of chest discomfort lasting from 30 seconds up to 45 seconds.  This is not associated with exertion and can occur at any time.  This is the same chest discomfort she had prior to the stress test last year.  She says the chest discomfort never completely went away however just more noticeable and more frequent recently.  Since the increase in frequency of the chest pain several months ago, there has been no further increase in frequency since.  The duration of the chest discomfort is also unchanged.  She was able to climb up and down the stairs without any exacerbation of the symptom.  I think this chest discomfort is quite atypical, I recommended  plain old treadmill test to further assess.    Past Medical History:  Diagnosis Date  . Allergy   . CAD in native artery 07/16/2016  . Coronary artery disease   . Hyperlipidemia   . Hypertension   . Migraine    "4-5/week" (07/14/2016)  . NSTEMI (non-ST elevated myocardial infarction) (HCC) 07/13/2016  . S/P angioplasty with stent 07/14/16 with DES to OM2 07/16/2016  . Sleep apnea   . Thyroid nodule 07/16/2016    Past Surgical History:  Procedure Laterality Date  . CORONARY ANGIOPLASTY WITH STENT PLACEMENT  07/14/2016  . CORONARY STENT INTERVENTION N/A 07/14/2016   Procedure: Coronary Stent Intervention;  Surgeon: Corky CraftsJayadeep S Varanasi, MD;  [facility];  Service: Cardiovascular;  Laterality: N/A;  . essure  April 2013   GYN- Dr. Algie CofferFogelman  . LEFT HEART CATH AND CORONARY ANGIOGRAPHY N/A 07/14/2016   Procedure: Left Heart Cath and Coronary Angiography;  Surgeon: Corky CraftsJayadeep S Varanasi, MD;  [facility];  Service: Cardiovascular;  Laterality: N/A;    Current Medications: Outpatient Medications Prior to Visit  Medication Sig Dispense Refill  . aspirin 81 MG chewable tablet Chew 1 tablet (81 mg total) by mouth daily.    Marland Kitchen. atorvastatin (LIPITOR) 80 MG tablet TAKE 1 TABLET BY MOUTH DAILY AT 6 PM. 30 tablet 6  . chlorthalidone (HYGROTON) 25 MG tablet TAKE HALF A TABLET BY MOUTH EVERY DAY 15 tablet 9  .  hydrALAZINE (APRESOLINE) 100 MG tablet Take 1 tablet (100 mg total) 3 (three) times daily by mouth. 90 tablet 6  . pantoprazole (PROTONIX) 20 MG tablet TAKE 1 TABLET BY MOUTH EVERY DAY 30 tablet 9  . carvedilol (COREG) 25 MG tablet TAKE 1 TABLET BY MOUTH TWICE A DAY WITH A MEAL. MUST MAKE APPT FOR FURTHER REFILLS 60 tablet 0  . losartan (COZAAR) 100 MG tablet Take 1 tablet (100 mg total) by mouth daily. 30 tablet 0  . nitroGLYCERIN (NITROSTAT) 0.4 MG SL tablet Place 1 tablet (0.4 mg total) under the tongue every 5 (five) minutes x 3 doses as needed for chest pain. 25 tablet  4  . HYDROcodone-acetaminophen (NORCO) 7.5-325 MG tablet Take 1 tablet by mouth every 6 (six) hours as needed for moderate pain. (Patient not taking: Reported on 04/18/2018) 15 tablet 0  . ibuprofen (ADVIL,MOTRIN) 800 MG tablet Take 1 tablet (800 mg total) by mouth 3 (three) times daily. (Patient not taking: Reported on 04/18/2018) 21 tablet 0  . meclizine (ANTIVERT) 25 MG tablet Take one pill every 6-8 hours for  dizziness (Patient not taking: Reported on 04/18/2018) 20 tablet 0  . ondansetron (ZOFRAN ODT) 4 MG disintegrating tablet 4mg  ODT q4 hours prn nausea/vomit (Patient not taking: Reported on 04/18/2018) 12 tablet 0   No facility-administered medications prior to visit.      Allergies:   Aleve [naproxen sodium]; Lisinopril; and Norvasc [amlodipine besylate]   Social History   Socioeconomic History  . Marital status: Married    Spouse name: Not on file  . Number of children: Not on file  . Years of education: Not on file  . Highest education level: Not on file  Occupational History  . Not on file  Social Needs  . Financial resource strain: Not on file  . Food insecurity:    Worry: Not on file    Inability: Not on file  . Transportation needs:    Medical: Not on file    Non-medical: Not on file  Tobacco Use  . Smoking status: Never Smoker  . Smokeless tobacco: Never Used  Substance and Sexual Activity  . Alcohol use: Yes    Comment: 07/14/2016 "maybe 2 mixed drinks/month"  . Drug use: No  . Sexual activity: Yes    Birth control/protection: I.U.D.  Lifestyle  . Physical activity:    Days per week: Not on file    Minutes per session: Not on file  . Stress: Not on file  Relationships  . Social connections:    Talks on phone: Not on file    Gets together: Not on file    Attends religious service: Not on file    Active member of club or organization: Not on file    Attends meetings of clubs or organizations: Not on file    Relationship status: Not on file  Other Topics  Concern  . Not on file  Social History Narrative  . Not on file     Family History:  The patient's family history includes Diabetes in her brother and mother; Heart attack in her father and mother; Heart disease in her father and mother; Hypertension in her father.   ROS:   Please see the history of present illness.    ROS All other systems reviewed and are negative.   PHYSICAL EXAM:   VS:  BP 132/90   Pulse 77   Ht 5\' 1"  (1.549 m)   Wt 190 lb (86.2 kg)   BMI  35.90 kg/m    GEN: Well nourished, well developed, in no acute distress  HEENT: normal  Neck: no JVD, carotid bruits, or masses Cardiac: RRR; no murmurs, rubs, or gallops,no edema  Respiratory:  clear to auscultation bilaterally, normal work of breathing GI: soft, nontender, nondistended, + BS MS: no deformity or atrophy  Skin: warm and dry, no rash Neuro:  Alert and Oriented x 3, Strength and sensation are intact Psych: euthymic mood, full affect  Wt Readings from Last 3 Encounters:  04/18/18 190 lb (86.2 kg)  06/25/17 187 lb (84.8 kg)  06/20/17 187 lb (84.8 kg)      Studies/Labs Reviewed:   EKG:  EKG is ordered today.  The ekg ordered today demonstrates NSR without significant ST-T wave changes  Recent Labs: 05/29/2017: TSH 3.240 06/25/2017: ALT 37; BUN 19; Creatinine, Ser 0.95; Hemoglobin 13.1; Platelets 303; Potassium 3.7; Sodium 135   Lipid Panel    Component Value Date/Time   CHOL 142 02/27/2017 0951   TRIG 76 02/27/2017 0951   HDL 66 02/27/2017 0951   CHOLHDL 2.2 02/27/2017 0951   CHOLHDL 4.4 07/14/2016 0157   VLDL 49 (H) 07/14/2016 0157   LDLCALC 61 02/27/2017 0951    Additional studies/ records that were reviewed today include:   Cath 07/14/2016  Mid RCA lesion, 10 %stenosed.  Dist Cx lesion, 10 %stenosed.  Mid LAD lesion, 10 %stenosed.  2nd Mrg lesion, 100 %stenosed. THis was the culprit for her NSTEMI.  A STENT PROMUS PREM MR 2.25X12 drug eluting stent was successfully placed,  postdilated to 2.5 mm.  Post intervention, there is a 0% residual stenosis.  The left ventricular systolic function is normal.  LV end diastolic pressure is mildly elevated.  The left ventricular ejection fraction is 55-65% by visual estimate.  There is no aortic valve stenosis.  LVEDP 22 mm Hg.   Continue aggressive secondary prevention.  Plan for DAPT for 12 months without interruption.  Chest pain that she had upon arrival to cath lab has resolved.    Myoview 04/20/2017 Study Highlights     The left ventricular ejection fraction is hyperdynamic (>65%).  Nuclear stress EF: 66%.  There was no ST segment deviation noted during stress.  The study is normal.  This is a low risk study.   Low risk stress nuclear study with normal perfusion and normal left ventricular regional and global systolic function.       ASSESSMENT:    1. Chest pain, unspecified type   2. Hypertension, unspecified type   3. Hypercholesterolemia   4. Coronary artery disease involving native coronary artery of native heart without angina pectoris      PLAN:  In order of problems listed above:  1. 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  I recommended a plain old treadmill in this case since her symptom is not associated with exertion.  This is similar to her symptoms prior to last years stress test, however her symptom is more frequent recently.  2. CAD: She had a stent placed in April 2018 in her OM 2.  Continue aspirin and statin  3. Hypertension: Blood pressure well controlled  4. Hyperlipidemia: Continue Lipitor 80 mg daily.  She is due for fasting lipid panel, will also obtain complete metabolic panel to make sure her liver function is stable and electrolyte is stable on current blood pressure medication including losartan    Medication  Adjustments/Labs and Tests Ordered: Current medicines are  reviewed at length with the patient today.  Concerns regarding medicines are outlined above.  Medication changes, Labs and Tests ordered today are listed in the Patient Instructions below. Patient Instructions  Medication Instructions:  Continue current medications If you need a refill on your cardiac medications before your next appointment, please call your pharmacy.   Lab work: FASTING lab work to check cholesterol & CMET (can be done same day as stress test) If you have labs (blood work) drawn today and your tests are completely normal, you will receive your results only by: Marland Kitchen. MyChart Message (if you have MyChart) OR . A paper copy in the mail If you have any lab test that is abnormal or we need to change your treatment, we will call you to review the results.  Testing/Procedures: Your physician has requested that you have an exercise tolerance test.     Follow-Up: At [facility], you and your health needs are our priority.  As part of our continuing mission to provide you with exceptional heart care, we have created designated Provider Care Teams.  These Care Teams include your primary Cardiologist (physician) and Advanced Practice Providers (APPs -  Physician Assistants and Nurse Practitioners) who all work together to provide you with the care you need, when you need it. You will need a follow up appointment in 6-8 months.  Please call our office 2 months in advance to schedule this appointment.  You may see Dr. Jens Somrenshaw or one of the following Advanced Practice Providers on your designated Care Team:   Corine ShelterLuke Kilroy, PA-C Judy PimpleKrista Kroeger, [place]-C . Marjie Skiffallie Goodrich, PA-C  Any Other Special Instructions Will Be Listed Below (If Applicable).       Ramond DialSigned, Cadel Stairs, [place]  04/20/2018 11:47 PM    [facility] [address], [place], [place]  [address] Phone: [phone](336) [phone]; Fax: [phone]

## 2018-04-18 NOTE — Telephone Encounter (Signed)
Patient has been scheduled to see PA today at 1:30pm

## 2018-04-18 NOTE — Telephone Encounter (Signed)
Returned call to patient. She reports chest pain that feels like it did prior to her last stress test (which was normal 1 year ago). She states she pain is not severe enough to go to ED. She is almost due for 1 year visit. Offered patient 1:30pm appointment today (with [facility] PA) and she will check on childcare and let us know if she can make this appointment.

## 2018-04-18 NOTE — Telephone Encounter (Signed)
From: Jennifer Diaz  Sent: 04/18/2018  7:28 AM EST  To: Cv Div Nl Scheduling  Subject: Appointment Request                 Appointment Request From: Jennifer Diaz    With Provider: Olga Millers, MD [facility]]    Preferred Date Range: 04/23/2018 - 04/26/2018    Preferred Times: Any time    Reason for visit: Request an Appointment    Comments:  Chest pain-not severe enough for me to go to ER

## 2018-04-20 ENCOUNTER — Encounter: Payer: Self-pay | Admitting: Physician Assistant

## 2018-04-24 ENCOUNTER — Telehealth (HOSPITAL_COMMUNITY): Payer: Self-pay

## 2018-04-24 NOTE — Telephone Encounter (Signed)
Encounter complete. 

## 2018-04-26 ENCOUNTER — Other Ambulatory Visit: Payer: Self-pay | Admitting: Family Medicine

## 2018-04-26 ENCOUNTER — Ambulatory Visit (HOSPITAL_COMMUNITY)
Admission: RE | Admit: 2018-04-26 | Discharge: 2018-04-26 | Disposition: A | Discharge status: Home / Self Care | Payer: BLUE CROSS/BLUE SHIELD | Source: Ambulatory Visit | Attending: Internal Medicine | Admitting: Internal Medicine

## 2018-04-26 DIAGNOSIS — E7800 Pure hypercholesterolemia, unspecified: Secondary | ICD-10-CM | POA: Diagnosis not present

## 2018-04-26 DIAGNOSIS — Z95820 Peripheral vascular angioplasty status with implants and grafts: Secondary | ICD-10-CM

## 2018-04-26 DIAGNOSIS — I2510 Atherosclerotic heart disease of native coronary artery without angina pectoris: Secondary | ICD-10-CM

## 2018-04-26 DIAGNOSIS — R079 Chest pain, unspecified: Secondary | ICD-10-CM | POA: Insufficient documentation

## 2018-04-26 LAB — EXERCISE TOLERANCE TEST
Estimated workload: 10.1 METS
Exercise duration (min): 8 min
Exercise duration (sec): 2 s
MPHR: 171 {beats}/min
Peak HR: 169 {beats}/min
Percent HR: 98 %
RPE: 17
Rest HR: 75 {beats}/min

## 2018-04-27 LAB — COMPREHENSIVE METABOLIC PANEL
ALT: 54 IU/L — AB (ref 0–32)
AST: 38 IU/L (ref 0–40)
Albumin/Globulin Ratio: 1.5 (ref 1.2–2.2)
Albumin: 4.4 g/dL (ref 3.5–5.5)
Alkaline Phosphatase: 122 IU/L — ABNORMAL HIGH (ref 39–117)
BUN/Creatinine Ratio: 12 (ref 9–23)
BUN: 10 mg/dL (ref 6–24)
Bilirubin Total: 0.5 mg/dL (ref 0.0–1.2)
CO2: 20 mmol/L (ref 20–29)
Calcium: 10 mg/dL (ref 8.7–10.2)
Chloride: 95 mmol/L — ABNORMAL LOW (ref 96–106)
Creatinine, Ser: 0.86 mg/dL (ref 0.57–1.00)
GFR calc Af Amer: 92 mL/min/{1.73_m2} (ref >59)
GFR calc non Af Amer: 80 mL/min/{1.73_m2} (ref >59)
Globulin, Total: 3 g/dL (ref 1.5–4.5)
Glucose: 108 mg/dL — ABNORMAL HIGH (ref 65–99)
Potassium: 4.1 mmol/L (ref 3.5–5.2)
Sodium: 137 mmol/L (ref 134–144)
Total Protein: 7.4 g/dL (ref 6.0–8.5)

## 2018-04-27 LAB — LIPID PANEL
Chol/HDL Ratio: 3.9 ratio (ref 0.0–4.4)
Cholesterol, Total: 213 mg/dL — ABNORMAL HIGH (ref 100–199)
HDL: 55 mg/dL (ref >39)
LDL Calculated: 125 mg/dL — ABNORMAL HIGH (ref 0–99)
Triglycerides: 166 mg/dL — ABNORMAL HIGH (ref 0–149)
VLDL Cholesterol Cal: 33 mg/dL (ref 5–40)

## 2018-05-01 ENCOUNTER — Other Ambulatory Visit: Payer: Self-pay

## 2018-05-01 ENCOUNTER — Other Ambulatory Visit: Payer: Self-pay | Admitting: *Deleted

## 2018-05-01 DIAGNOSIS — Z95820 Peripheral vascular angioplasty status with implants and grafts: Secondary | ICD-10-CM

## 2018-05-01 MED ORDER — ATORVASTATIN CALCIUM 80 MG PO TABS: ORAL_TABLET | ORAL | 6 refills | Status: DC

## 2018-05-01 NOTE — Progress Notes (Signed)
RX lipitor sent to pt requested pharmacy

## 2018-05-06 ENCOUNTER — Encounter: Payer: Self-pay | Admitting: Podiatry

## 2018-05-13 ENCOUNTER — Telehealth: Payer: Self-pay | Admitting: Podiatry

## 2018-05-13 ENCOUNTER — Telehealth: Payer: Self-pay | Admitting: *Deleted

## 2018-05-13 NOTE — Telephone Encounter (Signed)
I have a few questions regarding the surgery that's coming up please.

## 2018-05-13 NOTE — Telephone Encounter (Signed)
"  I'm scheduled to have surgery on Thursday.  I have to fly out of the country to [place] in a couple of weeks.  Will it be okay for me to have the surgery or do you think I will need to reschedule it?"  If you are going to be flying, you risk the chance of your foot swelling then it can become painful.  "I'll be flying business class and I'll be able to keep it elevate.  Will that help?"  It will not necessarily help.  Flying causes a gravitational pull.  "Will I be able to walk?"  You'll be wearing a boot for a couple of weeks.  Then you will transition into a surgical shoe.  After wearing that shoe, you'll transition into a sneaker or a soft closed toe shoe."  "How long will my surgery take?  I need to know so I can let the anesthesiologist know.  What time will I need to be there?"  Your procedure will take one hour.  Someone will call you a day or two prior to your surgery date and they will give you your arrival time.  "How much time will you need for me to let you know if I want to reschedule my surgery?"  I need to know by tomorrow because the surgical center administrator needs to let the other patients know their arrival times.  "Okay, I will let you know as soon as I decide on tomorrow."

## 2018-05-14 NOTE — Telephone Encounter (Signed)
Postop visits have been rescheduled. °

## 2018-05-14 NOTE — Telephone Encounter (Addendum)
"  I need to cancel my surgery for tomorrow and reschedule it."  Would you like to reschedule it now.  "Yes, that is fine."  Do you know when you want to do it?  "I'd like to do it in April or the end of March."  Dr. Logan Bores can do it March 26,2020 or April 2 or 9, 2020.  "Let's do it for April 2."  I will get it rescheduled.  I rescheduled the surgery from May 16, 2018 to July 11, 2018 via [employer].

## 2018-05-22 ENCOUNTER — Other Ambulatory Visit: Payer: BLUE CROSS/BLUE SHIELD

## 2018-05-22 DIAGNOSIS — N926 Irregular menstruation, unspecified: Secondary | ICD-10-CM | POA: Diagnosis not present

## 2018-05-22 DIAGNOSIS — Z23 Encounter for immunization: Secondary | ICD-10-CM | POA: Diagnosis not present

## 2018-05-22 DIAGNOSIS — Z01419 Encounter for gynecological examination (general) (routine) without abnormal findings: Secondary | ICD-10-CM | POA: Diagnosis not present

## 2018-05-22 DIAGNOSIS — Z1231 Encounter for screening mammogram for malignant neoplasm of breast: Secondary | ICD-10-CM | POA: Diagnosis not present

## 2018-05-22 DIAGNOSIS — Z6836 Body mass index (BMI) 36.0-36.9, adult: Secondary | ICD-10-CM | POA: Diagnosis not present

## 2018-05-24 ENCOUNTER — Other Ambulatory Visit: Payer: Self-pay | Admitting: Family Medicine

## 2018-05-24 DIAGNOSIS — I2510 Atherosclerotic heart disease of native coronary artery without angina pectoris: Secondary | ICD-10-CM

## 2018-05-27 ENCOUNTER — Encounter: Payer: Self-pay | Admitting: Family Medicine

## 2018-05-27 ENCOUNTER — Other Ambulatory Visit: Payer: Self-pay | Admitting: Family Medicine

## 2018-05-27 ENCOUNTER — Other Ambulatory Visit: Payer: Self-pay | Admitting: Pharmacist

## 2018-05-27 ENCOUNTER — Telehealth: Payer: Self-pay | Admitting: Pharmacist

## 2018-05-27 DIAGNOSIS — I2510 Atherosclerotic heart disease of native coronary artery without angina pectoris: Secondary | ICD-10-CM

## 2018-05-27 NOTE — Telephone Encounter (Signed)
Received request from pharmacy that patient is leaving the country for 40 days. She was last seen in clinic in Feb, 2019. Will forward request to patient's PCP.

## 2018-05-27 NOTE — Telephone Encounter (Signed)
Patient is going out of the country and needs medication for 40 days.

## 2018-06-14 ENCOUNTER — Encounter: Payer: Self-pay | Admitting: Podiatry

## 2018-06-18 ENCOUNTER — Other Ambulatory Visit: Payer: Self-pay | Admitting: Cardiology

## 2018-06-25 ENCOUNTER — Encounter: Payer: Self-pay | Admitting: Podiatry

## 2018-07-01 ENCOUNTER — Encounter: Payer: Self-pay | Admitting: Podiatry

## 2018-07-17 ENCOUNTER — Encounter: Payer: BLUE CROSS/BLUE SHIELD | Admitting: Podiatry

## 2018-08-19 ENCOUNTER — Other Ambulatory Visit: Payer: Self-pay | Admitting: Family Medicine

## 2018-08-19 DIAGNOSIS — I2510 Atherosclerotic heart disease of native coronary artery without angina pectoris: Secondary | ICD-10-CM

## 2018-09-21 ENCOUNTER — Other Ambulatory Visit: Payer: Self-pay | Admitting: Family Medicine

## 2018-09-21 DIAGNOSIS — I2510 Atherosclerotic heart disease of native coronary artery without angina pectoris: Secondary | ICD-10-CM

## 2018-10-07 ENCOUNTER — Encounter: Payer: Self-pay | Admitting: Podiatry

## 2018-10-09 DIAGNOSIS — Z03818 Encounter for observation for suspected exposure to other biological agents ruled out: Secondary | ICD-10-CM | POA: Diagnosis not present

## 2018-10-14 NOTE — Progress Notes (Addendum)
Cardiology Office Note   Date:  10/17/2018   ID:  Jennifer Diaz, DOB 1970/09/27, MRN [id number]  PCP:  Hoy RegisterNewlin, Enobong, MD  Cardiologist:  Winfield Rastr.Crenshaw  Atypical chest pain   History of Present Illness: Jennifer Diaz is a 48 y.o. female who presents for ongoing assessment and management of hypertension, hyperlipidemia, and CAD.  The patient was admitted in April 2018 for an STEMI.  CT angiogram of the chest was negative for PE, but was positive for thyroid nodule.  Follow-up ultrasound of the thyroid revealed benign mixed cystic and solid nodule in the right mid gland.  Cardiac catheterization completed during that hospitalization revealed occluded second marginal but no other obstructive disease.  She subsequently had PCI of her obtuse marginal branch with drug-eluting stent.  Echocardiogram completed in April 2018 revealed normal LV function with mild MR.  A follow-up Myoview in January 2019 revealed an EF of 66% with normal perfusion and no evidence of ischemia.  She was last seen by Azalee CourseHao Meng, PA on 04/18/2018 with complaints of recurrent chest pain.  She states that the pain is been increasing for the last several months prior to being seen last approximately 30seconds to 45 seconds not associated with shortness of breath or fatigue.  She states it was the same type of chest pain that occurred prior to her stress test last year that was negative for ischemia.  In the past she was diagnosed with anxiety and stress causing her discomfort in her chest.  She was also diagnosed with atypical chest pain, with her symptoms presenting that day as similar to the pain she had described earlier..  She was planned for an exercise treadmill test without myocardial perfusion.  This was completed on 04/26/2018.  This revealed negative for ischemia, there were no ST or T wave changes to suggest ischemia.  She was continued on her current medication regimen without changes.  She presents to the office today and  states that her chest discomfort is still sporadic.  It lasts for 1 to 2 minutes appearing without any pattern and unassociated with exertion.  The chest discomfort is described as dull aching that is abrupt and onset and regression.  She has not taken any nitroglycerin.  She states that she was  not allowed to travel back from [place] until 3 weeks ago.  Her and her husband had traveled to [place] for a wedding but were not allowed to leave due to the coronavirus.  She was able to get travel visa in the lottery and come back 3 weeks ago.  Since she has returned home she has started to walk about 2 miles per day and has noticed increased fatigue.  She states that when she was in [place] for 4 months she was not physically active at all and stayed inside.  She feels she has lost some of her fitness due to her inactivity while she was in [place].  She denies shortness of breath, lower extremity edema, presyncope, syncope, weakness, melena, hematuria, hemoptysis, orthopnea and PND.   Past Medical History:  Diagnosis Date  . Allergy   . CAD in native artery 07/16/2016  . Coronary artery disease   . Hyperlipidemia   . Hypertension   . Migraine    "4-5/week" (07/14/2016)  . NSTEMI (non-ST elevated myocardial infarction) ([facility]) 07/13/2016  . S/P angioplasty with stent 07/14/16 with DES to OM2 07/16/2016  . Sleep apnea   . Thyroid nodule 07/16/2016    Past Surgical History:  Procedure Laterality Date  .  CORONARY ANGIOPLASTY WITH STENT PLACEMENT  07/14/2016  . CORONARY STENT INTERVENTION N/A 07/14/2016   Procedure: Coronary Stent Intervention;  Surgeon: Corky CraftsJayadeep S Varanasi, MD;  [facility];  Service: Cardiovascular;  Laterality: N/A;  . essure  April 2013   GYN- Dr. Algie CofferFogelman  . LEFT HEART CATH AND CORONARY ANGIOGRAPHY N/A 07/14/2016   Procedure: Left Heart Cath and Coronary Angiography;  Surgeon: Corky CraftsJayadeep S Varanasi, MD;  [facility];  Service: Cardiovascular;  Laterality: N/A;      Current Outpatient Medications  Medication Sig Dispense Refill  . aspirin 81 MG chewable tablet Chew 1 tablet (81 mg total) by mouth daily.    Marland Kitchen. atorvastatin (LIPITOR) 80 MG tablet TAKE 1 TABLET BY MOUTH DAILY AT 6 PM. 30 tablet 6  . carvedilol (COREG) 25 MG tablet TAKE 1 TABLET BY MOUTH TWICE A DAY WITH A MEAL. 60 tablet 11  . chlorthalidone (HYGROTON) 25 MG tablet TAKE HALF A TABLET BY MOUTH EVERY DAY 15 tablet 6  . hydrALAZINE (APRESOLINE) 100 MG tablet TAKE 1 TABLET BY MOUTH 3 TIMES A DAY 270 tablet 0  . losartan (COZAAR) 100 MG tablet Take 1 tablet (100 mg total) by mouth daily. 30 tablet 11  . nitroGLYCERIN (NITROSTAT) 0.4 MG SL tablet Place 1 tablet (0.4 mg total) under the tongue every 5 (five) minutes x 3 doses as needed for chest pain. 25 tablet 4  . pantoprazole (PROTONIX) 20 MG tablet TAKE 1 TABLET BY MOUTH EVERY DAY 30 tablet 6   No current facility-administered medications for this visit.     Allergies:   Aleve [naproxen sodium], Lisinopril, and Norvasc [amlodipine besylate]    Social History:  The patient  reports that she has never smoked. She has never used smokeless tobacco. She reports current alcohol use. She reports that she does not use drugs.   Family History:  The patient's family history includes Diabetes in her brother and mother; Heart attack in her father and mother; Heart disease in her father and mother; Hypertension in her father.    ROS: All other systems are reviewed and negative. Unless otherwise mentioned in H&P    PHYSICAL EXAM: VS:  BP 126/84   Pulse 83   Ht 5\' 1"  (1.549 m)   Wt 189 lb 6.4 oz (85.9 kg)   SpO2 96%   BMI 35.79 kg/m  , BMI Body mass index is 35.79 kg/m. GEN: Well nourished, well developed, in no acute distress HEENT: normal Neck: no JVD, carotid bruits, or masses Cardiac: RRR; no murmurs, rubs, or gallops,no edema  Respiratory:  Clear to auscultation bilaterally, normal work of breathing GI: soft, nontender, nondistended, + BS  MS: no deformity or atrophy Skin: warm and dry, no rash Neuro:  Strength and sensation are intact Psych: euthymic mood, full affect   EKG:  EKG is not ordered today.    Recent Labs: 04/26/2018: ALT 54 10/16/2018: BUN 7; Creatinine, Ser 0.71; Hemoglobin 11.6; Platelets 313; Potassium 4.2; Sodium 141; TSH 3.060    Lipid Panel    Component Value Date/Time   CHOL 213 (H) 04/26/2018 1007   TRIG 166 (H) 04/26/2018 1007   HDL 55 04/26/2018 1007   CHOLHDL 3.9 04/26/2018 1007   CHOLHDL 4.4 07/14/2016 0157   VLDL 49 (H) 07/14/2016 0157   LDLCALC 125 (H) 04/26/2018 1007      Wt Readings from Last 3 Encounters:  10/16/18 189 lb 6.4 oz (85.9 kg)  04/18/18 190 lb (86.2 kg)  06/25/17 187 lb (84.8 kg)      Other studies Reviewed: GXT-04/26/2018 Study Highlights   There was no ST segment deviation noted during stress.  Pt walked for 8:02 of a Bruce protocol GXT. She achieved a peak HR of 169 which i s98% predicted maximal HR .  At peak exercise, there were no ST or T wave changes to suggest ischemia .  BP response was normal .  Negative GXT .      ASSESSMENT AND PLAN:  1.  Fatigue- Able to walk 2 miles per day without chest pain.  She states that while she was in [place] for the last 4 months she was very sedentary. - Deconditioning in the presence of sedentary lifestyle change Increase physical activity as tolerated-do not increase more than 10% exercise duration a week. Ordered CBC, BMP, TSH  2.  Chest pain- chronic GXT stress test, low risk, no ST deviation Continue nitroglycerin 0.4 mg sublingual tablet as needed  3.  Hypercholesterolemia- LDL 04/26/2018: Cholesterol, Total 213; HDL 55; LDL Calculated 125; Triglycerides 166 Continue atorvastatin 80 mg tablet daily Increase physical activity as tolerated Heart healthy low-sodium diet  4.  Essential hypertension- well-controlled Continue carvedilol 25 mg tablet twice daily Continue hydralazine 100 mg tablet 2 times  daily Continue losartan 100 mg tablet daily  Disposition: Follow-up with Dr. Stanford Breed in 6 months.   Current medicines are reviewed at length with the patient today.    Labs/ tests ordered today include: CBC, BMP, TSH  Jossie Ng. Khyla Mccumbers NP-C    10/17/2018 8:33 AM    [address] [place] [address] Office [phone] Fax [phone]

## 2018-10-16 ENCOUNTER — Encounter: Payer: Self-pay | Admitting: Podiatry

## 2018-10-16 ENCOUNTER — Ambulatory Visit: Payer: BC Managed Care – PPO | Admitting: Adult Health

## 2018-10-16 ENCOUNTER — Encounter: Payer: Self-pay | Admitting: Adult Health

## 2018-10-16 ENCOUNTER — Other Ambulatory Visit: Payer: Self-pay

## 2018-10-16 ENCOUNTER — Ambulatory Visit: Payer: BC Managed Care – PPO | Admitting: Podiatry

## 2018-10-16 VITALS — BP 126/84 | HR 83 | Ht 61.0 in | Wt 189.4 lb

## 2018-10-16 DIAGNOSIS — I10 Essential (primary) hypertension: Secondary | ICD-10-CM

## 2018-10-16 DIAGNOSIS — M21611 Bunion of right foot: Secondary | ICD-10-CM

## 2018-10-16 DIAGNOSIS — R079 Chest pain, unspecified: Secondary | ICD-10-CM | POA: Diagnosis not present

## 2018-10-16 DIAGNOSIS — E7800 Pure hypercholesterolemia, unspecified: Secondary | ICD-10-CM

## 2018-10-16 DIAGNOSIS — R5383 Other fatigue: Secondary | ICD-10-CM

## 2018-10-16 NOTE — Patient Instructions (Signed)
Pre-Operative Instructions  Congratulations, you have decided to take an important step towards improving your quality of life.  You can be assured that the doctors and staff at [facility] will be with you every step of the way.  Here are some important things you should know:  1. Plan to be at the surgery center/hospital at least 1 (one) hour prior to your scheduled time, unless otherwise directed by the surgical center/hospital staff.  You must have a responsible adult accompany you, remain during the surgery and drive you home.  Make sure you have directions to the surgical center/hospital to ensure you arrive on time. 2. If you are having surgery at [facility] or [facility], you will need a copy of your medical history and physical form from your family physician within one month prior to the date of surgery. We will give you a form for your primary physician to complete.  3. We make every effort to accommodate the date you request for surgery.  However, there are times where surgery dates or times have to be moved.  We will contact you as soon as possible if a change in schedule is required.   4. No aspirin/ibuprofen for one week before surgery.  If you are on aspirin, any non-steroidal anti-inflammatory medications (Mobic, Aleve, Ibuprofen) should not be taken seven (7) days prior to your surgery.  You make take Tylenol for pain prior to surgery.  5. Medications - If you are taking daily heart and blood pressure medications, seizure, reflux, allergy, asthma, anxiety, pain or diabetes medications, make sure you notify the surgery center/hospital before the day of surgery so they can tell you which medications you should take or avoid the day of surgery. 6. No food or drink after midnight the night before surgery unless directed otherwise by surgical center/hospital staff. 7. No alcoholic beverages 24-hours prior to surgery.  No smoking 24-hours prior or 24-hours after  surgery. 8. Wear loose pants or shorts. They should be loose enough to fit over bandages, boots, and casts. 9. Don't wear slip-on shoes. Sneakers are preferred. 10. Bring your boot with you to the surgery center/hospital.  Also bring crutches or a walker if your physician has prescribed it for you.  If you do not have this equipment, it will be provided for you after surgery. 11. If you have not been contacted by the surgery center/hospital by the day before your surgery, call to confirm the date and time of your surgery. 12. Leave-time from work may vary depending on the type of surgery you have.  Appropriate arrangements should be made prior to surgery with your employer. 13. Prescriptions will be provided immediately following surgery by your doctor.  Fill these as soon as possible after surgery and take the medication as directed. Pain medications will not be refilled on weekends and must be approved by the doctor. 14. Remove nail polish on the operative foot and avoid getting pedicures prior to surgery. 15. Wash the night before surgery.  The night before surgery wash the foot and leg well with water and the antibacterial soap provided. Be sure to pay special attention to beneath the toenails and in between the toes.  Wash for at least three (3) minutes. Rinse thoroughly with water and dry well with a towel.  Perform this wash unless told not to do so by your physician.  Enclosed: 1 Ice pack (please put in freezer the night before surgery)   1 Hibiclens skin cleaner     Pre-op instructions  If you have any questions regarding the instructions, please do not hesitate to call our office.  [place]: [address], [place], [place] [address] -- [phone]  [place]: [address] [place], [place] [address] -- [phone]  [place]: [address]  [place], [place] [address] -- [phone]  [place]: [address], [address], [place], [place] [address] -- [phone]  Website:  [email or web address] 

## 2018-10-16 NOTE — Patient Instructions (Signed)
Medication Instructions:  Continue current medications  If you need a refill on your cardiac medications before your next appointment, please call your pharmacy.  Labwork: TSH, CBC, BMP HERE IN OUR OFFICE AT [facility]  You will need to fast. DO NOT EAT OR DRINK PAST MIDNIGHT.     You will NOT need to fast   Take the provided lab slips with you to the lab for your blood draw.   When you have your labs (blood work) drawn today and your tests are completely normal, you will receive your results only by MyChart Message (if you have MyChart) -OR-  A paper copy in the mail.  If you have any lab test that is abnormal or we need to change your treatment, we will call you to review these results.  Testing/Procedures: None Ordered  Follow-Up: You will need a follow up appointment in 6 months.  Please call our office 2 months in advance to schedule this appointment.  You may see Dr Stanford Breed or one of the following Advanced Practice Providers on your designated Care Team:   Kerin Ransom, PA-C [place], [place] . Sande Rives, PA-C     At [facility], you and your health needs are our priority.  As part of our continuing mission to provide you with exceptional heart care, we have created designated Provider Care Teams.  These Care Teams include your primary Cardiologist (physician) and Advanced Practice Providers (APPs -  Physician Assistants and Nurse Practitioners) who all work together to provide you with the care you need, when you need it.  Thank you for choosing [facility] at [facility]!!

## 2018-10-17 ENCOUNTER — Telehealth: Payer: Self-pay | Admitting: *Deleted

## 2018-10-17 DIAGNOSIS — E7800 Pure hypercholesterolemia, unspecified: Secondary | ICD-10-CM

## 2018-10-17 LAB — BASIC METABOLIC PANEL
BUN/Creatinine Ratio: 10 (ref 9–23)
BUN: 7 mg/dL (ref 6–24)
CO2: 23 mmol/L (ref 20–29)
Calcium: 9.4 mg/dL (ref 8.7–10.2)
Chloride: 104 mmol/L (ref 96–106)
Creatinine, Ser: 0.71 mg/dL (ref 0.57–1.00)
GFR calc Af Amer: 116 mL/min/{1.73_m2} (ref >59)
GFR calc non Af Amer: 100 mL/min/{1.73_m2} (ref >59)
Glucose: 89 mg/dL (ref 65–99)
Potassium: 4.2 mmol/L (ref 3.5–5.2)
Sodium: 141 mmol/L (ref 134–144)

## 2018-10-17 LAB — CBC
Hematocrit: 34.6 % (ref 34.0–46.6)
Hemoglobin: 11.6 g/dL (ref 11.1–15.9)
MCH: 28.3 pg (ref 26.6–33.0)
MCHC: 33.5 g/dL (ref 31.5–35.7)
MCV: 84 fL (ref 79–97)
Platelets: 313 10*3/uL (ref 150–450)
RBC: 4.1 x10E6/uL (ref 3.77–5.28)
RDW: 14 % (ref 11.7–15.4)
WBC: 11.9 10*3/uL — ABNORMAL HIGH (ref 3.4–10.8)

## 2018-10-17 LAB — TSH: TSH: 3.06 u[IU]/mL (ref 0.450–4.500)

## 2018-10-17 NOTE — Telephone Encounter (Signed)
-----   Message from Deberah Pelton, NP sent at 10/17/2018  8:00 AM EDT ----- Please call and tell her that her TSH is normal, her BMP is normal and her CBC is normal for her. Please have her come back in for a fasting lipid profile in the next month at her convenience. Thank you

## 2018-10-17 NOTE — Telephone Encounter (Signed)
Fasting Lipid ordered and mailed to pt

## 2018-10-21 NOTE — Progress Notes (Signed)
   Subjective: 48 year old female presenting today for follow up evaluation of a painful bunion of the right foot. She is here to update her paperwork and reschedule surgery. She reports continued pain which is exacerbated by wearing shoes and walking. She has been taking OTC pain medication for treatment. Patient is here for further evaluation and treatment.    Past Medical History:  Diagnosis Date  . Allergy   . CAD in native artery 07/16/2016  . Coronary artery disease   . Hyperlipidemia   . Hypertension   . Migraine    "4-5/week" (07/14/2016)  . NSTEMI (non-ST elevated myocardial infarction) ([place]) 07/13/2016  . S/P angioplasty with stent 07/14/16 with DES to OM2 07/16/2016  . Sleep apnea   . Thyroid nodule 07/16/2016      Objective: Physical Exam General: The patient is alert and oriented x3 in no acute distress.  Dermatology: Skin is cool, dry and supple bilateral lower extremities. Negative for open lesions or macerations.  Vascular: Palpable pedal pulses bilaterally. No edema or erythema noted. Capillary refill within normal limits.  Neurological: Epicritic and protective threshold grossly intact bilaterally.   Musculoskeletal Exam: Clinical evidence of bunion deformity noted to the respective foot. There is moderate pain on palpation range of motion of the first MPJ. Lateral deviation of the hallux noted consistent with hallux abductovalgus.  Assessment: 1. HAV w/ bunion deformity right     Plan of Care:  1. Patient was evaluated.  2. Today we discussed the conservative versus surgical management of the presenting pathology. The patient opts for surgical management. All possible complications and details of the procedure were explained. All patient questions were answered. No guarantees were expressed or implied. 3. Authorization for surgery was re-initiated today. Surgery will consist of bunionectomy with osteotomy right.  4. Return to clinic one week post op.    Got stuck  in [place] for 4 months due to [place].    Edrick Kins, DPM [facility]  Dr. Edrick Kins, [address]                                        [place], [place] [address]                Office [phone]  Fax [phone]

## 2018-10-29 ENCOUNTER — Encounter: Payer: Self-pay | Admitting: Podiatry

## 2018-11-11 ENCOUNTER — Telehealth: Payer: Self-pay | Admitting: Podiatry

## 2018-11-11 NOTE — Telephone Encounter (Signed)
Pt called wanting to know the date of her surgery. I told her she is scheduled for Thursday, 20 August. Pt then asked what time her surgery was and I told her we cannot get a time as it depends on [place]. Told pt she would get a call from them 24-48 hours before to let her know time of her surgery and what time she needed to arrive.

## 2018-11-26 ENCOUNTER — Telehealth: Payer: Self-pay | Admitting: *Deleted

## 2018-11-26 NOTE — Telephone Encounter (Signed)
DOS 11/28/2018 [place] BUNIONECTOMY RT FOOT - 27078  BCBS: Effective Date - 04/10/2018 - 04/09/2198   In-Network    Max Per Benefit Period Year-to-Date Remaining  CoInsurance  40%    Deductible  $7500.00 $6754.49  Out-Of-Pocket 3  $8150.00 $7033.72   In Network  [employer] Coinsurance Authorization Required  Not Applicable  20% per Service Year  No

## 2018-11-28 ENCOUNTER — Encounter: Payer: Self-pay | Admitting: Podiatry

## 2018-11-28 ENCOUNTER — Other Ambulatory Visit: Payer: Self-pay | Admitting: Podiatry

## 2018-11-28 DIAGNOSIS — M25571 Pain in right ankle and joints of right foot: Secondary | ICD-10-CM | POA: Diagnosis not present

## 2018-11-28 DIAGNOSIS — I10 Essential (primary) hypertension: Secondary | ICD-10-CM | POA: Diagnosis not present

## 2018-11-28 DIAGNOSIS — M2011 Hallux valgus (acquired), right foot: Secondary | ICD-10-CM | POA: Diagnosis not present

## 2018-11-28 DIAGNOSIS — M21611 Bunion of right foot: Secondary | ICD-10-CM | POA: Diagnosis not present

## 2018-11-28 MED ORDER — OXYCODONE-ACETAMINOPHEN 5-325 MG PO TABS: 1.0000 | ORAL_TABLET | Freq: Four times a day (QID) | ORAL | 0 refills | Status: DC | PRN

## 2018-11-28 NOTE — Progress Notes (Signed)
.  postop

## 2018-11-29 ENCOUNTER — Encounter: Payer: Self-pay | Admitting: Podiatry

## 2018-12-04 ENCOUNTER — Other Ambulatory Visit: Payer: BC Managed Care – PPO

## 2018-12-06 ENCOUNTER — Ambulatory Visit (INDEPENDENT_AMBULATORY_CARE_PROVIDER_SITE_OTHER): Payer: BC Managed Care – PPO

## 2018-12-06 ENCOUNTER — Encounter: Payer: Self-pay | Admitting: Podiatry

## 2018-12-06 ENCOUNTER — Other Ambulatory Visit: Payer: Self-pay

## 2018-12-06 ENCOUNTER — Ambulatory Visit (INDEPENDENT_AMBULATORY_CARE_PROVIDER_SITE_OTHER): Payer: Self-pay | Admitting: Podiatry

## 2018-12-06 VITALS — BP 126/75 | HR 62 | Temp 98.0°F

## 2018-12-06 DIAGNOSIS — M21611 Bunion of right foot: Secondary | ICD-10-CM

## 2018-12-06 DIAGNOSIS — Z9889 Other specified postprocedural states: Secondary | ICD-10-CM

## 2018-12-06 MED ORDER — ZOLPIDEM TARTRATE 5 MG PO TABS: 5.0000 mg | ORAL_TABLET | Freq: Every evening | ORAL | 1 refills | Status: DC | PRN

## 2018-12-06 MED ORDER — OXYCODONE-ACETAMINOPHEN 5-325 MG PO TABS: 1.0000 | ORAL_TABLET | Freq: Four times a day (QID) | ORAL | 0 refills | Status: DC | PRN

## 2018-12-06 MED ORDER — DOXYCYCLINE HYCLATE 100 MG PO TABS: 100.0000 mg | ORAL_TABLET | Freq: Two times a day (BID) | ORAL | 0 refills | Status: DC

## 2018-12-09 NOTE — Progress Notes (Signed)
   Subjective:  Patient presents today status post bunionectomy right. DOS: 11/28/2018. She reports moderate throbbing pain. She reports some associated nausea. She has been using the CAM boot as directed. There are no modifying factors noted. Patient is here for further evaluation and treatment.   Past Medical History:  Diagnosis Date  . Allergy   . CAD in native artery 07/16/2016  . Coronary artery disease   . Hyperlipidemia   . Hypertension   . Migraine    "4-5/week" (07/14/2016)  . NSTEMI (non-ST elevated myocardial infarction) ([facility]) 07/13/2016  . S/P angioplasty with stent 07/14/16 with DES to OM2 07/16/2016  . Sleep apnea   . Thyroid nodule 07/16/2016      Objective/Physical Exam Neurovascular status intact.  Skin incisions appear to be well coapted with sutures and staples intact. No sign of infectious process noted. No dehiscence. No active bleeding noted. Moderate edema noted to the surgical extremity.  Radiographic Exam:  Orthopedic hardware and osteotomies sites appear to be stable with routine healing.  Assessment: 1. s/p bunionectomy right. DOS: 11/28/2018   Plan of Care:  1. Patient was evaluated. X-rays reviewed 2. Dressing changed. Keep clean, dry and intact for one week.  3. Continue using CAM boot.  4. Prescription for Doxycycline #20 provided to patient.  5. Prescription for Percocet 5/325 mg provided to patient.  6. Prescription for Ambien 5 mg qhs provided to patient.  7. Return to clinic in one week.    Edrick Kins, DPM [facility]  Dr. Edrick Kins, [address]                                        [place], [place] [address]                Office [phone]  Fax [phone]

## 2018-12-11 ENCOUNTER — Other Ambulatory Visit: Payer: BC Managed Care – PPO

## 2018-12-13 ENCOUNTER — Other Ambulatory Visit: Payer: Self-pay

## 2018-12-13 ENCOUNTER — Ambulatory Visit (INDEPENDENT_AMBULATORY_CARE_PROVIDER_SITE_OTHER): Payer: BC Managed Care – PPO | Admitting: Podiatry

## 2018-12-13 ENCOUNTER — Encounter: Payer: Self-pay | Admitting: Podiatry

## 2018-12-13 DIAGNOSIS — M21611 Bunion of right foot: Secondary | ICD-10-CM | POA: Diagnosis not present

## 2018-12-13 DIAGNOSIS — Z9889 Other specified postprocedural states: Secondary | ICD-10-CM

## 2018-12-16 ENCOUNTER — Encounter: Payer: Self-pay | Admitting: Podiatry

## 2018-12-16 NOTE — Progress Notes (Signed)
   Subjective:  Patient presents today status post bunionectomy right. DOS: 11/28/2018. She states she is doing well. She denies any pain or modifying factors. She has been using the CAM boot as directed. Patient is here for further evaluation and treatment.   Past Medical History:  Diagnosis Date  . Allergy   . CAD in native artery 07/16/2016  . Coronary artery disease   . Hyperlipidemia   . Hypertension   . Migraine    "4-5/week" (07/14/2016)  . NSTEMI (non-ST elevated myocardial infarction) ([place] [facility]) 07/13/2016  . S/P angioplasty with stent 07/14/16 with DES to OM2 07/16/2016  . Sleep apnea   . Thyroid nodule 07/16/2016      Objective/Physical Exam Neurovascular status intact.  Skin incisions appear to be well coapted with sutures and staples intact. No sign of infectious process noted. No dehiscence. No active bleeding noted. Moderate edema noted to the surgical extremity.  Assessment: 1. s/p bunionectomy right. DOS: 11/28/2018   Plan of Care:  1. Patient was evaluated. 2. Sutures removed.  3. Post op shoe dispensed.  4. Return to clinic in 2 weeks for follow up X-Ray.    Edrick Kins, DPM [facility]  Dr. Edrick Kins, [address]                                        [place], [place] [address]                Office [phone]  Fax [phone]

## 2018-12-17 NOTE — Progress Notes (Signed)
DOS  11/28/2018  Bunionectomy with osteotomy right foot.

## 2018-12-19 ENCOUNTER — Telehealth: Payer: Self-pay | Admitting: *Deleted

## 2018-12-19 NOTE — Telephone Encounter (Signed)
Pt states she had bunion surgery with Dr. Amalia Hailey 3 weeks ago and her foot still have swelling and she continues to run a fever of 99.5 - 100.

## 2018-12-20 MED ORDER — DOXYCYCLINE HYCLATE 100 MG PO TABS: 100.0000 mg | ORAL_TABLET | Freq: Two times a day (BID) | ORAL | 0 refills | Status: DC

## 2018-12-20 NOTE — Telephone Encounter (Signed)
I spoke with Dr. Amalia Hailey regarding patient message and showed him patient photos through portal, He instructed to start Doxycycline, ice and elevate and if sxs get any worse to come in or go to ED      Rx for Doxy has been sent to pharmacy and patient was informed via voice mail

## 2018-12-27 ENCOUNTER — Other Ambulatory Visit: Payer: BC Managed Care – PPO

## 2018-12-31 ENCOUNTER — Other Ambulatory Visit: Payer: Self-pay | Admitting: Family Medicine

## 2018-12-31 DIAGNOSIS — I2510 Atherosclerotic heart disease of native coronary artery without angina pectoris: Secondary | ICD-10-CM

## 2019-01-03 ENCOUNTER — Ambulatory Visit (INDEPENDENT_AMBULATORY_CARE_PROVIDER_SITE_OTHER): Payer: BC Managed Care – PPO | Admitting: Podiatry

## 2019-01-03 ENCOUNTER — Other Ambulatory Visit: Payer: Self-pay

## 2019-01-03 ENCOUNTER — Ambulatory Visit (INDEPENDENT_AMBULATORY_CARE_PROVIDER_SITE_OTHER): Payer: BC Managed Care – PPO

## 2019-01-03 DIAGNOSIS — M21611 Bunion of right foot: Secondary | ICD-10-CM

## 2019-01-03 DIAGNOSIS — Z9889 Other specified postprocedural states: Secondary | ICD-10-CM

## 2019-01-05 NOTE — Progress Notes (Signed)
   Subjective:  Patient presents today status post bunionectomy right. DOS: 11/28/2018. She reports intermittent throbbing pain of the toe. She is unable to move the toe completely and normally. She has been using the CAM boot which helps alleviate the pain but she is unable to bear full weight on it. There are no worsening factors noted at this time. Patient is here for further evaluation and treatment.   Past Medical History:  Diagnosis Date  . Allergy   . CAD in native artery 07/16/2016  . Coronary artery disease   . Hyperlipidemia   . Hypertension   . Migraine    "4-5/week" (07/14/2016)  . NSTEMI (non-ST elevated myocardial infarction) ([place]) 07/13/2016  . S/P angioplasty with stent 07/14/16 with DES to OM2 07/16/2016  . Sleep apnea   . Thyroid nodule 07/16/2016      Objective/Physical Exam Neurovascular status intact.  Skin incisions appear to be well coapted. No sign of infectious process noted. No dehiscence. No active bleeding noted. Moderate edema noted to the surgical extremity.  Radiographic Exam:  Orthopedic hardware and osteotomies sites appear to be stable with routine healing.  Assessment: 1. s/p bunionectomy right. DOS: 11/28/2018   Plan of Care:  1. Patient was evaluated. X-Rays reviewed.  2. Discontinue using CAM boot.  3. Continue using post op shoe.  4. Compression anklet dispensed.  5. Return to clinic in 4 weeks.     Edrick Kins, DPM [facility]  Dr. Edrick Kins, [employer]                                        [place], [place] [address]                Office [phone]  Fax [phone]

## 2019-01-13 ENCOUNTER — Other Ambulatory Visit: Payer: Self-pay

## 2019-01-13 MED ORDER — NITROGLYCERIN 0.4 MG SL SUBL: 0.4000 mg | SUBLINGUAL_TABLET | SUBLINGUAL | 4 refills | Status: AC | PRN

## 2019-01-31 ENCOUNTER — Ambulatory Visit (INDEPENDENT_AMBULATORY_CARE_PROVIDER_SITE_OTHER): Payer: BC Managed Care – PPO | Admitting: Podiatry

## 2019-01-31 ENCOUNTER — Ambulatory Visit (INDEPENDENT_AMBULATORY_CARE_PROVIDER_SITE_OTHER): Payer: BC Managed Care – PPO

## 2019-01-31 ENCOUNTER — Other Ambulatory Visit: Payer: Self-pay

## 2019-01-31 DIAGNOSIS — Z9889 Other specified postprocedural states: Secondary | ICD-10-CM

## 2019-01-31 DIAGNOSIS — M21611 Bunion of right foot: Secondary | ICD-10-CM

## 2019-02-02 IMAGING — CT CT ANGIO CHEST
2 of 5 series · 19 of 46 positions shown · IV contrast (Omni 300)
Comparison: None.

CLINICAL DATA: 45 y/o  F; chest pain.

EXAM:
CT ANGIOGRAPHY CHEST WITH CONTRAST
TECHNIQUE: Multidetector CT imaging of the chest was performed using the
standard protocol during bolus administration of intravenous
contrast. Multiplanar CT image reconstructions and MIPs were
obtained to evaluate the vascular anatomy.
CONTRAST:  100 cc Isovue 370

[Series 5: thoracic cta 2mm · axial · 0.64mm/px · z∈[+1160,+1384]mm · 16 of 120 slices shown]
[im 4/120  lung]
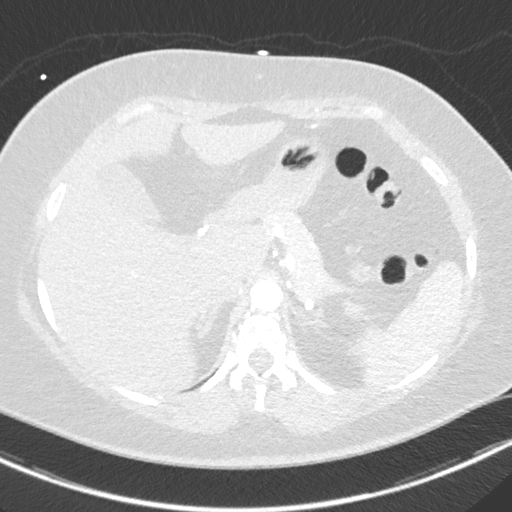
[im 12/120  soft-tissue]
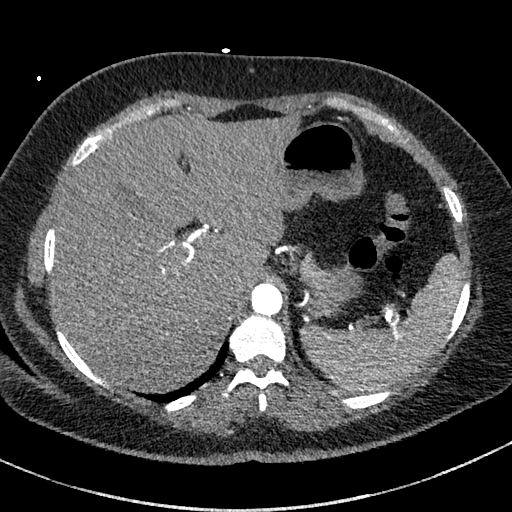
[im 20/120  lung]
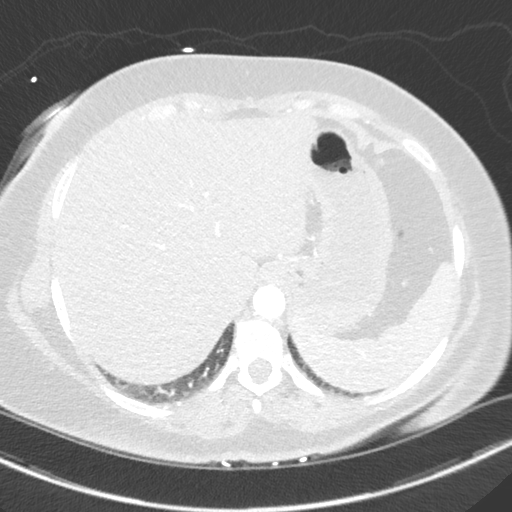
[im 27/120  soft-tissue]
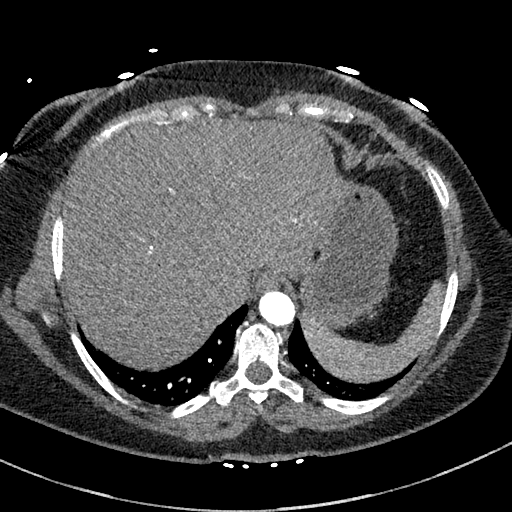
[im 35/120  lung]
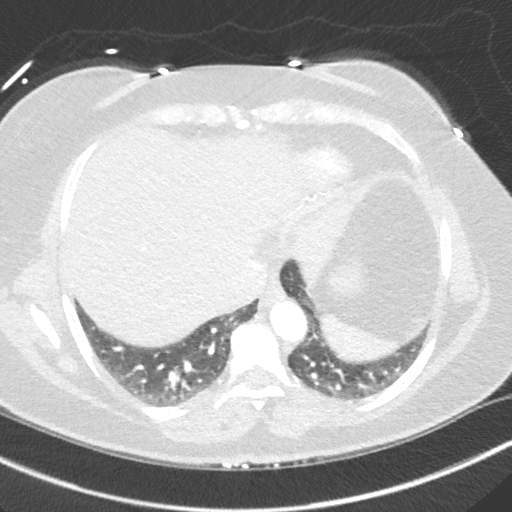
[im 43/120  soft-tissue]
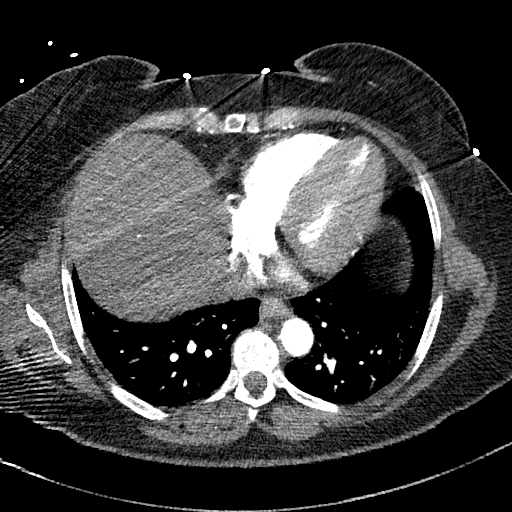
[im 50/120  lung]
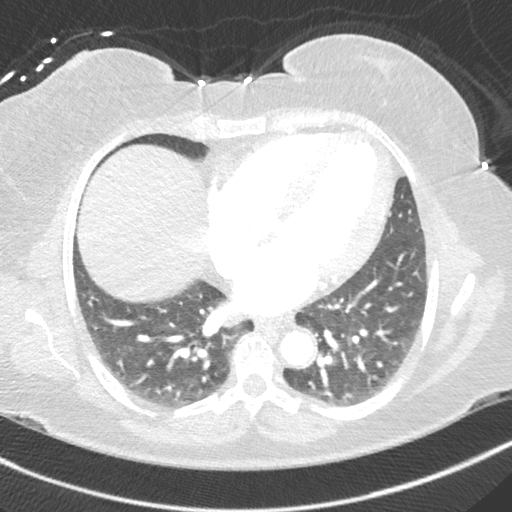
[im 58/120  soft-tissue]
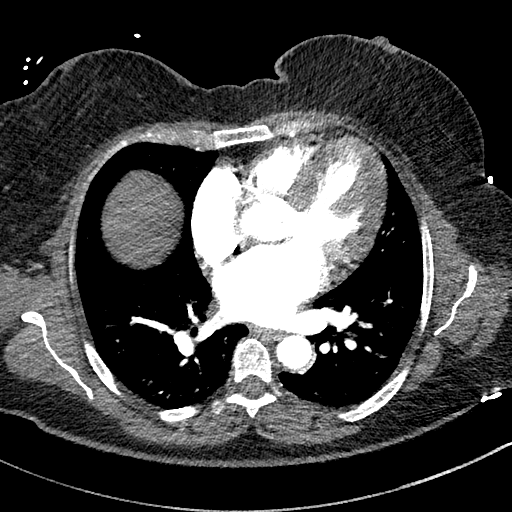
[im 62/120  lung]
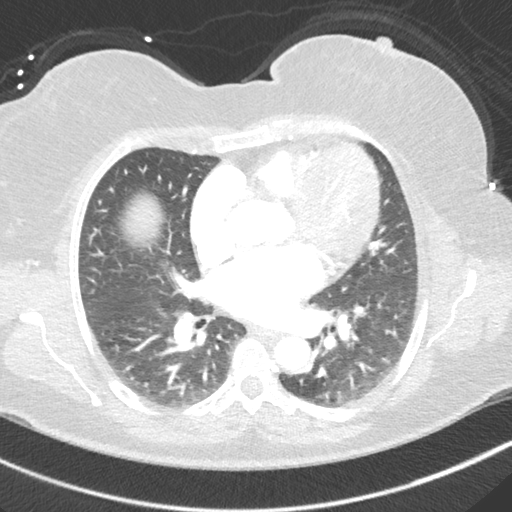
[im 70/120  soft-tissue]
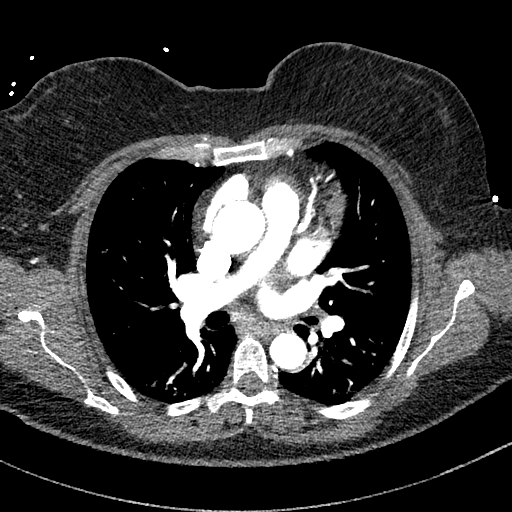
[im 77/120  lung]
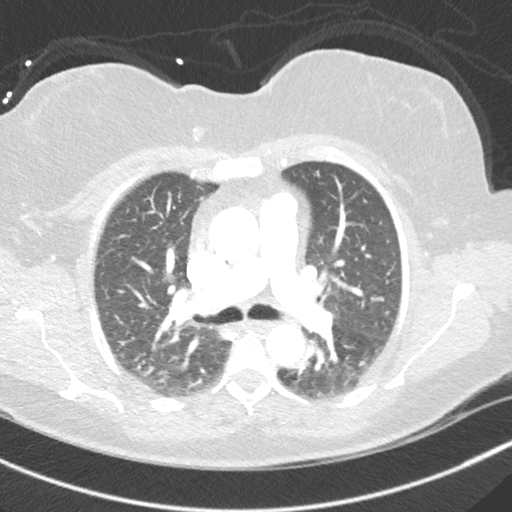
[im 85/120  soft-tissue]
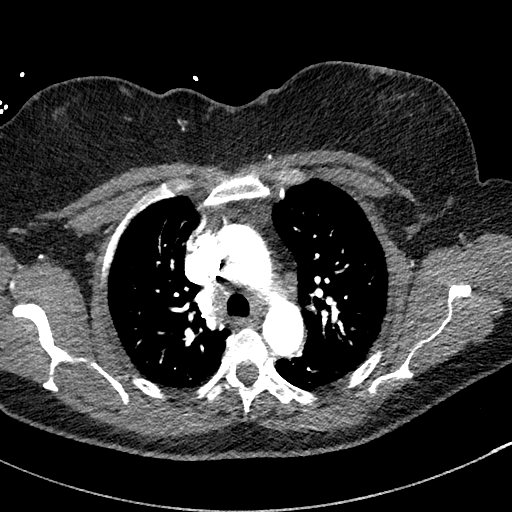
[im 93/120  lung]
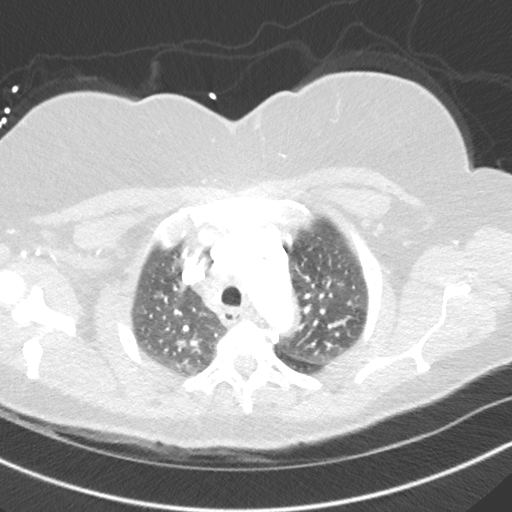
[im 100/120  soft-tissue]
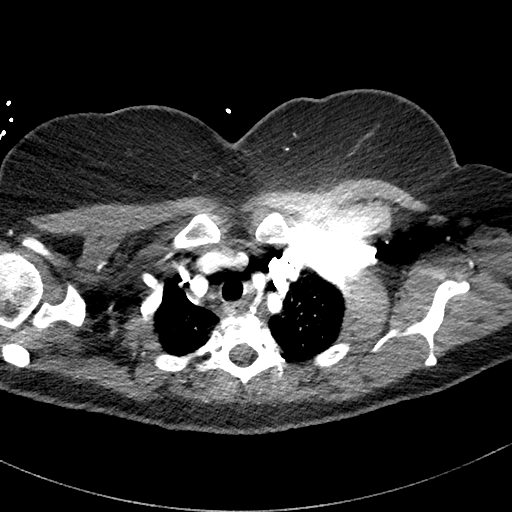
[im 108/120  lung]
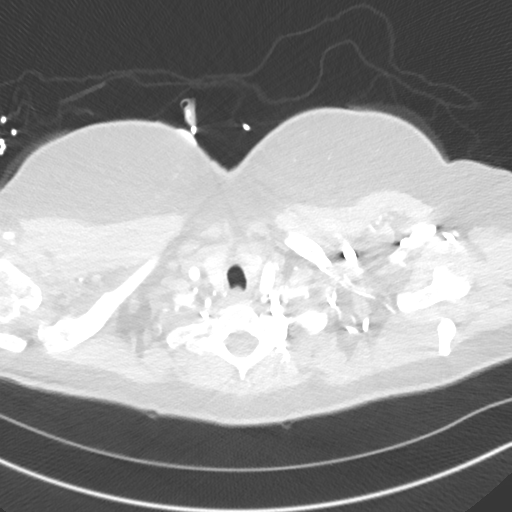
[im 116/120  soft-tissue]
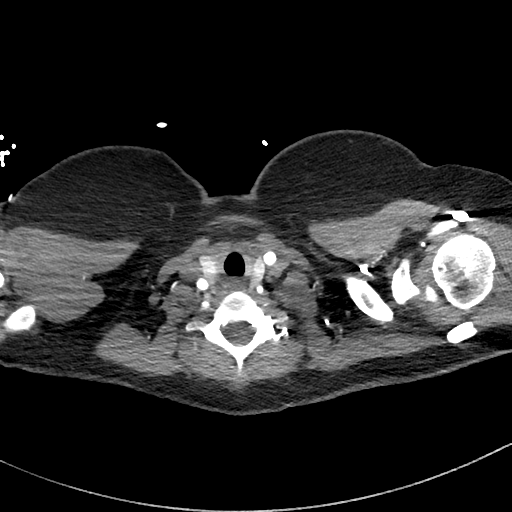

[Series 8: thoracic cta 2mm cor · coronal · 0.49mm/px · 3 of 118 slices shown]
[im 30/118  soft-tissue]
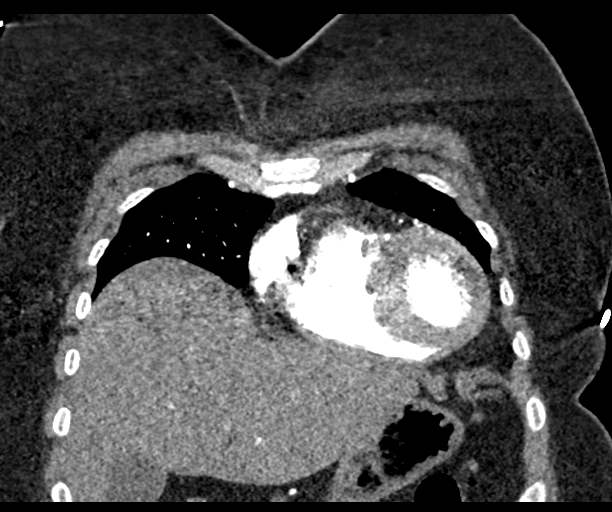
[im 59/118  soft-tissue]
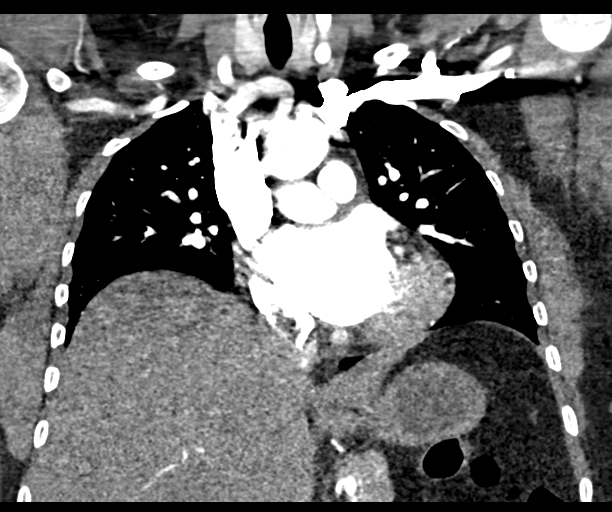
[im 88/118  soft-tissue]
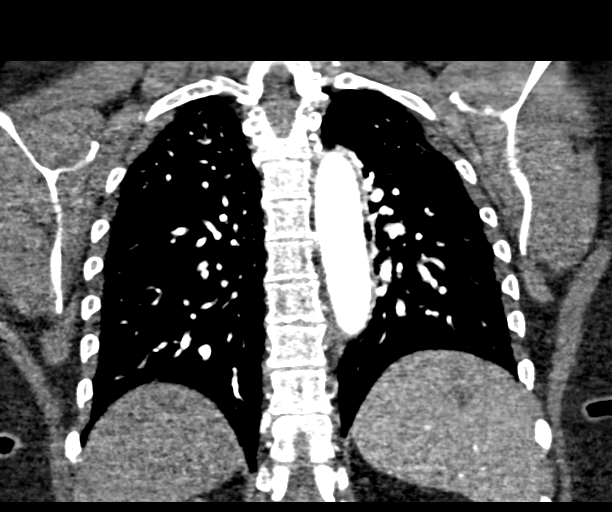

[19 of 46 positions shown; findings below may reference images not displayed]

FINDINGS: Cardiovascular: Satisfactory opacification of the pulmonary arteries
to the segmental level. No evidence of pulmonary embolism.

Mild cardiomegaly. Mild coronary artery calcification. Normal
caliber thoracic aorta and main pulmonary artery.

Mediastinum/Nodes: Right thyroid lower pole nodule measuring up to
18 mm. Normal thoracic esophagus and trachea.

Lungs/Pleura: Lungs are clear. No pleural effusion or pneumothorax.

Upper Abdomen: No acute abnormality.

Musculoskeletal: No chest wall abnormality. No acute or significant
osseous findings.

Review of the MIP images confirms the above findings.
IMPRESSION: 1. No pulmonary embolus identified.
2. Mild cardiomegaly.
3. Mild coronary artery calcification.
4. No acute pulmonary process identified.
5. 18 mm nodule in the lower pole of thyroid gland. Further
characterization with thyroid ultrasound is recommended.

By: Kwangtae Clarke M.D.

## 2019-02-03 NOTE — Progress Notes (Signed)
   Subjective:  Patient presents today status post bunionectomy right. DOS: 11/28/2018. She states she is improving. She reports some continued intermittent pain and swelling. She states she is unable to lay the toe flat. There are no modifying factors noted. She has been using the post op shoe as directed. Patient is here for further evaluation and treatment.   Past Medical History:  Diagnosis Date  . Allergy   . CAD in native artery 07/16/2016  . Coronary artery disease   . Hyperlipidemia   . Hypertension   . Migraine    "4-5/week" (07/14/2016)  . NSTEMI (non-ST elevated myocardial infarction) ([facility]) 07/13/2016  . S/P angioplasty with stent 07/14/16 with DES to OM2 07/16/2016  . Sleep apnea   . Thyroid nodule 07/16/2016      Objective/Physical Exam Neurovascular status intact.  Skin incisions appear to be well coapted. No sign of infectious process noted. No dehiscence. No active bleeding noted. Moderate edema noted to the surgical extremity.  Assessment: 1. s/p bunionectomy right. DOS: 11/28/2018   Plan of Care:  1. Patient was evaluated.  2. Physical therapy ordered for twice weekly for 6 weeks at [facility] in [place].  3. Recommended good shoe gear.  4. Discontinue using post op shoe.  5. Return to clinic in 3 months.    Edrick Kins, DPM [facility]  Dr. Edrick Kins, [address]                                        [place], [place] [address]                Office [phone]  Fax [phone]

## 2019-02-10 DIAGNOSIS — M25474 Effusion, right foot: Secondary | ICD-10-CM | POA: Diagnosis not present

## 2019-02-10 DIAGNOSIS — M25671 Stiffness of right ankle, not elsewhere classified: Secondary | ICD-10-CM | POA: Diagnosis not present

## 2019-02-10 DIAGNOSIS — M25571 Pain in right ankle and joints of right foot: Secondary | ICD-10-CM | POA: Diagnosis not present

## 2019-02-10 DIAGNOSIS — R262 Difficulty in walking, not elsewhere classified: Secondary | ICD-10-CM | POA: Diagnosis not present

## 2019-02-13 DIAGNOSIS — M25671 Stiffness of right ankle, not elsewhere classified: Secondary | ICD-10-CM | POA: Diagnosis not present

## 2019-02-13 DIAGNOSIS — M25571 Pain in right ankle and joints of right foot: Secondary | ICD-10-CM | POA: Diagnosis not present

## 2019-02-13 DIAGNOSIS — M25474 Effusion, right foot: Secondary | ICD-10-CM | POA: Diagnosis not present

## 2019-02-13 DIAGNOSIS — R262 Difficulty in walking, not elsewhere classified: Secondary | ICD-10-CM | POA: Diagnosis not present

## 2019-02-18 DIAGNOSIS — M25571 Pain in right ankle and joints of right foot: Secondary | ICD-10-CM | POA: Diagnosis not present

## 2019-02-18 DIAGNOSIS — R262 Difficulty in walking, not elsewhere classified: Secondary | ICD-10-CM | POA: Diagnosis not present

## 2019-02-18 DIAGNOSIS — M25671 Stiffness of right ankle, not elsewhere classified: Secondary | ICD-10-CM | POA: Diagnosis not present

## 2019-02-18 DIAGNOSIS — M25474 Effusion, right foot: Secondary | ICD-10-CM | POA: Diagnosis not present

## 2019-02-20 DIAGNOSIS — M25571 Pain in right ankle and joints of right foot: Secondary | ICD-10-CM | POA: Diagnosis not present

## 2019-02-20 DIAGNOSIS — M25474 Effusion, right foot: Secondary | ICD-10-CM | POA: Diagnosis not present

## 2019-02-20 DIAGNOSIS — M25671 Stiffness of right ankle, not elsewhere classified: Secondary | ICD-10-CM | POA: Diagnosis not present

## 2019-02-20 DIAGNOSIS — R262 Difficulty in walking, not elsewhere classified: Secondary | ICD-10-CM | POA: Diagnosis not present

## 2019-02-25 DIAGNOSIS — M25671 Stiffness of right ankle, not elsewhere classified: Secondary | ICD-10-CM | POA: Diagnosis not present

## 2019-02-25 DIAGNOSIS — M25474 Effusion, right foot: Secondary | ICD-10-CM | POA: Diagnosis not present

## 2019-02-25 DIAGNOSIS — R262 Difficulty in walking, not elsewhere classified: Secondary | ICD-10-CM | POA: Diagnosis not present

## 2019-02-25 DIAGNOSIS — M25571 Pain in right ankle and joints of right foot: Secondary | ICD-10-CM | POA: Diagnosis not present

## 2019-02-27 DIAGNOSIS — M25474 Effusion, right foot: Secondary | ICD-10-CM | POA: Diagnosis not present

## 2019-02-27 DIAGNOSIS — R262 Difficulty in walking, not elsewhere classified: Secondary | ICD-10-CM | POA: Diagnosis not present

## 2019-02-27 DIAGNOSIS — M25671 Stiffness of right ankle, not elsewhere classified: Secondary | ICD-10-CM | POA: Diagnosis not present

## 2019-02-27 DIAGNOSIS — M25571 Pain in right ankle and joints of right foot: Secondary | ICD-10-CM | POA: Diagnosis not present

## 2019-03-03 DIAGNOSIS — M25671 Stiffness of right ankle, not elsewhere classified: Secondary | ICD-10-CM | POA: Diagnosis not present

## 2019-03-03 DIAGNOSIS — M25571 Pain in right ankle and joints of right foot: Secondary | ICD-10-CM | POA: Diagnosis not present

## 2019-03-03 DIAGNOSIS — M25474 Effusion, right foot: Secondary | ICD-10-CM | POA: Diagnosis not present

## 2019-03-03 DIAGNOSIS — R262 Difficulty in walking, not elsewhere classified: Secondary | ICD-10-CM | POA: Diagnosis not present

## 2019-03-05 DIAGNOSIS — M25571 Pain in right ankle and joints of right foot: Secondary | ICD-10-CM | POA: Diagnosis not present

## 2019-03-05 DIAGNOSIS — R262 Difficulty in walking, not elsewhere classified: Secondary | ICD-10-CM | POA: Diagnosis not present

## 2019-03-05 DIAGNOSIS — M25671 Stiffness of right ankle, not elsewhere classified: Secondary | ICD-10-CM | POA: Diagnosis not present

## 2019-03-05 DIAGNOSIS — M25474 Effusion, right foot: Secondary | ICD-10-CM | POA: Diagnosis not present

## 2019-03-10 DIAGNOSIS — M25474 Effusion, right foot: Secondary | ICD-10-CM | POA: Diagnosis not present

## 2019-03-10 DIAGNOSIS — M25671 Stiffness of right ankle, not elsewhere classified: Secondary | ICD-10-CM | POA: Diagnosis not present

## 2019-03-10 DIAGNOSIS — M25571 Pain in right ankle and joints of right foot: Secondary | ICD-10-CM | POA: Diagnosis not present

## 2019-03-10 DIAGNOSIS — R262 Difficulty in walking, not elsewhere classified: Secondary | ICD-10-CM | POA: Diagnosis not present

## 2019-03-12 DIAGNOSIS — M25671 Stiffness of right ankle, not elsewhere classified: Secondary | ICD-10-CM | POA: Diagnosis not present

## 2019-03-12 DIAGNOSIS — M25474 Effusion, right foot: Secondary | ICD-10-CM | POA: Diagnosis not present

## 2019-03-12 DIAGNOSIS — R262 Difficulty in walking, not elsewhere classified: Secondary | ICD-10-CM | POA: Diagnosis not present

## 2019-03-12 DIAGNOSIS — M25571 Pain in right ankle and joints of right foot: Secondary | ICD-10-CM | POA: Diagnosis not present

## 2019-03-17 DIAGNOSIS — M25671 Stiffness of right ankle, not elsewhere classified: Secondary | ICD-10-CM | POA: Diagnosis not present

## 2019-03-17 DIAGNOSIS — M25474 Effusion, right foot: Secondary | ICD-10-CM | POA: Diagnosis not present

## 2019-03-17 DIAGNOSIS — M25571 Pain in right ankle and joints of right foot: Secondary | ICD-10-CM | POA: Diagnosis not present

## 2019-03-17 DIAGNOSIS — R262 Difficulty in walking, not elsewhere classified: Secondary | ICD-10-CM | POA: Diagnosis not present

## 2019-03-25 DIAGNOSIS — M25474 Effusion, right foot: Secondary | ICD-10-CM | POA: Diagnosis not present

## 2019-03-25 DIAGNOSIS — R262 Difficulty in walking, not elsewhere classified: Secondary | ICD-10-CM | POA: Diagnosis not present

## 2019-03-25 DIAGNOSIS — M25671 Stiffness of right ankle, not elsewhere classified: Secondary | ICD-10-CM | POA: Diagnosis not present

## 2019-03-25 DIAGNOSIS — M25571 Pain in right ankle and joints of right foot: Secondary | ICD-10-CM | POA: Diagnosis not present

## 2019-03-27 ENCOUNTER — Other Ambulatory Visit: Payer: Self-pay | Admitting: Cardiology

## 2019-03-27 DIAGNOSIS — M25671 Stiffness of right ankle, not elsewhere classified: Secondary | ICD-10-CM | POA: Diagnosis not present

## 2019-03-27 DIAGNOSIS — R262 Difficulty in walking, not elsewhere classified: Secondary | ICD-10-CM | POA: Diagnosis not present

## 2019-03-27 DIAGNOSIS — M25571 Pain in right ankle and joints of right foot: Secondary | ICD-10-CM | POA: Diagnosis not present

## 2019-03-27 DIAGNOSIS — M25474 Effusion, right foot: Secondary | ICD-10-CM | POA: Diagnosis not present

## 2019-03-27 DIAGNOSIS — Z95820 Peripheral vascular angioplasty status with implants and grafts: Secondary | ICD-10-CM

## 2019-03-28 ENCOUNTER — Other Ambulatory Visit: Payer: Self-pay | Admitting: Cardiology

## 2019-03-28 DIAGNOSIS — M25512 Pain in left shoulder: Secondary | ICD-10-CM | POA: Diagnosis not present

## 2019-03-28 DIAGNOSIS — M7582 Other shoulder lesions, left shoulder: Secondary | ICD-10-CM | POA: Diagnosis not present

## 2019-03-30 ENCOUNTER — Other Ambulatory Visit: Payer: Self-pay | Admitting: Physician Assistant

## 2019-03-30 DIAGNOSIS — I10 Essential (primary) hypertension: Secondary | ICD-10-CM

## 2019-03-31 DIAGNOSIS — M25671 Stiffness of right ankle, not elsewhere classified: Secondary | ICD-10-CM | POA: Diagnosis not present

## 2019-03-31 DIAGNOSIS — M25571 Pain in right ankle and joints of right foot: Secondary | ICD-10-CM | POA: Diagnosis not present

## 2019-03-31 DIAGNOSIS — M25474 Effusion, right foot: Secondary | ICD-10-CM | POA: Diagnosis not present

## 2019-03-31 DIAGNOSIS — R262 Difficulty in walking, not elsewhere classified: Secondary | ICD-10-CM | POA: Diagnosis not present

## 2019-04-02 DIAGNOSIS — M25474 Effusion, right foot: Secondary | ICD-10-CM | POA: Diagnosis not present

## 2019-04-02 DIAGNOSIS — R262 Difficulty in walking, not elsewhere classified: Secondary | ICD-10-CM | POA: Diagnosis not present

## 2019-04-02 DIAGNOSIS — M25571 Pain in right ankle and joints of right foot: Secondary | ICD-10-CM | POA: Diagnosis not present

## 2019-04-02 DIAGNOSIS — M25671 Stiffness of right ankle, not elsewhere classified: Secondary | ICD-10-CM | POA: Diagnosis not present

## 2019-04-08 DIAGNOSIS — M25571 Pain in right ankle and joints of right foot: Secondary | ICD-10-CM | POA: Diagnosis not present

## 2019-04-08 DIAGNOSIS — R262 Difficulty in walking, not elsewhere classified: Secondary | ICD-10-CM | POA: Diagnosis not present

## 2019-04-08 DIAGNOSIS — M25671 Stiffness of right ankle, not elsewhere classified: Secondary | ICD-10-CM | POA: Diagnosis not present

## 2019-04-08 DIAGNOSIS — M25474 Effusion, right foot: Secondary | ICD-10-CM | POA: Diagnosis not present

## 2019-04-10 DIAGNOSIS — M25571 Pain in right ankle and joints of right foot: Secondary | ICD-10-CM | POA: Diagnosis not present

## 2019-04-10 DIAGNOSIS — M25474 Effusion, right foot: Secondary | ICD-10-CM | POA: Diagnosis not present

## 2019-04-10 DIAGNOSIS — R262 Difficulty in walking, not elsewhere classified: Secondary | ICD-10-CM | POA: Diagnosis not present

## 2019-04-10 DIAGNOSIS — M25671 Stiffness of right ankle, not elsewhere classified: Secondary | ICD-10-CM | POA: Diagnosis not present

## 2019-04-15 DIAGNOSIS — M25474 Effusion, right foot: Secondary | ICD-10-CM | POA: Diagnosis not present

## 2019-04-15 DIAGNOSIS — R262 Difficulty in walking, not elsewhere classified: Secondary | ICD-10-CM | POA: Diagnosis not present

## 2019-04-15 DIAGNOSIS — M25671 Stiffness of right ankle, not elsewhere classified: Secondary | ICD-10-CM | POA: Diagnosis not present

## 2019-04-15 DIAGNOSIS — M25571 Pain in right ankle and joints of right foot: Secondary | ICD-10-CM | POA: Diagnosis not present

## 2019-04-17 DIAGNOSIS — M25474 Effusion, right foot: Secondary | ICD-10-CM | POA: Diagnosis not present

## 2019-04-17 DIAGNOSIS — R262 Difficulty in walking, not elsewhere classified: Secondary | ICD-10-CM | POA: Diagnosis not present

## 2019-04-17 DIAGNOSIS — M25571 Pain in right ankle and joints of right foot: Secondary | ICD-10-CM | POA: Diagnosis not present

## 2019-04-17 DIAGNOSIS — M25671 Stiffness of right ankle, not elsewhere classified: Secondary | ICD-10-CM | POA: Diagnosis not present

## 2019-04-22 DIAGNOSIS — M25671 Stiffness of right ankle, not elsewhere classified: Secondary | ICD-10-CM | POA: Diagnosis not present

## 2019-04-22 DIAGNOSIS — R262 Difficulty in walking, not elsewhere classified: Secondary | ICD-10-CM | POA: Diagnosis not present

## 2019-04-22 DIAGNOSIS — M25571 Pain in right ankle and joints of right foot: Secondary | ICD-10-CM | POA: Diagnosis not present

## 2019-04-22 DIAGNOSIS — M25474 Effusion, right foot: Secondary | ICD-10-CM | POA: Diagnosis not present

## 2019-04-24 DIAGNOSIS — R262 Difficulty in walking, not elsewhere classified: Secondary | ICD-10-CM | POA: Diagnosis not present

## 2019-04-24 DIAGNOSIS — M25474 Effusion, right foot: Secondary | ICD-10-CM | POA: Diagnosis not present

## 2019-04-24 DIAGNOSIS — M25671 Stiffness of right ankle, not elsewhere classified: Secondary | ICD-10-CM | POA: Diagnosis not present

## 2019-04-24 DIAGNOSIS — M25571 Pain in right ankle and joints of right foot: Secondary | ICD-10-CM | POA: Diagnosis not present

## 2019-04-28 DIAGNOSIS — Z20828 Contact with and (suspected) exposure to other viral communicable diseases: Secondary | ICD-10-CM | POA: Diagnosis not present

## 2019-05-02 ENCOUNTER — Encounter: Payer: Self-pay | Admitting: Family Medicine

## 2019-05-02 ENCOUNTER — Encounter: Payer: BC Managed Care – PPO | Admitting: Podiatry

## 2019-05-02 ENCOUNTER — Telehealth: Payer: BC Managed Care – PPO | Admitting: Nurse Practitioner

## 2019-05-02 DIAGNOSIS — Z20822 Contact with and (suspected) exposure to covid-19: Secondary | ICD-10-CM

## 2019-05-02 DIAGNOSIS — I252 Old myocardial infarction: Secondary | ICD-10-CM | POA: Diagnosis not present

## 2019-05-02 DIAGNOSIS — R110 Nausea: Secondary | ICD-10-CM | POA: Diagnosis not present

## 2019-05-02 DIAGNOSIS — Z955 Presence of coronary angioplasty implant and graft: Secondary | ICD-10-CM | POA: Diagnosis not present

## 2019-05-02 DIAGNOSIS — R05 Cough: Secondary | ICD-10-CM | POA: Diagnosis not present

## 2019-05-02 DIAGNOSIS — I10 Essential (primary) hypertension: Secondary | ICD-10-CM | POA: Diagnosis not present

## 2019-05-02 DIAGNOSIS — R509 Fever, unspecified: Secondary | ICD-10-CM | POA: Diagnosis not present

## 2019-05-02 DIAGNOSIS — U071 COVID-19: Secondary | ICD-10-CM | POA: Diagnosis not present

## 2019-05-02 DIAGNOSIS — Z8673 Personal history of transient ischemic attack (TIA), and cerebral infarction without residual deficits: Secondary | ICD-10-CM | POA: Diagnosis not present

## 2019-05-02 DIAGNOSIS — I2510 Atherosclerotic heart disease of native coronary artery without angina pectoris: Secondary | ICD-10-CM | POA: Diagnosis not present

## 2019-05-02 DIAGNOSIS — Z79899 Other long term (current) drug therapy: Secondary | ICD-10-CM | POA: Diagnosis not present

## 2019-05-02 DIAGNOSIS — R197 Diarrhea, unspecified: Secondary | ICD-10-CM | POA: Diagnosis not present

## 2019-05-02 DIAGNOSIS — Z9889 Other specified postprocedural states: Secondary | ICD-10-CM | POA: Diagnosis not present

## 2019-05-02 DIAGNOSIS — R438 Other disturbances of smell and taste: Secondary | ICD-10-CM | POA: Diagnosis not present

## 2019-05-02 DIAGNOSIS — Z791 Long term (current) use of non-steroidal anti-inflammatories (NSAID): Secondary | ICD-10-CM | POA: Diagnosis not present

## 2019-05-02 DIAGNOSIS — R059 Cough, unspecified: Secondary | ICD-10-CM

## 2019-05-02 MED ORDER — PROMETHAZINE-DM 6.25-15 MG/5ML PO SYRP: 5.0000 mL | ORAL_SOLUTION | Freq: Four times a day (QID) | ORAL | 0 refills | Status: AC | PRN

## 2019-05-02 NOTE — Addendum Note (Signed)
Addended by: Jackquline Berlin on: 05/02/2019 09:31 AM   Modules accepted: Orders

## 2019-05-02 NOTE — Progress Notes (Signed)
E-Visit for Corona Virus Screening  Your current symptoms could be consistent with the coronavirus.  Many health care providers can now test patients at their office but not all are.  [place] has multiple testing sites. For information on our [place] testing locations and hours go to [email or web address]  We are enrolling you in our [place] for [place] . Daily you will receive a questionnaire within the [place] website. Our COVID 19 response team will be monitoring your responses daily.  Testing Information: The COVID-19 Community Testing sites will begin testing BY APPOINTMENT ONLY.  You can schedule online at [email or web address]  If you do not have access to a smart phone or computer you may call [phone] for an appointment.   Additional testing sites in the Community:  . For [facility] Testing sites in [facility]  FaceUpdate.[email or web address]  . For Pop-up testing sites in [place]  BowlDirectory.co.uk  . For Testing sites with regular hours [email or web address]  . For [place] [email or web address]  . For [facility] [email or web address]  . For [facility] testing in [place] and [employer] [email or web address]  . For [facility] testing in [facility]   [email or web address]  For  more information about community testing call [phone]  Since you are having symptoms at this time, it is possible you tested prematurely.  I would recommend you schedule to be retested at this time. Please quarantine yourself while awaiting your test results. Please stay home for a minimum of 10  days from the first day of illness with improving symptoms and you have had 24 hours of no fever (without the use of Tylenol (Acetaminophen) Motrin (Ibuprofen) or any fever reducing medication).  Also - Do not get tested prior to returning to work because once you have had a positive test the test can stay positive for more then a month in some cases.   You should wear a mask or cloth face covering over your nose and mouth if you must be around other people or animals, including pets (even at home). Try to stay at least 6 feet away from other people. This will protect the people around you.  Please continue good preventive care measures, including:  frequent hand-washing, avoid touching your face, cover coughs/sneezes, stay out of crowds and keep a 6 foot distance from others.  COVID-19 is a respiratory illness with symptoms that are similar to the flu. Symptoms are typically mild to moderate, but there have been cases of severe illness and death due to the virus.   The following symptoms may appear 2-14 days after exposure: . Fever . Cough . Shortness of breath or difficulty breathing . Chills . Repeated shaking with chills . Muscle pain . Headache . Sore throat . New loss of taste or smell . Fatigue . Congestion or runny nose . Nausea or vomiting . Diarrhea  Go to the nearest hospital ED for assessment if fever/cough/breathlessness are severe or illness seems like a threat to life.  It is vitally important that if you feel that you have an infection such as this virus or any other virus that you stay home and away from places where you may spread it to others.  You should avoid contact with people age 60 and older.   You can use medication such as A prescription cough medication called Phenergan DM 6.25 mg/15 mg. You make take one teaspoon / 5 ml every 4-6 hours as needed for cough  You may also take acetaminophen (Tylenol)  as needed for fever.  Reduce your risk of any infection by using  the same precautions used for avoiding the common cold or flu:  Mar[place] Kitchen Wash your hands often with soap and warm water for at least 20 seconds.  If soap and water are not readily available, use an alcohol-based hand sanitizer with at least 60% alcohol.  . If coughing or sneezing, cover your mouth and nose by coughing or sneezing into the elbow areas of your shirt or coat, into a tissue or into your sleeve (not your hands). . Avoid shaking hands with others and consider head nods or verbal greetings only. . Avoid touching your eyes, nose, or mouth with unwashed hands.  . Avoid close contact with people who are sick. . Avoid places or events with large numbers of people in one location, like concerts or sporting events. . Carefully consider travel plans you have or are making. . If you are planning any travel outside or inside the [place], visit the CDC's Travelers' Health webpage for the latest health notices. . If you have some symptoms but not all symptoms, continue to monitor at home and seek medical attention if your symptoms worsen. . If you are having a medical emergency, call 911.  HOME CARE . Only take medications as instructed by your medical team. . Drink plenty of fluids and get plenty of rest. . A steam or ultrasonic humidifier can help if you have congestion.   GET HELP RIGHT AWAY IF YOU HAVE EMERGENCY WARNING SIGNS** FOR COVID-19. If you or someone is showing any of these signs seek emergency medical care immediately. Call 911 or proceed to your closest emergency facility if: . You develop worsening high fever. . Trouble breathing . Bluish lips or face . Persistent pain or pressure in the chest . New confusion . Inability to wake or stay awake . You cough up blood. . Your symptoms become more severe  **This list is not all possible symptoms. Contact your medical provider for any symptoms that are sever or concerning to you.  MAKE SURE YOU   Understand these instructions.  Will  watch your condition.  Will get help right away if you are not doing well or get worse.  Your e-visit answers were reviewed by a board certified advanced clinical practitioner to complete your personal care plan.  Depending on the condition, your plan could have included both over the counter or prescription medications.  If there is a problem please reply once you have received a response from your provider.  Your safety is important to [place].  If you have drug allergies check your prescription carefully.    You can use MyChart to ask questions about today's visit, request a non-urgent call back, or ask for a work or school excuse for 24 hours related to this e-Visit. If it has been greater than 24 hours you will need to follow up with your provider, or enter a new e-Visit to address those concerns. You will get an e-mail in the next two days asking about your experience.  I hope that your e-visit has been valuable and will speed your recovery. Thank you for using e-visits.  I have spent at least 5 minutes reviewing and documenting in the patient's chart.

## 2019-05-04 DIAGNOSIS — U071 COVID-19: Secondary | ICD-10-CM | POA: Diagnosis not present

## 2019-05-07 MED ORDER — GUAIFENESIN 100 MG/5ML PO SYRP
400.00 | ORAL_SOLUTION | ORAL | Status: DC
Start: ? — End: 2019-05-07

## 2019-05-07 MED ORDER — POTASSIUM & SODIUM PHOSPHATES 280-160-250 MG PO PACK
2.00 | PACK | ORAL | Status: DC
Start: 2019-05-07 — End: 2019-05-07

## 2019-05-07 MED ORDER — ONDANSETRON 4 MG PO TBDP
4.00 | ORAL_TABLET | ORAL | Status: DC
Start: ? — End: 2019-05-07

## 2019-05-07 MED ORDER — ASPIRIN 81 MG PO CHEW
81.00 | CHEWABLE_TABLET | ORAL | Status: DC
Start: 2019-05-08 — End: 2019-05-07

## 2019-05-07 MED ORDER — ENOXAPARIN SODIUM 40 MG/0.4ML ~~LOC~~ SOLN
40.00 | SUBCUTANEOUS | Status: DC
Start: 2019-05-07 — End: 2019-05-07

## 2019-05-07 MED ORDER — DEXAMETHASONE 4 MG PO TABS
6.00 | ORAL_TABLET | ORAL | Status: DC
Start: 2019-05-08 — End: 2019-05-07

## 2019-05-07 MED ORDER — OXYCODONE HCL 5 MG PO TABS
5.00 | ORAL_TABLET | ORAL | Status: DC
Start: 2019-05-07 — End: 2019-05-07

## 2019-05-07 MED ORDER — SENNOSIDES 8.6 MG PO TABS
1.00 | ORAL_TABLET | ORAL | Status: DC
Start: ? — End: 2019-05-07

## 2019-05-07 MED ORDER — PANTOPRAZOLE SODIUM 20 MG PO TBEC
20.00 | DELAYED_RELEASE_TABLET | ORAL | Status: DC
Start: 2019-05-08 — End: 2019-05-07

## 2019-05-07 MED ORDER — LOSARTAN POTASSIUM 25 MG PO TABS
50.00 | ORAL_TABLET | ORAL | Status: DC
Start: 2019-05-08 — End: 2019-05-07

## 2019-05-07 MED ORDER — POLYETHYLENE GLYCOL 3350 17 GM/SCOOP PO POWD
17.00 | ORAL | Status: DC
Start: ? — End: 2019-05-07

## 2019-05-07 MED ORDER — LOPERAMIDE HCL 2 MG PO CAPS
2.00 | ORAL_CAPSULE | ORAL | Status: DC
Start: ? — End: 2019-05-07

## 2019-05-07 MED ORDER — ACETAMINOPHEN 325 MG PO TABS
650.00 | ORAL_TABLET | ORAL | Status: DC
Start: ? — End: 2019-05-07

## 2019-05-07 MED ORDER — GENERIC EXTERNAL MEDICATION
100.00 | Status: DC
Start: 2019-05-08 — End: 2019-05-07

## 2019-05-07 MED ORDER — ATORVASTATIN CALCIUM 80 MG PO TABS
80.00 | ORAL_TABLET | ORAL | Status: DC
Start: 2019-05-07 — End: 2019-05-07

## 2019-05-07 MED ORDER — CARVEDILOL 25 MG PO TABS
25.00 | ORAL_TABLET | ORAL | Status: DC
Start: 2019-05-07 — End: 2019-05-07

## 2019-05-12 ENCOUNTER — Encounter: Payer: Self-pay | Admitting: Family Medicine

## 2019-05-23 ENCOUNTER — Other Ambulatory Visit: Payer: Self-pay | Admitting: Family Medicine

## 2019-05-23 ENCOUNTER — Encounter: Payer: Self-pay | Admitting: Family Medicine

## 2019-05-23 MED ORDER — BENZONATATE 100 MG PO CAPS: 100.0000 mg | ORAL_CAPSULE | Freq: Two times a day (BID) | ORAL | 0 refills | Status: DC | PRN

## 2019-06-08 ENCOUNTER — Encounter: Payer: Self-pay | Admitting: Family Medicine

## 2019-06-09 ENCOUNTER — Other Ambulatory Visit: Payer: Self-pay | Admitting: Family Medicine

## 2019-06-09 DIAGNOSIS — I2510 Atherosclerotic heart disease of native coronary artery without angina pectoris: Secondary | ICD-10-CM

## 2019-06-09 MED ORDER — HYDRALAZINE HCL 100 MG PO TABS: 100.0000 mg | ORAL_TABLET | Freq: Three times a day (TID) | ORAL | 0 refills | Status: AC

## 2019-06-09 MED ORDER — BENZONATATE 100 MG PO CAPS: 100.0000 mg | ORAL_CAPSULE | Freq: Two times a day (BID) | ORAL | 0 refills | Status: AC | PRN

## 2019-06-09 NOTE — Progress Notes (Signed)
Virtual Visit via Video Note   This visit type was conducted due to national recommendations for restrictions regarding the COVID-19 Pandemic (e.g. social distancing) in an effort to limit this patient's exposure and mitigate transmission in our community.  Due to her co-morbid illnesses, this patient is at least at moderate risk for complications without adequate follow up.  This format is felt to be most appropriate for this patient at this time.  All issues noted in this document were discussed and addressed.  A limited physical exam was performed with this format.  Please refer to the patient's chart for her consent to telehealth for [facility]. Virtual platform was offered given ongoing worsening Covid-19 pandemic.  Date:  06/12/2019   ID:  Jennifer Diaz, DOB 21-May-1970, MRN [id number]  Patient Location: Home Provider Location: [facility]  PCP:  Hoy Register, MD  Cardiologist:  Olga Millers, MD  Electrophysiologist:  None   Evaluation Performed:  Follow-Up Visit  Chief Complaint: follow up of CAD  History of Present Illness:    Jennifer Diaz is a 49 y.o. female with CAD with NSTEMI in 07/2016 s/p DES to OM2, sleep apnea, hypertension, hyperlipidemia, thyroid nodule, migraines, and recent COVID pneumonia in 04/2019 who is followed by Dr. Jens Som and presents today for follow-up.  Patient presented in 07/2016 with NSTEMI. Cardiac catheterization showed 100% stenosis of OM2 with minimal disease elsewhere. She was treated with DES to OM2 lesion. Echo showed LVEF of 60-65% with normal wall motion and diastolic function. Myoview in 04/2017 showed no evidence of ischemia. Patient was seen by Azalee Course, PA-C, in 04/2018 and reported recurrent chest pain over the last several months that would last for 30-45 seconds at a times. ETT was ordered  And showed no evidence of ischemia. She was able to complete 10.1 METS with Bruce protocol. Patient was last seen by Joni Reining, NP, in  10/2018 at which time she continued to note sporadic non-exertional chest pain as well as increased fatigue with activity. Fatigue felt to be due to deconditioning and patient was encouraged to increase physical activity.  Patient admitted in 04/2019 with acute hypoxemic respiratory failure secondary to COVID pneumonia. Treated with Remdesivir and Dexamethasone. Required 2L of supplemetnal O2 during admission but this was able to be weaned off prior to discharge.  Noted to have mild AKI with on presentation felt to be due to dehydration and renal function quickly normalized with IV fluids. She did report a syncopal episode prior to admission after standing up. She was monitored for 48 hours on telemetry with no events. This was felt to be due to orthostasis given poor oral intake and continued use of diuretics and antihypertensives. BP was borderline low during admission and most of BP medications were held. Patient was instructed to gradually restart them after discharge. She was also noted to have elevated LFTs during this admission. Etiology unclear but HIV and hepatitis work-up negative.   Patient presents today for follow-up via video visit. She notes lingering shortness of breath with activity following COVID. She also notes continued atypical chest pain that she describes as an "aching/stabbing" pain on the left side of her chest. She states it comes on suddenly and then goes away just as suddenly. Last for about 1-2 minutes, which is longer than before. Occurs both at rest and with exertion but is not worse with exertion. Pain does not radiate and is not associated with any other symptoms including diaphoresis, nausea, and vomiting. She states this is  the same pain that she has had for several years but states it is different than the acute chest pain at time of NSTEMI in 2018. No palpitations. She reports occasional brief episodes of dizziness if her systolic BP is in the 120's to 130's but states it does  not last long. No recurrent syncope. No orthopnea or PND. She does note some lower extremity edema at the end of the day that improves by morning. Patient has been monitoring BP at home. BP fluctuates from as low as 120's/60's to 170's/90's. Often in the 140's-150's/80's. BP 172/97 today. She is currently taking Coreg 25mg  twice daily, Hydralazine 100mg  three times daily, and Losartan 100mg  daily. Previously on Chlorthalidone 12.5mg  but states PCP discontinued this a while ago (cannot remember why).   Of note, patient states that since COVID, she has developed itching and "pins and needles" feeling over her body after taking the Coreg. She states she takes all of her other medications first and then waits about 1 hours and takes her Coreg. Onset of these symptoms only occur after she takes the Coreg.    Past Medical History:  Diagnosis Date  . Allergy   . CAD in native artery 07/16/2016  . Coronary artery disease   . Hyperlipidemia   . Hypertension   . Migraine    "4-5/week" (07/14/2016)  . NSTEMI (non-ST elevated myocardial infarction) (HCC) 07/13/2016  . S/P angioplasty with stent 07/14/16 with DES to OM2 07/16/2016  . Sleep apnea   . Thyroid nodule 07/16/2016   Past Surgical History:  Procedure Laterality Date  . CORONARY ANGIOPLASTY WITH STENT PLACEMENT  07/14/2016  . CORONARY STENT INTERVENTION N/A 07/14/2016   Procedure: Coronary Stent Intervention;  Surgeon: 09/15/2016, MD;  Location: [facility] INVASIVE CV LAB;  Service: Cardiovascular;  Laterality: N/A;  . essure  April 2013   GYN- Dr. Corky Crafts  . LEFT HEART CATH AND CORONARY ANGIOGRAPHY N/A 07/14/2016   Procedure: Left Heart Cath and Coronary Angiography;  Surgeon: May 2013, MD;  Location: [facility] INVASIVE CV LAB;  Service: Cardiovascular;  Laterality: N/A;     Current Meds  Medication Sig  . aspirin 81 MG chewable tablet Chew 1 tablet (81 mg total) by mouth daily.  09/13/2016 atorvastatin (LIPITOR) 80 MG tablet TAKE 1 TABLET BY MOUTH  DAILY AT 6 PM.  . benzonatate (TESSALON) 100 MG capsule Take 1 capsule (100 mg total) by mouth 2 (two) times daily as needed for cough.  . chlorthalidone (HYGROTON) 25 MG tablet Take 0.5 tablets (12.5 mg total) by mouth daily.  . hydrALAZINE (APRESOLINE) 100 MG tablet Take 1 tablet (100 mg total) by mouth 3 (three) times daily.  Corky Crafts losartan (COZAAR) 100 MG tablet Take 1 tablet (100 mg total) by mouth daily.  . nitroGLYCERIN (NITROSTAT) 0.4 MG SL tablet Place 1 tablet (0.4 mg total) under the tongue every 5 (five) minutes x 3 doses as needed for chest pain.  . pantoprazole (PROTONIX) 20 MG tablet TAKE 1 TABLET BY MOUTH EVERY DAY  . [DISCONTINUED] carvedilol (COREG) 25 MG tablet TAKE 1 TABLET BY MOUTH TWICE A DAY WITH MEALS  . [DISCONTINUED] chlorthalidone (HYGROTON) 25 MG tablet TAKE HALF A TABLET BY MOUTH EVERY DAY     Allergies:   Aleve [naproxen sodium], Lisinopril, and Norvasc [amlodipine besylate]   Social History   Tobacco Use  . Smoking status: Never Smoker  . Smokeless tobacco: Never Used  Substance Use Topics  . Alcohol use: Yes    Comment: 07/14/2016 "  maybe 2 mixed drinks/month"  . Drug use: No     Family Hx: The patient's family history includes Diabetes in her brother and mother; Heart attack in her father and mother; Heart disease in her father and mother; Hypertension in her father.  ROS:   Please see the history of present illness.    All other systems reviewed and are negative.   Prior CV studies:    The following studies were reviewed today:   Left Heart Catheterization 07/14/2016:  Mid RCA lesion, 10 %stenosed.  Dist Cx lesion, 10 %stenosed.  Mid LAD lesion, 10 %stenosed.  2nd Mrg lesion, 100 %stenosed. THis was the culprit for her NSTEMI.  A STENT PROMUS PREM MR 2.25X12 drug eluting stent was successfully placed, postdilated to 2.5 mm.  Post intervention, there is a 0% residual stenosis.  The left ventricular systolic function is normal.  LV end  diastolic pressure is mildly elevated.  The left ventricular ejection fraction is 55-65% by visual estimate.  There is no aortic valve stenosis.  LVEDP 22 mm Hg.   Continue aggressive secondary prevention.  Plan for DAPT for 12 months without interruption.  Chest pain that she had upon arrival to cath lab has resolved.   Possible discharge tomorrow.   _______________  Echocardiogram 07/15/2016: Study Conclusions: - Left ventricle: The cavity size was normal. There was mild  concentric hypertrophy. Systolic function was normal. The  estimated ejection fraction was in the range of 60% to 65%. Wall  motion was normal; there were no regional wall motion  abnormalities. Left ventricular diastolic function parameters  were normal.  - Aortic valve: There was no regurgitation.  - Aortic root: The aortic root was normal in size.  - Mitral valve: There was mild regurgitation.  - Left atrium: The atrium was normal in size.  - Right ventricle: The cavity size was normal. Wall thickness was  normal. Systolic function was normal.  - Right atrium: The atrium was normal in size.  - Tricuspid valve: There was trivial regurgitation.  - Pulmonary arteries: Systolic pressure was within the normal  range.  - Inferior vena cava: The vessel was normal in size.  - Pericardium, extracardiac: There was no pericardial effusion. _______________  Exercise Myoview 04/20/2017:  The left ventricular ejection fraction is hyperdynamic (>65%).  Nuclear stress EF: 66%.  There was no ST segment deviation noted during stress.  The study is normal.  This is a low risk study.   Low risk stress nuclear study with normal perfusion and normal left ventricular regional and global systolic function. _______________  Exercise Tolerance Test 04/26/2018:  There was no ST segment deviation noted during stress.  Pt walked for 8:02 of a Bruce protocol GXT. She achieved a peak HR of 169 which i s98%  predicted maximal HR .  At peak exercise, there were no ST or T wave changes to suggest ischemia .  BP response was normal .  Negative GXT.  Labs/Other Tests and Data Reviewed:    EKG: EKG from 04/18/2018 personally reviewed and demonstrates normal sinus rhythm, rate 77 bpm, with no significant ST/T changes. Normal axis. PR and QRS intervals normal. QTc 443 ms.  Recent Labs: 10/16/2018: BUN 7; Creatinine, Ser 0.71; Hemoglobin 11.6; Platelets 313; Potassium 4.2; Sodium 141; TSH 3.060   Recent Lipid Panel Lab Results  Component Value Date/Time   CHOL 213 (H) 04/26/2018 10:07 AM   TRIG 166 (H) 04/26/2018 10:07 AM   HDL 55 04/26/2018 10:07 AM   CHOLHDL  3.9 04/26/2018 10:07 AM   CHOLHDL 4.4 07/14/2016 01:57 AM   LDLCALC 125 (H) 04/26/2018 10:07 AM    Wt Readings from Last 3 Encounters:  10/16/18 189 lb 6.4 oz (85.9 kg)  04/18/18 190 lb (86.2 kg)  06/25/17 187 lb (84.8 kg)     Objective:    Vital Signs:  BP (!) 172/97   Pulse 68    Vital Signs Reviewed. Patient in the car during visit so unable to weigh herself.  General: No acute distress. Pulm: No labored breathing. No coughing during visit. No audible wheezing. Speaking in full sentences. Neuro: Alert and oriented. No slurred speech. Answers questions appropriately. Psych: Pleasant affect.  ASSESSMENT & PLAN:    Chronic Chest Pain with Known CAD - History of NSTEMI in 07/2016 treated with DES to OM2. Negative Myoview in 04/2017 and ETT in 04/2018.  - Continue to have atypical chest pain that she has had for years even before NSTEMI. Same pain she was having before ETT. - Will check Echo given recent COVID pneumonia with lingering dyspnea on exertion and syncopal episode.  - Continue Aspirin 81mg  daily and Lipitor 80mg  daily. Will switch from Coreg to Toprol given possible side effects.  - This seems chronic and mostly atypical. Do not think any additional ischemic work-up is needed right now. However, will assess EF and wall  motion on Echo. Patient to notify us if chest pain worsens/does not improve or if she develops any exertional symptoms.   Dyspnea with Exertion - Suspect this is likely secondary to recent COVID pneumonia and that she is still recovering.  - Will check Echo to assess EF given recent COVID diagnosis.   Syncope - Patient reports one episode of syncope prior to being admitted for COVID. Reviewed hospital notes - this was felt to be due to orthostasis due to dehydration and continued use of diuretics and antihypertensives. She was monitored on telemetry for 2 days and no arrhthymias were noted. No recurrent syncope. - It does sound like this was likely orthostatic nature. However, will check an Echo for complete work-up.   Hypertension - BP elevated at 172/97 this morning but state she has not taken her morning medications yet. BP fluctuates from as low as 120's/60's to 170's/90's. Often in the 140's-150's/80's. Patient states her primary care stopped her Chlorthalidone a while ago.  - Will restart Chlorthalidone 12.5mg  daily.  - Will stop Coreg 25mg  twice daily due to possible side effects (itching, pins and needle sensation) and start Toprol-XL 75mg  daily. Discussed dosing with PharmD.  - Continue Losartan 100mg  daily and Hydralazine 100mg  three times daily.  - Patient to keep log of BP and heart rates and will follow-up in 1 month. If BP still elevated, could considering switching from Losartan to Valsartan 320mg  daily or Omestartan 40mg  daily.  - Will recheck CMET in 1 week.   Hyperlipidemia - Lipid panel from 04/2018: Total Cholesterol 213, Triglycerides 166, HDL 55, LDL 125.  - LDL goal  <70 given CAD. - Continue Lipitor 80mg  daily.  - Will recheck fasting lipid panel when she comes in for CMET in 1 week.   Time:   Today, I have spent 17 minutes with the patient with telehealth technology discussing the above problems.     Medication Adjustments/Labs and Tests Ordered: Current medicines  are reviewed at length with the patient today.  Concerns regarding medicines are outlined above.   Follow Up:  Virtual Visit  in 1 month(s) with me.  Signed,  Corrin Parker, PA-C  06/12/2019 12:25 PM    [facility]

## 2019-06-12 ENCOUNTER — Encounter: Payer: Self-pay | Admitting: Student

## 2019-06-12 ENCOUNTER — Telehealth (INDEPENDENT_AMBULATORY_CARE_PROVIDER_SITE_OTHER): Payer: BLUE CROSS/BLUE SHIELD | Admitting: Student

## 2019-06-12 VITALS — BP 172/97 | HR 68

## 2019-06-12 DIAGNOSIS — R0602 Shortness of breath: Secondary | ICD-10-CM

## 2019-06-12 DIAGNOSIS — R55 Syncope and collapse: Secondary | ICD-10-CM | POA: Diagnosis not present

## 2019-06-12 DIAGNOSIS — E785 Hyperlipidemia, unspecified: Secondary | ICD-10-CM

## 2019-06-12 DIAGNOSIS — Z79899 Other long term (current) drug therapy: Secondary | ICD-10-CM | POA: Diagnosis not present

## 2019-06-12 MED ORDER — METOPROLOL SUCCINATE ER 50 MG PO TB24: ORAL_TABLET | ORAL | 3 refills | Status: AC

## 2019-06-12 MED ORDER — CHLORTHALIDONE 25 MG PO TABS: 12.5000 mg | ORAL_TABLET | Freq: Every day | ORAL | 3 refills | Status: AC

## 2019-06-12 NOTE — Patient Instructions (Addendum)
Medication Instructions:  Marjie Skiff, PA has recommended making the following medication changes: 1. RESTART Chlorthalidone 0.5 tablet (12.5 mg total) daily 2. STOP Carvedilol 3. START Metoprolol Succinate 1.5 tablets (75 mg total) daily  Please keep a record of your blood pressure and heart rate - will review at your next appointment.  *If you need a refill on your cardiac medications before your next appointment, please call your pharmacy*   Testing/Procedures: Your physician has requested that you have an echocardiogram in 1 week. Echocardiography is a painless test that uses sound waves to create images of your heart. It provides your doctor with information about the size and shape of your heart and how well your heart's chambers and valves are working. This procedure takes approximately one hour. There are no restrictions for this procedure.  >> This will be performed at our [facility] location [address] [place], [address] [place] [address] [phone]   Lab Work: Your physician recommends that you return for lab work in 1 week - fasting. Please have your labs done on the same day as your echocardiogram.  If you have labs (blood work) drawn today and your tests are completely normal, you will receive your results only by: Marland Kitchen MyChart Message (if you have MyChart) OR . A paper copy in the mail If you have any lab test that is abnormal or we need to change your treatment, we will call you to review the results.   Follow-Up: At [facility], you and your health needs are our priority.  As part of our continuing mission to provide you with exceptional heart care, we have created designated Provider Care Teams.  These Care Teams include your primary Cardiologist (physician) and Advanced Practice Providers (APPs -  Physician Assistants and Nurse Practitioners) who all work together to provide you with the care you need, when you need it.  We recommend signing up for the patient  portal called "MyChart".  Sign up information is provided on this After Visit Summary.  MyChart is used to connect with patients for Virtual Visits (Telemedicine).  Patients are able to view lab/test results, encounter notes, upcoming appointments, etc.  Non-urgent messages can be sent to your provider as well.   To learn more about what you can do with MyChart, go to [email or web address].    Your next appointment:   1 month(s)  The format for your next appointment:   Virtual Visit   Provider:   You may see Marjie Skiff, PA-C.

## 2019-06-27 ENCOUNTER — Ambulatory Visit (HOSPITAL_COMMUNITY): Payer: BLUE CROSS/BLUE SHIELD | Attending: Internal Medicine

## 2019-06-27 ENCOUNTER — Other Ambulatory Visit: Payer: Self-pay

## 2019-06-27 ENCOUNTER — Other Ambulatory Visit: Payer: BLUE CROSS/BLUE SHIELD | Admitting: *Deleted

## 2019-06-27 DIAGNOSIS — R0602 Shortness of breath: Secondary | ICD-10-CM | POA: Diagnosis present

## 2019-06-27 DIAGNOSIS — R55 Syncope and collapse: Secondary | ICD-10-CM | POA: Insufficient documentation

## 2019-06-27 LAB — COMPREHENSIVE METABOLIC PANEL
ALT: 40 IU/L — ABNORMAL HIGH (ref 0–32)
AST: 30 IU/L (ref 0–40)
Albumin/Globulin Ratio: 1.7 (ref 1.2–2.2)
Albumin: 4.3 g/dL (ref 3.8–4.8)
Alkaline Phosphatase: 113 IU/L (ref 39–117)
BUN/Creatinine Ratio: 17 (ref 9–23)
BUN: 11 mg/dL (ref 6–24)
Bilirubin Total: 0.3 mg/dL (ref 0.0–1.2)
CO2: 25 mmol/L (ref 20–29)
Calcium: 9.4 mg/dL (ref 8.7–10.2)
Chloride: 99 mmol/L (ref 96–106)
Creatinine, Ser: 0.66 mg/dL (ref 0.57–1.00)
GFR calc Af Amer: 119 mL/min/{1.73_m2} (ref >59)
GFR calc non Af Amer: 103 mL/min/{1.73_m2} (ref >59)
Globulin, Total: 2.6 g/dL (ref 1.5–4.5)
Glucose: 118 mg/dL — ABNORMAL HIGH (ref 65–99)
Potassium: 3.4 mmol/L — ABNORMAL LOW (ref 3.5–5.2)
Sodium: 139 mmol/L (ref 134–144)
Total Protein: 6.9 g/dL (ref 6.0–8.5)

## 2019-06-27 LAB — LIPID PANEL
Chol/HDL Ratio: 3.3 ratio (ref 0.0–4.4)
Cholesterol, Total: 187 mg/dL (ref 100–199)
HDL: 56 mg/dL (ref >39)
LDL Chol Calc (NIH): 103 mg/dL — ABNORMAL HIGH (ref 0–99)
Triglycerides: 162 mg/dL — ABNORMAL HIGH (ref 0–149)
VLDL Cholesterol Cal: 28 mg/dL (ref 5–40)

## 2019-06-30 MED ORDER — EZETIMIBE 10 MG PO TABS: 10.0000 mg | ORAL_TABLET | Freq: Every day | ORAL | 3 refills | Status: DC

## 2019-06-30 MED ORDER — POTASSIUM CHLORIDE CRYS ER 10 MEQ PO TBCR: 10.0000 meq | EXTENDED_RELEASE_TABLET | Freq: Two times a day (BID) | ORAL | 3 refills | Status: DC

## 2019-07-01 ENCOUNTER — Other Ambulatory Visit: Payer: Self-pay | Admitting: Cardiology

## 2019-07-04 ENCOUNTER — Other Ambulatory Visit: Payer: Self-pay | Admitting: Physician Assistant

## 2019-07-04 DIAGNOSIS — I10 Essential (primary) hypertension: Secondary | ICD-10-CM

## 2019-07-07 ENCOUNTER — Other Ambulatory Visit: Payer: Self-pay | Admitting: Medical

## 2019-07-11 ENCOUNTER — Telehealth: Payer: Self-pay | Admitting: Student

## 2019-07-11 NOTE — Telephone Encounter (Signed)
-----   Message from Brayton El sent at 06/30/2019  8:34 AM EDT ----- Regarding: FW: refer to lpharmacist for lipid  ----- Message ----- From: Alyson Ingles, LPN Sent: 12/28/1658   8:31 AM EDT To: Cv Div Nl Scheduling Subject: refer to lpharmacist for lipid                 Please call pt and schedule appt.  Thank you!! Marcelino Duster

## 2019-07-11 NOTE — Telephone Encounter (Signed)
A referral to see PharmD for lipids was placed.  When I called the patient she informed me she lives two hours away and would like to do a virtual appt.  Would this be possible or does she need to see the MD? Please advise.  Thank you.

## 2019-07-11 NOTE — Telephone Encounter (Signed)
Yes, virtual would be ok. This is a NL patient, but if their pharmD do not have room on schedule, can schedule virtual on [address] schedule.

## 2019-07-24 NOTE — Progress Notes (Signed)
HPI: FU coronary artery disease. Admitted April 2018 and ruled in for a non-ST elevation myocardial infarction. CTA showed no pulmonary embolus. There was a thyroid nodule. Thyroid ultrasound showed benign mixed cystic and solid nodule in the right mid gland and no further follow-up recommended. Cardiac catheterization revealed an occluded second marginal but no other obstructive disease noted. LV function normal. Patient had PCI of her obtuse marginal with drug-eluting stent. Nuclear study 1/19 showed EF 66 and normal perfusion.   Exercise treadmill January 2020 showed no ST changes and normal blood pressure response.  Patient had Covid January 2021.  Also with syncope that was felt to be orthostatic in nature.  Echocardiogram March 2021 showed normal LV function, mild left ventricular hypertrophy, mildly elevated pulmonary pressure.  Since last seen,she notes dyspnea on exertion since contracting Covid.  Symptoms are slowly improving.  No orthopnea, PND, pedal edema, exertional chest pain or syncope.  Occasional palpitations.  Current Outpatient Medications  Medication Sig Dispense Refill  . acetaminophen (TYLENOL) 500 MG tablet Take by mouth.    Marland Kitchen aspirin 81 MG chewable tablet Chew 1 tablet (81 mg total) by mouth daily.    Marland Kitchen atorvastatin (LIPITOR) 80 MG tablet TAKE 1 TABLET BY MOUTH DAILY AT 6 PM. 90 tablet 0  . benzonatate (TESSALON) 100 MG capsule Take 1 capsule (100 mg total) by mouth 2 (two) times daily as needed for cough. 20 capsule 0  . carvedilol (COREG) 25 MG tablet Take by mouth.    . chlorthalidone (HYGROTON) 25 MG tablet Take 0.5 tablets (12.5 mg total) by mouth daily. 45 tablet 3  . COVID-19 Specimen Collection KIT See admin instructions. for testing    . ezetimibe (ZETIA) 10 MG tablet Take 1 tablet (10 mg total) by mouth daily. 30 tablet 3  . hydrALAZINE (APRESOLINE) 100 MG tablet Take 1 tablet (100 mg total) by mouth 3 (three) times daily. 270 tablet 0  . ibuprofen (ADVIL)  600 MG tablet Take 600 mg by mouth 3 (three) times daily.    Marland Kitchen losartan (COZAAR) 100 MG tablet TAKE 1 TABLET BY MOUTH EVERY DAY 90 tablet 3  . metoprolol succinate (TOPROL-XL) 50 MG 24 hr tablet Take 1.5 tablets (75 mg total) by mouth daily. Take with or immediately following a meal. 135 tablet 3  . nitroGLYCERIN (NITROSTAT) 0.4 MG SL tablet Place 1 tablet (0.4 mg total) under the tongue every 5 (five) minutes x 3 doses as needed for chest pain. 25 tablet 4  . ondansetron (ZOFRAN-ODT) 4 MG disintegrating tablet     . oxyCODONE (OXY IR/ROXICODONE) 5 MG immediate release tablet     . pantoprazole (PROTONIX) 20 MG tablet TAKE 1 TABLET BY MOUTH EVERY DAY 90 tablet 3  . potassium chloride (KLOR-CON M10) 10 MEQ tablet Take 1 tablet (10 mEq total) by mouth 2 (two) times daily. 30 tablet 3   No current facility-administered medications for this visit.     Past Medical History:  Diagnosis Date  . Allergy   . CAD in native artery 07/16/2016  . Coronary artery disease   . Hyperlipidemia   . Hypertension   . Migraine    "4-5/week" (07/14/2016)  . NSTEMI (non-ST elevated myocardial infarction) ([place]) 07/13/2016  . S/P angioplasty with stent 07/14/16 with DES to OM2 07/16/2016  . Sleep apnea   . Thyroid nodule 07/16/2016    Past Surgical History:  Procedure Laterality Date  . CORONARY ANGIOPLASTY WITH STENT PLACEMENT  07/14/2016  . CORONARY  STENT INTERVENTION N/A 07/14/2016   Procedure: Coronary Stent Intervention;  Surgeon: Jettie Booze, MD;  Location: [facility] CV LAB;  Service: Cardiovascular;  Laterality: N/A;  . essure  April 2013   GYN- Dr. Valentino Saxon  . LEFT HEART CATH AND CORONARY ANGIOGRAPHY N/A 07/14/2016   Procedure: Left Heart Cath and Coronary Angiography;  Surgeon: Jettie Booze, MD;  Location: [facility] CV LAB;  Service: Cardiovascular;  Laterality: N/A;    Social History   Socioeconomic History  . Marital status: Married    Spouse name: Not on file  . Number of children:  Not on file  . Years of education: Not on file  . Highest education level: Not on file  Occupational History  . Not on file  Tobacco Use  . Smoking status: Never Smoker  . Smokeless tobacco: Never Used  Substance and Sexual Activity  . Alcohol use: Yes    Comment: 07/14/2016 "maybe 2 mixed drinks/month"  . Drug use: No  . Sexual activity: Yes    Birth control/protection: I.U.D.  Other Topics Concern  . Not on file  Social History Narrative  . Not on file   Social Determinants of Health   Financial Resource Strain:   . Difficulty of Paying Living Expenses:   Food Insecurity:   . Worried About Charity fundraiser in the Last Year:   . Arboriculturist in the Last Year:   Transportation Needs:   . Film/video editor (Medical):   Marland Kitchen Lack of Transportation (Non-Medical):   Physical Activity:   . Days of Exercise per Week:   . Minutes of Exercise per Session:   Stress:   . Feeling of Stress :   Social Connections:   . Frequency of Communication with Friends and Family:   . Frequency of Social Gatherings with Friends and Family:   . Attends Religious Services:   . Active Member of Clubs or Organizations:   . Attends Archivist Meetings:   Marland Kitchen Marital Status:   Intimate Partner Violence:   . Fear of Current or Ex-Partner:   . Emotionally Abused:   Marland Kitchen Physically Abused:   . Sexually Abused:     Family History  Problem Relation Age of Onset  . Diabetes Mother   . Heart attack Mother   . Heart disease Mother   . Hypertension Father   . Heart attack Father   . Heart disease Father   . Diabetes Brother     ROS: no fevers or chills, productive cough, hemoptysis, dysphasia, odynophagia, melena, hematochezia, dysuria, hematuria, rash, seizure activity, orthopnea, PND, pedal edema, claudication. Remaining systems are negative.  Physical Exam: Well-developed well-nourished in no acute distress.  Skin is warm and dry.  HEENT is normal.  Neck is supple.  Chest is  clear to auscultation with normal expansion.  Cardiovascular exam is regular rate and rhythm.  Abdominal exam nontender or distended. No masses palpated. Extremities show no edema. neuro grossly intact  ECG-sinus tachycardia with no ST changes.  Personally reviewed  A/P  1 coronary artery disease-patient doing well with no chest pain.  Plan to continue medical therapy with aspirin, statin and metoprolol.  2 hypertension-blood pressure borderline.  We will follow and increase medications as needed.  3 hyperlipidemia-continue Zetia and statin.  4 recent syncope-occurred in the setting of Covid and felt to be orthostatic mediated.  LV function normal.  No recurrences.  5 palpitations-we will consider a monitor in the future if her  symptoms worsen.  She feels as though her heart is racing at times.  Check TSH and hemoglobin.  Kirk Ruths, MD

## 2019-07-28 ENCOUNTER — Ambulatory Visit: Payer: Self-pay | Admitting: Cardiology

## 2019-07-28 ENCOUNTER — Encounter: Payer: Self-pay | Admitting: Cardiology

## 2019-07-28 ENCOUNTER — Telehealth: Payer: Self-pay | Admitting: Student

## 2019-07-28 ENCOUNTER — Other Ambulatory Visit: Payer: Self-pay

## 2019-07-28 ENCOUNTER — Telehealth: Payer: BLUE CROSS/BLUE SHIELD | Admitting: Student

## 2019-07-28 VITALS — BP 142/80 | HR 120 | Temp 97.8°F | Resp 18 | Ht 61.0 in | Wt 192.8 lb

## 2019-07-28 DIAGNOSIS — I10 Essential (primary) hypertension: Secondary | ICD-10-CM

## 2019-07-28 DIAGNOSIS — E7800 Pure hypercholesterolemia, unspecified: Secondary | ICD-10-CM

## 2019-07-28 DIAGNOSIS — I2510 Atherosclerotic heart disease of native coronary artery without angina pectoris: Secondary | ICD-10-CM

## 2019-07-28 NOTE — Patient Instructions (Signed)
Medication Instructions:  NO CHANGE *If you need a refill on your cardiac medications before your next appointment, please call your pharmacy*   Lab Work: Your physician recommends that you HAVE LAB WORK TODAY If you have labs (blood work) drawn today and your tests are completely normal, you will receive your results only by: . MyChart Message (if you have MyChart) OR . A paper copy in the mail If you have any lab test that is abnormal or we need to change your treatment, we will call you to review the results.   Follow-Up: At [facility], you and your health needs are our priority.  As part of our continuing mission to provide you with exceptional heart care, we have created designated Provider Care Teams.  These Care Teams include your primary Cardiologist (physician) and Advanced Practice Providers (APPs -  Physician Assistants and Nurse Practitioners) who all work together to provide you with the care you need, when you need it.  We recommend signing up for the patient portal called "MyChart".  Sign up information is provided on this After Visit Summary.  MyChart is used to connect with patients for Virtual Visits (Telemedicine).  Patients are able to view lab/test results, encounter notes, upcoming appointments, etc.  Non-urgent messages can be sent to your provider as well.   To learn more about what you can do with MyChart, go to [email or web address].    Your next appointment:   6 month(s)  The format for your next appointment:   Either In Person or Virtual  Provider:   You may see Brian Crenshaw, MD or one of the following Advanced Practice Providers on your designated Care Team:    Luke Kilroy, PA-C  Callie Goodrich, PA-C  Jesse Cleaver, FNP     

## 2019-07-28 NOTE — Telephone Encounter (Signed)
appt for 4/20 has been canceled, patient is aware.

## 2019-07-28 NOTE — Telephone Encounter (Signed)
-----   Message from Raquel Rodriguez-Guzman, RPH-CPP sent at 07/28/2019  7:47 AM EDT ----- Regarding: RE: Re-schedule I will have another patient from 2:00 to 2:30pm we may be able to talk to her for few minutes but can not have a full office visit at that time.   Please cancel the virtual visit for 4/20- we are not able to complete it.  Raquel ----- Message ----- From: Lorie Phenix Sent: 07/25/2019   9:08 AM EDT To: Pearletha Furl, RPH Subject: RE: Re-schedule                                Patient lives over two hours away she has an appt on 4/19 at 2:20p with Dr. [place].  Is there anyway you can see her that day?  Thank you.  ----- Message ----- From: Pearletha Furl, RPH Sent: 07/24/2019   4:35 PM EDT To: Cv Div Nl Scheduling Subject: Re-schedule                                    Pharmacists can not perform video visits for lipid clinic. Visit has to be face to face. Please re-schedule patient accordantly.  Raquel Rodriguez-Guzman PharmD, BCPS, CPP [facility] [address] [place] [address] 07/24/2019 4:37 PM

## 2019-07-29 ENCOUNTER — Telehealth: Payer: Self-pay

## 2019-07-29 LAB — CBC
Hematocrit: 41.5 % (ref 34.0–46.6)
Hemoglobin: 13.9 g/dL (ref 11.1–15.9)
MCH: 28.2 pg (ref 26.6–33.0)
MCHC: 33.5 g/dL (ref 31.5–35.7)
MCV: 84 fL (ref 79–97)
Platelets: 372 10*3/uL (ref 150–450)
RBC: 4.93 x10E6/uL (ref 3.77–5.28)
RDW: 13.4 % (ref 11.7–15.4)
WBC: 12.6 10*3/uL — ABNORMAL HIGH (ref 3.4–10.8)

## 2019-07-29 LAB — TSH: TSH: 2.29 u[IU]/mL (ref 0.450–4.500)

## 2019-09-03 ENCOUNTER — Other Ambulatory Visit: Payer: Self-pay | Admitting: Family Medicine

## 2019-09-03 DIAGNOSIS — I2510 Atherosclerotic heart disease of native coronary artery without angina pectoris: Secondary | ICD-10-CM

## 2019-09-21 ENCOUNTER — Other Ambulatory Visit: Payer: Self-pay | Admitting: Medical

## 2019-09-30 ENCOUNTER — Other Ambulatory Visit: Payer: Self-pay | Admitting: Medical

## 2019-10-07 ENCOUNTER — Other Ambulatory Visit: Payer: Self-pay | Admitting: Family Medicine

## 2019-10-07 DIAGNOSIS — I2510 Atherosclerotic heart disease of native coronary artery without angina pectoris: Secondary | ICD-10-CM
